# Patient Record
Sex: Male | Born: 1945 | Race: White | Hispanic: No | Smoking: Former smoker
Health system: Southern US, Community
[De-identification: ages and names within clinical notes are randomized; demographics above are authoritative.]

## PROBLEM LIST (undated history)

## (undated) DIAGNOSIS — R05 Cough: Secondary | ICD-10-CM

## (undated) DIAGNOSIS — K769 Liver disease, unspecified: Secondary | ICD-10-CM

## (undated) DIAGNOSIS — R499 Unspecified voice and resonance disorder: Secondary | ICD-10-CM

## (undated) DIAGNOSIS — N529 Male erectile dysfunction, unspecified: Secondary | ICD-10-CM

## (undated) DIAGNOSIS — J189 Pneumonia, unspecified organism: Secondary | ICD-10-CM

## (undated) DIAGNOSIS — R911 Solitary pulmonary nodule: Secondary | ICD-10-CM

## (undated) DIAGNOSIS — M1990 Unspecified osteoarthritis, unspecified site: Secondary | ICD-10-CM

## (undated) DIAGNOSIS — I10 Essential (primary) hypertension: Secondary | ICD-10-CM

## (undated) DIAGNOSIS — I480 Paroxysmal atrial fibrillation: Secondary | ICD-10-CM

## (undated) DIAGNOSIS — F329 Major depressive disorder, single episode, unspecified: Secondary | ICD-10-CM

## (undated) DIAGNOSIS — D369 Benign neoplasm, unspecified site: Secondary | ICD-10-CM

## (undated) DIAGNOSIS — R0981 Nasal congestion: Secondary | ICD-10-CM

## (undated) DIAGNOSIS — I2510 Atherosclerotic heart disease of native coronary artery without angina pectoris: Secondary | ICD-10-CM

## (undated) DIAGNOSIS — F32A Depression, unspecified: Secondary | ICD-10-CM

## (undated) DIAGNOSIS — Z9889 Other specified postprocedural states: Secondary | ICD-10-CM

## (undated) DIAGNOSIS — R059 Cough, unspecified: Secondary | ICD-10-CM

## (undated) DIAGNOSIS — E785 Hyperlipidemia, unspecified: Secondary | ICD-10-CM

## (undated) DIAGNOSIS — R112 Nausea with vomiting, unspecified: Secondary | ICD-10-CM

## (undated) DIAGNOSIS — E079 Disorder of thyroid, unspecified: Secondary | ICD-10-CM

## (undated) DIAGNOSIS — E039 Hypothyroidism, unspecified: Secondary | ICD-10-CM

## (undated) HISTORY — DX: Depression, unspecified: F32.A

## (undated) HISTORY — DX: Liver disease, unspecified: K76.9

## (undated) HISTORY — DX: Atherosclerotic heart disease of native coronary artery without angina pectoris: I25.10

## (undated) HISTORY — DX: Essential (primary) hypertension: I10

## (undated) HISTORY — DX: Major depressive disorder, single episode, unspecified: F32.9

## (undated) HISTORY — DX: Unspecified osteoarthritis, unspecified site: M19.90

## (undated) HISTORY — DX: Cough, unspecified: R05.9

## (undated) HISTORY — DX: Unspecified voice and resonance disorder: R49.9

## (undated) HISTORY — DX: Pneumonia, unspecified organism: J18.9

## (undated) HISTORY — DX: Hyperlipidemia, unspecified: E78.5

## (undated) HISTORY — DX: Male erectile dysfunction, unspecified: N52.9

## (undated) HISTORY — DX: Benign neoplasm, unspecified site: D36.9

## (undated) HISTORY — DX: Solitary pulmonary nodule: R91.1

## (undated) HISTORY — DX: Cough: R05

## (undated) HISTORY — DX: Nasal congestion: R09.81

## (undated) HISTORY — DX: Disorder of thyroid, unspecified: E07.9

---

## 1997-09-04 ENCOUNTER — Encounter: Admission: RE | Admit: 1997-09-04 | Discharge: 1997-09-04 | Payer: Self-pay | Admitting: *Deleted

## 1997-12-08 ENCOUNTER — Emergency Department (HOSPITAL_COMMUNITY): Admission: EM | Admit: 1997-12-08 | Discharge: 1997-12-09 | Payer: Self-pay | Admitting: Emergency Medicine

## 1997-12-08 ENCOUNTER — Encounter: Payer: Self-pay | Admitting: Emergency Medicine

## 1999-08-03 ENCOUNTER — Encounter: Payer: Self-pay | Admitting: Family Medicine

## 1999-08-03 ENCOUNTER — Encounter: Admission: RE | Admit: 1999-08-03 | Discharge: 1999-08-03 | Payer: Self-pay | Admitting: Family Medicine

## 2001-08-19 ENCOUNTER — Ambulatory Visit (HOSPITAL_COMMUNITY): Admission: RE | Admit: 2001-08-19 | Discharge: 2001-08-19 | Payer: Self-pay | Admitting: Family Medicine

## 2001-08-19 ENCOUNTER — Encounter: Payer: Self-pay | Admitting: Family Medicine

## 2002-11-03 ENCOUNTER — Emergency Department (HOSPITAL_COMMUNITY): Admission: EM | Admit: 2002-11-03 | Discharge: 2002-11-03 | Payer: Self-pay | Admitting: Emergency Medicine

## 2003-01-12 ENCOUNTER — Encounter: Admission: RE | Admit: 2003-01-12 | Discharge: 2003-01-12 | Payer: Self-pay | Admitting: Orthopaedic Surgery

## 2003-01-15 ENCOUNTER — Ambulatory Visit (HOSPITAL_BASED_OUTPATIENT_CLINIC_OR_DEPARTMENT_OTHER): Admission: RE | Admit: 2003-01-15 | Discharge: 2003-01-15 | Payer: Self-pay | Admitting: Orthopaedic Surgery

## 2003-01-15 ENCOUNTER — Ambulatory Visit (HOSPITAL_COMMUNITY): Admission: RE | Admit: 2003-01-15 | Discharge: 2003-01-15 | Payer: Self-pay | Admitting: Orthopaedic Surgery

## 2004-03-21 ENCOUNTER — Ambulatory Visit (HOSPITAL_COMMUNITY): Admission: RE | Admit: 2004-03-21 | Discharge: 2004-03-21 | Payer: Self-pay | Admitting: Otolaryngology

## 2004-03-21 ENCOUNTER — Encounter (INDEPENDENT_AMBULATORY_CARE_PROVIDER_SITE_OTHER): Payer: Self-pay | Admitting: *Deleted

## 2004-03-21 ENCOUNTER — Ambulatory Visit (HOSPITAL_BASED_OUTPATIENT_CLINIC_OR_DEPARTMENT_OTHER): Admission: RE | Admit: 2004-03-21 | Discharge: 2004-03-21 | Payer: Self-pay | Admitting: Otolaryngology

## 2005-07-14 ENCOUNTER — Encounter (INDEPENDENT_AMBULATORY_CARE_PROVIDER_SITE_OTHER): Payer: Self-pay | Admitting: *Deleted

## 2005-07-14 ENCOUNTER — Ambulatory Visit (HOSPITAL_COMMUNITY): Admission: RE | Admit: 2005-07-14 | Discharge: 2005-07-14 | Payer: Self-pay | Admitting: Gastroenterology

## 2008-07-16 ENCOUNTER — Emergency Department (HOSPITAL_COMMUNITY): Admission: EM | Admit: 2008-07-16 | Discharge: 2008-07-16 | Payer: Self-pay | Admitting: Emergency Medicine

## 2008-09-22 ENCOUNTER — Emergency Department (HOSPITAL_COMMUNITY): Admission: EM | Admit: 2008-09-22 | Discharge: 2008-09-22 | Payer: Self-pay | Admitting: Emergency Medicine

## 2008-10-16 ENCOUNTER — Emergency Department (HOSPITAL_COMMUNITY): Admission: EM | Admit: 2008-10-16 | Discharge: 2008-10-16 | Payer: Self-pay | Admitting: Emergency Medicine

## 2008-10-19 ENCOUNTER — Ambulatory Visit (HOSPITAL_COMMUNITY): Admission: RE | Admit: 2008-10-19 | Discharge: 2008-10-19 | Payer: Self-pay | Admitting: Family Medicine

## 2008-10-21 ENCOUNTER — Ambulatory Visit (HOSPITAL_COMMUNITY): Admission: RE | Admit: 2008-10-21 | Discharge: 2008-10-21 | Payer: Self-pay | Admitting: Family Medicine

## 2008-10-24 ENCOUNTER — Ambulatory Visit (HOSPITAL_COMMUNITY): Admission: RE | Admit: 2008-10-24 | Discharge: 2008-10-24 | Payer: Self-pay | Admitting: Emergency Medicine

## 2008-11-17 ENCOUNTER — Other Ambulatory Visit: Admission: RE | Admit: 2008-11-17 | Discharge: 2008-11-17 | Payer: Self-pay | Admitting: Interventional Radiology

## 2008-11-17 ENCOUNTER — Encounter: Admission: RE | Admit: 2008-11-17 | Discharge: 2008-11-17 | Payer: Self-pay | Admitting: Otolaryngology

## 2008-11-17 ENCOUNTER — Encounter (INDEPENDENT_AMBULATORY_CARE_PROVIDER_SITE_OTHER): Payer: Self-pay | Admitting: Interventional Radiology

## 2009-12-23 ENCOUNTER — Ambulatory Visit (HOSPITAL_COMMUNITY)
Admission: RE | Admit: 2009-12-23 | Discharge: 2009-12-23 | Payer: Self-pay | Source: Home / Self Care | Admitting: Family Medicine

## 2010-04-30 LAB — POCT I-STAT, CHEM 8
BUN: 10 mg/dL (ref 6–23)
Calcium, Ion: 1.03 mmol/L — ABNORMAL LOW (ref 1.12–1.32)
Chloride: 102 mEq/L (ref 96–112)
HCT: 45 % (ref 39.0–52.0)
Sodium: 138 mEq/L (ref 135–145)

## 2010-04-30 LAB — DIFFERENTIAL
Basophils Absolute: 0 10*3/uL (ref 0.0–0.1)
Basophils Relative: 0 % (ref 0–1)
Eosinophils Absolute: 0 10*3/uL (ref 0.0–0.7)
Eosinophils Relative: 0 % (ref 0–5)
Lymphs Abs: 0.8 10*3/uL (ref 0.7–4.0)
Monocytes Absolute: 0.6 10*3/uL (ref 0.1–1.0)
Monocytes Relative: 8 % (ref 3–12)

## 2010-04-30 LAB — CBC
MCHC: 34.2 g/dL (ref 30.0–36.0)
MCV: 91.9 fL (ref 78.0–100.0)
Platelets: 138 10*3/uL — ABNORMAL LOW (ref 150–400)
RBC: 4.78 MIL/uL (ref 4.22–5.81)
WBC: 7.3 10*3/uL (ref 4.0–10.5)

## 2010-04-30 LAB — RAPID STREP SCREEN (MED CTR MEBANE ONLY): Streptococcus, Group A Screen (Direct): NEGATIVE

## 2010-06-10 NOTE — Op Note (Signed)
NAMEVERLON, PISCHKE NO.:  [id number]   MEDICAL RECORD NO.:  [id number]          PATIENT TYPE:  AMB   LOCATION:  ENDO                         FACILITY:  [facility]   PHYSICIAN:  James L. Malon Kindle., M.D.DATE OF BIRTH:  1945/09/26   DATE OF PROCEDURE:  07/14/2005  DATE OF DISCHARGE:                                 OPERATIVE REPORT   PROCEDURE:  Colonoscopy and polypectomy.   MEDICATIONS:  Fentanyl 100 mcg, Versed 8 mg IV.   INDICATIONS FOR PROCEDURE:  Strong family history of colon cancer in her  brother who was diagnosed at 58.  This is done to evaluate for colonic  neoplasia.   DESCRIPTION OF PROCEDURE:  The procedure was explained to the patient and  consent obtained.  With the patient in the left lateral decubitus position,  the Olympus pediatric scope was inserted and advanced.  The prep was  adequate to advance.  The cecum, ileocecal valve, and appendiceal orifice  were seen.  The scope was withdrawn.  The cecum, ascending, and transverse  colon were seen well and were unremarkable.  In the mid-descending colon, a  2-mm sessile polyp was biopsied.  The sigmoid colon was free of polyps.  No  diverticulosis.  In the rectum, four polyps were seen.  All 3 to 5 mm.  All  sessile.  All removed with the snare, sucked through the scope, and placed  in a single jar.  No bleeding seen at the polypectomy site.  Scope  withdrawn.  The patient tolerated the procedure well.   ASSESSMENT:  1.  Multiple rectal polyps removed by snare polypectomy.  211.4.  2.  Strong family history of colon cancer.  V16.0.   PLAN:  Will recommend repeating colonoscopy in five years, routine post-  polypectomy instructions.           ______________________________  Llana Aliment Malon Kindle., M.D.     Waldron Session  D:  07/14/2005  T:  07/14/2005  Job:  [id number]   cc:   Oley Balm. Georgina Pillion, M.D.  Fax: [phone]

## 2010-06-10 NOTE — Op Note (Signed)
Christopher Patel, Christopher Patel                 ACCOUNT NO.:  [id number]   MEDICAL RECORD NO.:  [id number]          PATIENT TYPE:  AMB   LOCATION:  DSC                          FACILITY:  [facility]   PHYSICIAN:  Jefry H. Pollyann Kennedy, MD     DATE OF BIRTH:  12-15-1945   DATE OF PROCEDURE:  03/21/2004  DATE OF DISCHARGE:                                 OPERATIVE REPORT   PREOPERATIVE DIAGNOSIS:  Right neck and left chest wall cyst.   POSTOPERATIVE DIAGNOSIS:  Right neck and left chest wall cyst.   PROCEDURE:  Excision of right neck and left chest wall cysts.   SURGEON:  Jefry H. Pollyann Kennedy, M>D.   Local anesthesia was used with monitored anesthesia care/sedation.  No  complications. No blood loss.   FINDINGS:  Both cysts with pinpoint openings to the skin.  Apparent  epidermoid-type cysts.  Both removed in their entirety without rupture and  without any molestation of underlying structures.  Both specimens sent for  pathologic evaluation.   HISTORY:  This is a 65 year old with a history of enlarging the skin  growths, right neck and left upper chest wall.  Risks, benefits, alternatives, and complications of the procedure were  explained to the patient, who seemed to understand and agreed to surgery.   PROCEDURE:  The patient was taken to the operating room, placed on the  operating table in supine position.  Following induction of intravenous  sedation, the neck and chest were prepped and draped in standard fashion.  Xylocaine 1% with epinephrine was infiltrated into the base of both lesions.  A marking pen was used outline the proposed incisions prior to injecting.  A  15 scalpel was used to incise the ellipse of skin around both lesions.  The  neck lesion was more superficial within the dermis, and the entire skin was  resected.  The chest wall lesion, a smaller ellipse of skin was removed and  the remainder was preserved.  Careful sharp dissection was accomplished on  both lesions, and the entire lesion  was dissected from surrounding  subcutaneous tissue.  Lesions were both sent for pathologic evaluation.  The wounds were closed using 5-0chromic suture on the neck lesion, just  several subcuticular sutures, and on the chest lesion some deeper sutures to  reapproximate the tissues and close off the dead space and then subcuticular  sutures for closure of the skin.  Benzoin and Steri-Strips were applied  bilaterally.  The patient was then transferred to recovery in stable  condition.      JHR/MEDQ  D:  03/21/2004  T:  03/21/2004  Job:  [id number]

## 2010-06-10 NOTE — Op Note (Signed)
NAMEMOREY, ANDONIAN                           ACCOUNT NO.:  [id number]   MEDICAL RECORD NO.:  [id number]                   PATIENT TYPE:  AMB   LOCATION:  DSC                                  FACILITY:  [facility]   PHYSICIAN:  Claude Manges. Cleophas Dunker, M.D.            DATE OF BIRTH:  09-11-1945   DATE OF PROCEDURE:  01/15/2003  DATE OF DISCHARGE:  01/15/2003                                 OPERATIVE REPORT   PREOPERATIVE DIAGNOSES:  1. Tricompartmental degenerative joint disease, right knee.  2. Lateral compartment loose body.   POSTOPERATIVE DIAGNOSES:  1. Tricompartmental degenerative joint disease, right knee.  2. Lateral compartment loose body.  3. Tear of the lateral meniscoid meniscus.   PROCEDURES:  1. Partial lateral meniscectomy.  2. Removal of loose body.  3. Shaving of patella.   SURGEON:  Claude Manges. Cleophas Dunker, M.D.   ANESTHESIA:  General orotracheal.   COMPLICATIONS:  None.   HISTORY:  A 65 year old gentleman is complaining of right knee pain for  approximately two months.  He has felt a catching sensation in his knee and  developed some swelling that caused him to lose about a week's worth of  work.  He has been using a knee sleeve but feels like his knee keeps giving  away and something is moving as it catches.  He underwent a right knee  arthroscopy approximately 13 years ago and now has evidence of a loose body  in the lateral compartment in addition to the degenerative joint disease in  all three compartments.  Because he is experiencing mechanical symptoms, he  is to have an arthroscopy.   DESCRIPTION OF PROCEDURE:  With the patient comfortable on the operating  table and under general anesthetic, the right lower extremity was placed in  the thigh holder.  The leg was then prepped with Duraprep from the thigh  holder to the ankle.  Sterile draping was performed.   Diagnostic arthroscopy was performed using a medial and lateral parapatellar  tendon puncture site  after infiltration with 0.25% Marcaine with  epinephrine.  Diagnostic arthroscopy revealed very minimal clear yellow  effusion.  There was some minimal synovitis in the superior pouch.  There  was chondromalacia diffusely, probably grade 2 changes in the patella.  These were shaved.  There was a large loose body that measured probably 1 x  1 cm in the lateral gutter.  This was forced into the superior pouch with  the arthroscope in the lateral compartment, then removed with a grabber  retractor through the medial incision after enlarging the incision to allow  for the size of the loose body.   The medial compartment revealed little if any chondromalacia.  The meniscus  was intact.  There was some fraying of the anterior cruciate ligament.  It  was thinned, and there were some areas of synovitis.  He had negative  anterior drawer sign.   The  lateral compartment revealed more diffuse chondromalacia of the femoral  and tibial surfaces with at least grade 3 changes, and these were shaved.  There was a meniscoid-appearing lateral meniscus with a tear at the junction  of the middle and posterior thirds, and this was carefully debrided with a  basket forceps and then the intra-articular shaver such that the meniscus  was now nicely tapered anterior to posterior.  There was also a very small  loose body attached to the synovium along the posterior horn of the lateral  meniscus, which was also removed.   The joint was then copiously irrigated with saline solution.  I inspected it  for any evidence of loose material and there was none.   The medial incision was closed with an interrupted 4-0 Ethilon.  Marcaine  0.25% with epinephrine injected into both incisions.  A sterile bulky  dressing was applied, followed by an Ace bandage.   PLAN:  Percocet for pain.  Office, 10 days.                                               Claude Manges. Cleophas Dunker, M.D.    PWW/MEDQ  D:  01/15/2003  T:  01/17/2003   Job:  [id number]

## 2010-07-14 ENCOUNTER — Other Ambulatory Visit: Payer: Self-pay | Admitting: Surgery

## 2010-08-11 ENCOUNTER — Other Ambulatory Visit: Payer: Self-pay | Admitting: Gastroenterology

## 2010-08-26 ENCOUNTER — Inpatient Hospital Stay (INDEPENDENT_AMBULATORY_CARE_PROVIDER_SITE_OTHER)
Admission: RE | Admit: 2010-08-26 | Discharge: 2010-08-26 | Disposition: A | Discharge status: Home / Self Care | Payer: 59 | Source: Ambulatory Visit | Attending: Family Medicine | Admitting: Family Medicine

## 2010-08-26 DIAGNOSIS — M545 Low back pain: Secondary | ICD-10-CM

## 2010-11-24 HISTORY — PX: EYE SURGERY: SHX253

## 2010-12-24 HISTORY — PX: EYE SURGERY: SHX253

## 2011-02-07 ENCOUNTER — Other Ambulatory Visit (HOSPITAL_COMMUNITY): Payer: Self-pay | Admitting: Family Medicine

## 2011-02-07 DIAGNOSIS — R911 Solitary pulmonary nodule: Secondary | ICD-10-CM

## 2011-02-14 ENCOUNTER — Ambulatory Visit (HOSPITAL_COMMUNITY)
Admission: RE | Admit: 2011-02-14 | Discharge: 2011-02-14 | Disposition: A | Discharge status: Home / Self Care | Payer: 59 | Source: Ambulatory Visit | Attending: Family Medicine | Admitting: Family Medicine

## 2011-02-14 DIAGNOSIS — I2510 Atherosclerotic heart disease of native coronary artery without angina pectoris: Secondary | ICD-10-CM | POA: Insufficient documentation

## 2011-02-14 DIAGNOSIS — E0789 Other specified disorders of thyroid: Secondary | ICD-10-CM | POA: Insufficient documentation

## 2011-02-14 DIAGNOSIS — R911 Solitary pulmonary nodule: Secondary | ICD-10-CM

## 2011-02-14 DIAGNOSIS — K5710 Diverticulosis of small intestine without perforation or abscess without bleeding: Secondary | ICD-10-CM | POA: Insufficient documentation

## 2011-02-14 DIAGNOSIS — R059 Cough, unspecified: Secondary | ICD-10-CM | POA: Insufficient documentation

## 2011-02-14 DIAGNOSIS — R05 Cough: Secondary | ICD-10-CM | POA: Insufficient documentation

## 2011-02-20 ENCOUNTER — Other Ambulatory Visit (HOSPITAL_COMMUNITY): Payer: Self-pay | Admitting: Family Medicine

## 2011-02-20 DIAGNOSIS — E041 Nontoxic single thyroid nodule: Secondary | ICD-10-CM

## 2011-03-06 ENCOUNTER — Ambulatory Visit (HOSPITAL_COMMUNITY)
Admission: RE | Admit: 2011-03-06 | Discharge: 2011-03-06 | Disposition: A | Discharge status: Home / Self Care | Payer: 59 | Source: Ambulatory Visit | Attending: Family Medicine | Admitting: Family Medicine

## 2011-03-06 DIAGNOSIS — E041 Nontoxic single thyroid nodule: Secondary | ICD-10-CM

## 2011-03-06 DIAGNOSIS — E079 Disorder of thyroid, unspecified: Secondary | ICD-10-CM | POA: Insufficient documentation

## 2011-03-22 ENCOUNTER — Encounter: Payer: 59 | Attending: Family Medicine | Admitting: *Deleted

## 2011-03-23 ENCOUNTER — Encounter: Payer: Self-pay | Admitting: *Deleted

## 2011-03-23 NOTE — Progress Notes (Signed)
  Patient was seen on 03/22/11 for the first of a series of three diabetes self-management courses at the [facility]. The following learning objectives were met by the patient during this course:   Defines the role of glucose and insulin  Identifies type of diabetes and pathophysiology  Defines the diagnostic criteria for diabetes and prediabetes  States the risk factors for Type 2 Diabetes  States the symptoms of Type 2 Diabetes  Defines Type 2 Diabetes treatment goals  Defines Type 2 Diabetes treatment options  States the rationale for glucose monitoring  Identifies A1C, glucose targets, and testing times  Identifies proper sharps disposal  Defines the purpose of a diabetes food plan  Identifies carbohydrate food groups  Defines effects of carbohydrate foods on glucose levels  Identifies carbohydrate choices/grams/food labels  States benefits of physical activity and effect on glucose  Review of suggested activity guidelines  Last A1c: 9.8% (per [facility] 03/13/11)  Handouts given during class include:  Type 2 Diabetes: Basics Book  My Food Plan Book  Food and Activity Log  Patient has established the following initial goals:  Increase exercise.  Monitor blood glucose.  Follow a diabetes meal plan.  Lose weight.  Follow-Up Plan: Patient to attend Core Diabetes Courses II and III in April 2013.

## 2011-03-23 NOTE — Patient Instructions (Signed)
Patient will attend Core Diabetes Courses II and III as scheduled or follow up prn.  

## 2011-04-05 ENCOUNTER — Encounter (INDEPENDENT_AMBULATORY_CARE_PROVIDER_SITE_OTHER): Payer: Self-pay | Admitting: Surgery

## 2011-04-05 ENCOUNTER — Ambulatory Visit (INDEPENDENT_AMBULATORY_CARE_PROVIDER_SITE_OTHER): Payer: Commercial Managed Care - PPO | Admitting: Surgery

## 2011-04-05 VITALS — BP 146/82 | HR 68 | Temp 97.8°F | Resp 18 | Ht 69.0 in | Wt 188.4 lb

## 2011-04-05 DIAGNOSIS — D449 Neoplasm of uncertain behavior of unspecified endocrine gland: Secondary | ICD-10-CM

## 2011-04-05 DIAGNOSIS — E042 Nontoxic multinodular goiter: Secondary | ICD-10-CM | POA: Insufficient documentation

## 2011-04-05 NOTE — Patient Instructions (Signed)
Thyroid Diseases Your thyroid is a butterfly-shaped gland in your neck. It is located just above your collarbone. It is one of your endocrine glands, which make hormones. The thyroid helps set your metabolism. Metabolism is how your body gets energy from the foods you eat.  Millions of people have thyroid diseases. Women experience thyroid problems more often than men. In fact, overactive thyroid problems (hyperthyroidism) occur in 1% of all women. If you have a thyroid disease, your body may use energy more slowly or quickly than it should.  Thyroid problems also include an immune disease where your body reacts against your thyroid gland (called thyroiditis). A different problem involves lumps and bumps (called nodules) that develop in the gland. The nodules are usually, but not always, noncancerous. THE MOST COMMON THYROID PROBLEMS AND CAUSES ARE DISCUSSED BELOW There are many causes for thyroid problems. Treatment depends upon the exact diagnosis and includes trying to reset your body's metabolism to a normal rate. Hyperthyroidism Too much thyroid hormone from an overactive thyroid gland is called hyperthyroidism. In hyperthyroidism, the body's metabolism speeds up. One of the most frequent forms of hyperthyroidism is known as Graves' disease. Graves' disease tends to run in families. Although Graves' is thought to be caused by a problem with the immune system, the exact nature of the genetic problem is unknown. Hypothyroidism Too little thyroid hormone from an underactive thyroid gland is called hypothyroidism. In hypothyroidism, the body's metabolism is slowed. Several things can cause this condition. Most causes affect the thyroid gland directly and hurt its ability to make enough hormone.  Rarely, there may be a pituitary gland tumor (located near the base of the brain). The tumor can block the pituitary from producing thyroid-stimulating hormone (TSH). Your body makes TSH to stimulate the thyroid  to work properly. If the pituitary does not make enough TSH, the thyroid fails to make enough hormones needed for good health. Whether the problem is caused by thyroid conditions or by the pituitary gland, the result is that the thyroid is not making enough hormones. Hypothyroidism causes many physical and mental processes to become sluggish. The body consumes less oxygen and produces less body heat. Thyroid Nodules A thyroid nodule is a small swelling or lump in the thyroid gland. They are common. These nodules represent either a growth of thyroid tissue or a fluid-filled cyst. Both form a lump in the thyroid gland. Almost half of all people will have tiny thyroid nodules at some point in their lives. Typically, these are not noticeable until they become large and affect normal thyroid size. Larger nodules that are greater than a half inch across (about 1 centimeter) occur in about 5 percent of people. Although most nodules are not cancerous, people who have them should seek medical care to rule out cancer. Also, some thyroid nodules may produce too much thyroid hormone or become too large. Large nodules or a large gland can interfere with breathing or swallowing or may cause neck discomfort. Other problems Other thyroid problems include cancer and thyroiditis. Thyroiditis is a malfunction of the body's immune system. Normally, the immune system works to defend the body against infection and other problems. When the immune system is not working properly, it may mistakenly attack normal cells, tissues, and organs. Examples of autoimmune diseases are Hashimoto's thyroiditis (which causes low thyroid function) and Graves' disease (which causes excess thyroid function). SYMPTOMS  Symptoms vary greatly depending upon the exact type of problem with the thyroid. Hyperthyroidism-is when your thyroid is too   active and makes more thyroid hormone than your body needs. The most common cause is Graves' Disease. Too  much thyroid hormone can cause some or all of the following symptoms:  Anxiety.   Irritability.   Difficulty sleeping.   Fatigue.   A rapid or irregular heartbeat.   A fine tremor of your hands or fingers.   An increase in perspiration.   Sensitivity to heat.   Weight loss, despite normal food intake.   Brittle hair.   Enlargement of your thyroid gland (goiter).   Light menstrual periods.   Frequent bowel movements.  Graves' disease can specifically cause eye and skin problems. The skin problems involve reddening and swelling of the skin, often on your shins and on the top of your feet. Eye problems can include the following:  Excess tearing and sensation of grit or sand in either or both eyes.   Reddened or inflamed eyes.   Widening of the space between your eyelids.   Swelling of the lids and tissues around the eyes.   Light sensitivity.   Ulcers on the cornea.   Double vision.   Limited eye movements.   Blurred or reduced vision.  Hypothyroidism- is when your thyroid gland is not active enough. This is more common than hyperthyroidism. Symptoms can vary a lot depending of the severity of the hormone deficiency. Symptoms may develop over a long period of time and can include several of the following:  Fatigue.   Sluggishness.   Increased sensitivity to cold.   Constipation.   Pale, dry skin.   A puffy face.   Hoarse voice.   High blood cholesterol level.   Unexplained weight gain.   Muscle aches, tenderness and stiffness.   Pain, stiffness or swelling in your joints.   Muscle weakness.   Heavier than normal menstrual periods.   Brittle fingernails and hair.   Depression.  Thyroid Nodules - most do not cause signs or symptoms. Occasionally, some may become so large that you can feel or even see the swelling at the base of your neck. You may realize a lump or swelling is there when you are shaving or putting on makeup. Men might become  aware of a nodule when shirt collars suddenly feel too tight. Some nodules produce too much thyroid hormone. This can produce the same symptoms as hyperthyroidism (see above). Thyroid nodules are seldom cancerous. However, a nodule is more likely to be malignant (cancerous) if it:  Grows quickly or feels hard.   Causes you to become hoarse or to have trouble swallowing or breathing.   Causes enlarged lymph nodes under your jaw or in your neck.  DIAGNOSIS  Because there are so many possible thyroid conditions, your caregiver may ask for a number of tests. They will do this in order to narrow down the exact diagnosis. These tests can include:  Blood and antibody tests.   Special thyroid scans using small, safe amounts of radioactive iodine.   Ultrasound of the thyroid gland (particularly if there is a nodule or lump).   Biopsy. This is usually done with a special needle. A needle biopsy is a procedure to obtain a sample of cells from the thyroid. The tissue will be tested in a lab and examined under a microscope.  TREATMENT  Treatment depends on the exact diagnosis. Hyperthyroidism  Beta-blockers help relieve many of the symptoms.   Anti-thyroid medications prevent the thyroid from making excess hormones.   Radioactive iodine treatment can destroy overactive thyroid   cells. The iodine can permanently decrease the amount of hormone produced.   Surgery to remove the thyroid gland.   Treatments for eye problems that come from Graves' disease also include medications and special eye surgery, if felt to be appropriate.  Hypothyroidism Thyroid replacement with levothyroxine is the mainstay of treatment. Treatment with thyroid replacement is usually lifelong and will require monitoring and adjustment from time to time. Thyroid Nodules  Watchful waiting. If a small nodule causes no symptoms or signs of cancer on biopsy, then no treatment may be chosen at first. Re-exam and re-checking blood  tests would be the recommended follow-up.   Anti-thyroid medications or radioactive iodine treatment may be recommended if the nodules produce too much thyroid hormone (see Treatment for Hyperthyroidism above).   Alcohol ablation. Injections of small amounts of ethyl alcohol (ethanol) can cause a non-cancerous nodule to shrink in size.   Surgery (see Treatment for Hyperthyroidism above).  HOME CARE INSTRUCTIONS   Take medications as instructed.   Follow through on recommended testing.  SEEK MEDICAL CARE IF:   You feel that you are developing symptoms of Hyperthyroidism or Hypothyroidism as described above.   You develop a new lump/nodule in the neck/thyroid area that you had not noticed before.   You feel that you are having side effects from medicines prescribed.   You develop trouble breathing or swallowing.  SEEK IMMEDIATE MEDICAL CARE IF:   You develop a fever of 102 F (38.9 C) or higher.   You develop severe sweating.   You develop palpitations and/or rapid heart beat.   You develop shortness of breath.   You develop nausea and vomiting.   You develop extreme shakiness.   You develop agitation.   You develop lightheadedness or have a fainting episode.  Document Released: 11/06/2006 Document Revised: 12/29/2010 Document Reviewed: 11/06/2006 [employer] Patient Information 2012 [employer]. 

## 2011-04-05 NOTE — Progress Notes (Signed)
Chief Complaint  Patient presents with  . New Evaluation    Thyroid nodule - referral from Dr. Darrow Bussing, [facility] [place]    HISTORY: Patient is a 66 year old white male employed in the maintenance department at the hospital who presents on referral from his primary physician for evaluation of enlarging right thyroid nodule. Thyroid nodules had been diagnosed as an incidental finding approximately 2 years ago. Patient undergone a CT scan as followup of pulmonary nodule. He has a history of smoking. Thyroid nodule underwent fine needle aspiration biopsy in 2010. This showed a follicular lesion which appeared benign, consistent with a hyperplastic thyroid nodule. Recent CT scan in January 2013 showed enlargement of the dominant right thyroid nodule. Patient subsequently underwent repeat thyroid ultrasound and February 2013. The shows an enlarged right thyroid lobe measuring 7 cm in greatest dimension, a normal left thyroid lobe, a dominant right thyroid nodule measuring 5.1 cm which has enlarged since the prior study. There is also a 7 mm nodule in the inferior pole of the left thyroid lobe.  Patient does note some hoarseness. He notes feeling cold at times. He has had no prior head or neck surgery. There is a history of thyroid disease in the patient's mother and brother, but no history of thyroid cancer. There is no family history of other endocrinopathy.  Laboratory studies from January 2013 show a normal TSH level of 1.90  Past Medical History  Diagnosis Date  . Diabetes mellitus   . Hyperlipidemia   . Hypertension   . CAD (coronary artery disease)   . Depression   . ED (erectile dysfunction)   . Osteoarthritis   . Lung nodule   . Hepatic lesion     left lobe  . Tubular adenoma     polyps- Dr.Edwards  . Pneumonia   . Nasal congestion   . Change in voice   . Cough   . Thyroid disease     thyroid lesion     Current Outpatient Prescriptions  Medication Sig Dispense Refill    . amLODipine (NORVASC) 5 MG tablet Take 5 mg by mouth daily.      Marland Kitchen aspirin 81 MG tablet Take 81 mg by mouth daily.      Marland Kitchen ENSURE (ENSURE) Take 237 mLs by mouth.      . fexofenadine (ALLEGRA) 180 MG tablet Take 180 mg by mouth daily.      Marland Kitchen lisinopril-hydrochlorothiazide (PRINZIDE,ZESTORETIC) 20-25 MG per tablet Take 1 tablet by mouth daily.      . metFORMIN (GLUCOPHAGE) 500 MG tablet Take 1,000 mg by mouth 2 (two) times daily with a meal.      . metoprolol (LOPRESSOR) 100 MG tablet Take 100 mg by mouth daily.      . metoprolol succinate (TOPROL-XL) 100 MG 24 hr tablet Take 100 mg by mouth daily. Take with or immediately following a meal.      . rosuvastatin (CRESTOR) 20 MG tablet Take 20 mg by mouth daily.      . sildenafil (VIAGRA) 25 MG tablet Take 25 mg by mouth daily as needed.         Allergies  Allergen Reactions  . Codeine Itching  . Actos (Pioglitazone Hydrochloride) Nausea Only  . Glyburide     hypoglycemia  . Metformin And Related     Hypoglycemia, shakes     Family History  Problem Relation Age of Onset  . COPD Other   . Hypertension Other   . Hyperlipidemia Other   .  Heart attack Other   . Cancer Brother     colon     History   Social History  . Marital Status: Single    Spouse Name: N/A    Number of Children: N/A  . Years of Education: N/A   Social History Main Topics  . Smoking status: Never Smoker   . Smokeless tobacco: Never Used  . Alcohol Use: Yes     Beer on weekends, 6 or more  . Drug Use: No  . Sexually Active: None   Other Topics Concern  . None   Social History Narrative  . None     REVIEW OF SYSTEMS - PERTINENT POSITIVES ONLY: Moderate intermittent hoarseness, feeling cold frequently, no other compressive symptoms.  EXAM: Filed Vitals:   04/05/11 0942  BP: 146/82  Pulse: 68  Temp: 97.8 F (36.6 C)  Resp: 18    HEENT: normocephalic; pupils equal and reactive; sclerae clear; dentition good; mucous membranes  moist NECK:  Palpation shows a dominant right thyroid nodule measuring at least 5 cm in size and extending beneath the right clavicle. This is nontender. Isthmus and left lobe are without palpable abnormality; asymmetric on extension; no palpable anterior or posterior cervical lymphadenopathy; no supraclavicular masses; no tenderness CHEST: clear to auscultation bilaterally without rales, rhonchi, or wheezes CARDIAC: regular rate and rhythm without significant murmur; peripheral pulses are full ABDOMEN: soft without distension; bowel sounds present; no mass; no hepatosplenomegaly; no hernia EXT:  non-tender without edema; no deformity NEURO: no gross focal deficits; mild tremor   LABORATORY RESULTS: See [facility] (CHL-Epic) for most recent results   RADIOLOGY RESULTS: See [facility] ([facility]L-Epic) for most recent results   IMPRESSION: #1 dominant right thyroid nodule, 5.1 cm, enlarging, with follicular cytopathology #2 multinodular thyroid goiter, nontoxic  PLAN: The patient and I discussed all the above issues. We discussed the options for management. Over the past few years the patient has undergone close observation. Certainly he could continue to observe this nodule with followup laboratory studies, follow up ultrasound, and followup physical examination in 6 months. Alternatively, the patient could undergo total thyroidectomy.  We discussed the procedure of total thyroidectomy. We discussed the benefits and the risk. We discussed the need for lifelong thyroid hormone replacement. We discussed potential complications of surgery including recurrent nerve injury and injury to parathyroid glands. We discussed the hospital stay to be expected in the postoperative recovery and return to work. The patient understands and wishes to proceed.  The risks and benefits of the procedure have been discussed at length with the patient.  The patient understands the proposed procedure, potential  alternative treatments, and the course of recovery to be expected.  All of the patient's questions have been answered at this time.  The patient wishes to proceed with surgery and will schedule a date for the procedure through our office staff.  Velora Heckler, MD, FACS General & Endocrine Surgery [facility]   Visit Diagnoses: 1. 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

## 2011-04-12 ENCOUNTER — Telehealth (INDEPENDENT_AMBULATORY_CARE_PROVIDER_SITE_OTHER): Payer: Self-pay

## 2011-04-12 NOTE — Telephone Encounter (Signed)
C/o hoarse voice, with nasal drip. Patient advised to contact primary care provider.  He wanted to know Dr. Gerrit Friends can recommend anything for treatment of condition.  He will be having thyroid surgery 05/02/2011.

## 2011-04-15 ENCOUNTER — Telehealth (INDEPENDENT_AMBULATORY_CARE_PROVIDER_SITE_OTHER): Payer: Self-pay | Admitting: General Surgery

## 2011-04-15 NOTE — Telephone Encounter (Signed)
He called and stated that recently, he did not have a lot of energy and he wondered if it was related to his thyroid goiter.  He is due to have a total thyroidectomy by Dr. Gerrit Friends next month for this.  He stated that he was not taking thyroid medication.  I told him that I was not convinced that this was related to his thyroid goiter and that he should call his PCP.

## 2011-04-20 ENCOUNTER — Encounter (HOSPITAL_COMMUNITY): Payer: Self-pay | Admitting: Pharmacy Technician

## 2011-04-25 ENCOUNTER — Other Ambulatory Visit: Payer: Self-pay

## 2011-04-25 ENCOUNTER — Encounter (HOSPITAL_COMMUNITY)
Admission: RE | Admit: 2011-04-25 | Discharge: 2011-04-25 | Disposition: A | Discharge status: Home / Self Care | Payer: 59 | Source: Ambulatory Visit | Attending: Surgery | Admitting: Surgery

## 2011-04-25 ENCOUNTER — Encounter (HOSPITAL_COMMUNITY): Payer: Self-pay | Admitting: Pharmacy Technician

## 2011-04-25 ENCOUNTER — Encounter (HOSPITAL_COMMUNITY): Payer: Self-pay

## 2011-04-25 DIAGNOSIS — M1990 Unspecified osteoarthritis, unspecified site: Secondary | ICD-10-CM

## 2011-04-25 HISTORY — PX: CARDIAC CATHETERIZATION: SHX172

## 2011-04-25 HISTORY — DX: Unspecified osteoarthritis, unspecified site: M19.90

## 2011-04-25 HISTORY — PX: KNEE SURGERY: SHX244

## 2011-04-25 LAB — CBC
HCT: 44.9 % (ref 39.0–52.0)
Hemoglobin: 14.8 g/dL (ref 13.0–17.0)
MCHC: 33 g/dL (ref 30.0–36.0)
RDW: 12.8 % (ref 11.5–15.5)
WBC: 6.7 10*3/uL (ref 4.0–10.5)

## 2011-04-25 LAB — BASIC METABOLIC PANEL
Chloride: 100 mEq/L (ref 96–112)
GFR calc Af Amer: >90 mL/min (ref >90)
GFR calc non Af Amer: >90 mL/min (ref >90)
Potassium: 3.9 mEq/L (ref 3.5–5.1)

## 2011-04-25 LAB — SURGICAL PCR SCREEN
MRSA, PCR: NEGATIVE
Staphylococcus aureus: NEGATIVE

## 2011-04-25 NOTE — Pre-Procedure Instructions (Signed)
04-25-11 EKG done today. CT Chest (02-14-11) in Epic.W. Kennon Portela

## 2011-04-25 NOTE — Patient Instructions (Addendum)
20 Christopher Patel  04/25/2011   Your procedure is scheduled on:  4-9 -2013  Report to [facility] at   0730     AM.  Call this number if you have problems the morning of surgery: [phone]   Remember:   Do not eat food:After Midnight.  Take these medicines the morning of surgery with A SIP OF WATER: Amlodipine, Allegra, Crestor.   Do not wear jewelry, make-up or nail polish.  Do not wear lotions, powders, or perfumes. You may wear deodorant.  Do not shave 48 hours prior to surgery.(face and neck okay, no shaving of legs)  Do not bring valuables to the hospital.  Contacts, dentures or bridgework may not be worn into surgery.  Leave suitcase in the car. After surgery it may be brought to your room.  For patients admitted to the hospital, checkout time is 11:00 AM the day of discharge.   Patients discharged the day of surgery will not be allowed to drive home.  Name and phone number of your driver: family  Special Instructions: CHG Shower Use Special Wash: 1/2 bottle night before surgery and 1/2 bottle morning of surgery.(avoid face and genitals)   Please read over the following fact sheets that you were given: MRSA Information.

## 2011-04-27 NOTE — Progress Notes (Signed)
Quick Note:  These results are acceptable for scheduled surgery. TMG ______ 

## 2011-04-28 NOTE — Progress Notes (Signed)
Faxed to  

## 2011-05-02 ENCOUNTER — Encounter (HOSPITAL_COMMUNITY)
Admission: RE | Disposition: A | Discharge status: Home / Self Care | Payer: Self-pay | Source: Ambulatory Visit | Attending: Surgery

## 2011-05-02 ENCOUNTER — Ambulatory Visit (HOSPITAL_COMMUNITY): Payer: 59 | Admitting: Anesthesiology

## 2011-05-02 ENCOUNTER — Encounter (HOSPITAL_COMMUNITY): Payer: Self-pay | Admitting: Anesthesiology

## 2011-05-02 ENCOUNTER — Encounter (HOSPITAL_COMMUNITY): Payer: Self-pay | Admitting: *Deleted

## 2011-05-02 ENCOUNTER — Observation Stay (HOSPITAL_COMMUNITY)
Admission: RE | Admit: 2011-05-02 | Discharge: 2011-05-03 | Disposition: A | Discharge status: Home / Self Care | Payer: 59 | Source: Ambulatory Visit | Attending: Surgery | Admitting: Surgery

## 2011-05-02 DIAGNOSIS — I10 Essential (primary) hypertension: Secondary | ICD-10-CM | POA: Insufficient documentation

## 2011-05-02 DIAGNOSIS — E785 Hyperlipidemia, unspecified: Secondary | ICD-10-CM | POA: Insufficient documentation

## 2011-05-02 DIAGNOSIS — E119 Type 2 diabetes mellitus without complications: Secondary | ICD-10-CM | POA: Insufficient documentation

## 2011-05-02 DIAGNOSIS — Z01810 Encounter for preprocedural cardiovascular examination: Secondary | ICD-10-CM | POA: Insufficient documentation

## 2011-05-02 DIAGNOSIS — M1990 Unspecified osteoarthritis, unspecified site: Secondary | ICD-10-CM | POA: Insufficient documentation

## 2011-05-02 DIAGNOSIS — Z79899 Other long term (current) drug therapy: Secondary | ICD-10-CM | POA: Insufficient documentation

## 2011-05-02 DIAGNOSIS — E042 Nontoxic multinodular goiter: Principal | ICD-10-CM | POA: Insufficient documentation

## 2011-05-02 DIAGNOSIS — I2510 Atherosclerotic heart disease of native coronary artery without angina pectoris: Secondary | ICD-10-CM | POA: Insufficient documentation

## 2011-05-02 DIAGNOSIS — D34 Benign neoplasm of thyroid gland: Secondary | ICD-10-CM

## 2011-05-02 DIAGNOSIS — Z01812 Encounter for preprocedural laboratory examination: Secondary | ICD-10-CM | POA: Insufficient documentation

## 2011-05-02 HISTORY — PX: THYROIDECTOMY: SHX17

## 2011-05-02 LAB — GLUCOSE, CAPILLARY
Glucose-Capillary: 138 mg/dL — ABNORMAL HIGH (ref 70–99)
Glucose-Capillary: 149 mg/dL — ABNORMAL HIGH (ref 70–99)
Glucose-Capillary: 195 mg/dL — ABNORMAL HIGH (ref 70–99)
Glucose-Capillary: 234 mg/dL — ABNORMAL HIGH (ref 70–99)

## 2011-05-02 SURGERY — THYROIDECTOMY
Anesthesia: General | Site: Neck | Wound class: Clean

## 2011-05-02 MED ORDER — LACTATED RINGERS IV SOLN
INTRAVENOUS | Status: DC
  Administered 2011-05-02: 12:00:00 via INTRAVENOUS
  Administered 2011-05-02: 1000 mL via INTRAVENOUS

## 2011-05-02 MED ORDER — HYDROMORPHONE HCL PF 1 MG/ML IJ SOLN
INTRAMUSCULAR | Status: AC
  Administered 2011-05-02: 1 mg via INTRAVENOUS
  Filled 2011-05-02: qty 1

## 2011-05-02 MED ORDER — FLUTICASONE FUROATE 27.5 MCG/SPRAY NA SUSP: 2.0000 | Freq: Every day | NASAL | Status: DC

## 2011-05-02 MED ORDER — MIDAZOLAM HCL 5 MG/5ML IJ SOLN
INTRAMUSCULAR | Status: DC | PRN
  Administered 2011-05-02: 2 mg via INTRAVENOUS

## 2011-05-02 MED ORDER — PROMETHAZINE HCL 25 MG/ML IJ SOLN: 6.2500 mg | INTRAMUSCULAR | Status: DC | PRN

## 2011-05-02 MED ORDER — EPHEDRINE SULFATE 50 MG/ML IJ SOLN
INTRAMUSCULAR | Status: DC | PRN
  Administered 2011-05-02: 5 mg via INTRAVENOUS
  Administered 2011-05-02: 10 mg via INTRAVENOUS

## 2011-05-02 MED ORDER — FLUTICASONE PROPIONATE 50 MCG/ACT NA SUSP
2.0000 | Freq: Every day | NASAL | Status: DC
  Administered 2011-05-02 – 2011-05-03 (×2): 2 via NASAL
  Filled 2011-05-02: qty 16

## 2011-05-02 MED ORDER — NEOSTIGMINE METHYLSULFATE 1 MG/ML IJ SOLN
INTRAMUSCULAR | Status: DC | PRN
  Administered 2011-05-02: 5 mg via INTRAVENOUS

## 2011-05-02 MED ORDER — ACETAMINOPHEN 10 MG/ML IV SOLN
INTRAVENOUS | Status: DC | PRN
  Administered 2011-05-02: 1000 mg via INTRAVENOUS

## 2011-05-02 MED ORDER — ONDANSETRON HCL 4 MG PO TABS: 4.0000 mg | ORAL_TABLET | Freq: Four times a day (QID) | ORAL | Status: DC | PRN

## 2011-05-02 MED ORDER — CALCIUM CARBONATE 1250 (500 CA) MG PO TABS
1250.0000 mg | ORAL_TABLET | Freq: Three times a day (TID) | ORAL | Status: DC
  Administered 2011-05-02 – 2011-05-03 (×3): 1250 mg via ORAL
  Filled 2011-05-02 (×5): qty 1

## 2011-05-02 MED ORDER — HYDROCHLOROTHIAZIDE 25 MG PO TABS
25.0000 mg | ORAL_TABLET | Freq: Every day | ORAL | Status: DC
  Administered 2011-05-03: 25 mg via ORAL
  Filled 2011-05-02: qty 1

## 2011-05-02 MED ORDER — ONDANSETRON HCL 4 MG/2ML IJ SOLN
INTRAMUSCULAR | Status: AC
  Administered 2011-05-02: 4 mg via INTRAVENOUS
  Filled 2011-05-02: qty 2

## 2011-05-02 MED ORDER — PROPOFOL 10 MG/ML IV EMUL
INTRAVENOUS | Status: DC | PRN
  Administered 2011-05-02: 200 mg via INTRAVENOUS

## 2011-05-02 MED ORDER — ACETAMINOPHEN 10 MG/ML IV SOLN
INTRAVENOUS | Status: AC
  Filled 2011-05-02: qty 100

## 2011-05-02 MED ORDER — AMLODIPINE BESYLATE 5 MG PO TABS
5.0000 mg | ORAL_TABLET | Freq: Every day | ORAL | Status: DC
  Administered 2011-05-03: 5 mg via ORAL
  Filled 2011-05-02: qty 1

## 2011-05-02 MED ORDER — LISINOPRIL-HYDROCHLOROTHIAZIDE 20-25 MG PO TABS: 1.0000 | ORAL_TABLET | Freq: Every day | ORAL | Status: DC

## 2011-05-02 MED ORDER — 0.9 % SODIUM CHLORIDE (POUR BTL) OPTIME
TOPICAL | Status: DC | PRN
  Administered 2011-05-02: 1000 mL

## 2011-05-02 MED ORDER — GLYCOPYRROLATE 0.2 MG/ML IJ SOLN
INTRAMUSCULAR | Status: DC | PRN
  Administered 2011-05-02: .8 mg via INTRAVENOUS

## 2011-05-02 MED ORDER — METFORMIN HCL ER 500 MG PO TB24
1000.0000 mg | ORAL_TABLET | Freq: Two times a day (BID) | ORAL | Status: DC
  Administered 2011-05-02 – 2011-05-03 (×3): 1000 mg via ORAL
  Filled 2011-05-02 (×4): qty 2

## 2011-05-02 MED ORDER — DEXAMETHASONE SODIUM PHOSPHATE 10 MG/ML IJ SOLN
INTRAMUSCULAR | Status: DC | PRN
  Administered 2011-05-02: 10 mg via INTRAVENOUS

## 2011-05-02 MED ORDER — ACETAMINOPHEN 325 MG PO TABS: 650.0000 mg | ORAL_TABLET | ORAL | Status: DC | PRN

## 2011-05-02 MED ORDER — FENTANYL CITRATE 0.05 MG/ML IJ SOLN
INTRAMUSCULAR | Status: DC | PRN
  Administered 2011-05-02 (×3): 50 ug via INTRAVENOUS

## 2011-05-02 MED ORDER — ONDANSETRON HCL 4 MG/2ML IJ SOLN
4.0000 mg | Freq: Four times a day (QID) | INTRAMUSCULAR | Status: DC | PRN
  Administered 2011-05-02 (×2): 4 mg via INTRAVENOUS
  Filled 2011-05-02: qty 2

## 2011-05-02 MED ORDER — CEFAZOLIN SODIUM-DEXTROSE 2-3 GM-% IV SOLR
INTRAVENOUS | Status: AC
  Filled 2011-05-02: qty 50

## 2011-05-02 MED ORDER — HYDROMORPHONE HCL PF 1 MG/ML IJ SOLN
1.0000 mg | INTRAMUSCULAR | Status: DC | PRN
  Administered 2011-05-02: 1 mg via INTRAVENOUS

## 2011-05-02 MED ORDER — HYDROMORPHONE HCL PF 1 MG/ML IJ SOLN
0.2500 mg | INTRAMUSCULAR | Status: DC | PRN
  Administered 2011-05-02 (×3): 0.5 mg via INTRAVENOUS

## 2011-05-02 MED ORDER — HYDROMORPHONE HCL PF 1 MG/ML IJ SOLN
INTRAMUSCULAR | Status: AC
  Filled 2011-05-02: qty 2

## 2011-05-02 MED ORDER — ROCURONIUM BROMIDE 100 MG/10ML IV SOLN
INTRAVENOUS | Status: DC | PRN
  Administered 2011-05-02: 5 mg via INTRAVENOUS
  Administered 2011-05-02: 50 mg via INTRAVENOUS

## 2011-05-02 MED ORDER — KCL IN DEXTROSE-NACL 20-5-0.45 MEQ/L-%-% IV SOLN
INTRAVENOUS | Status: DC
  Administered 2011-05-02: 17:00:00 via INTRAVENOUS
  Filled 2011-05-02 (×4): qty 1000

## 2011-05-02 MED ORDER — LISINOPRIL 20 MG PO TABS
20.0000 mg | ORAL_TABLET | Freq: Every day | ORAL | Status: DC
  Administered 2011-05-03: 20 mg via ORAL
  Filled 2011-05-02: qty 1

## 2011-05-02 MED ORDER — CEFAZOLIN SODIUM-DEXTROSE 2-3 GM-% IV SOLR
2.0000 g | Freq: Once | INTRAVENOUS | Status: AC
  Administered 2011-05-02: 2 g via INTRAVENOUS

## 2011-05-02 MED ORDER — ONDANSETRON HCL 4 MG/2ML IJ SOLN
INTRAMUSCULAR | Status: DC | PRN
  Administered 2011-05-02: 4 mg via INTRAVENOUS

## 2011-05-02 MED ORDER — HYDROCODONE-ACETAMINOPHEN 5-325 MG PO TABS
1.0000 | ORAL_TABLET | ORAL | Status: DC | PRN
  Administered 2011-05-02 – 2011-05-03 (×3): 1 via ORAL
  Administered 2011-05-03 (×2): 2 via ORAL
  Filled 2011-05-02 (×2): qty 1
  Filled 2011-05-02: qty 2
  Filled 2011-05-02: qty 1
  Filled 2011-05-02: qty 2

## 2011-05-02 MED ORDER — LIDOCAINE HCL (CARDIAC) 20 MG/ML IV SOLN
INTRAVENOUS | Status: DC | PRN
  Administered 2011-05-02: 80 mg via INTRAVENOUS

## 2011-05-02 SURGICAL SUPPLY — 39 items
ATTRACTOMAT 16X20 MAGNETIC DRP (DRAPES) ×2 IMPLANT
BENZOIN TINCTURE PRP APPL 2/3 (GAUZE/BANDAGES/DRESSINGS) ×2 IMPLANT
BLADE HEX COATED 2.75 (ELECTRODE) ×2 IMPLANT
BLADE SURG 15 STRL LF DISP TIS (BLADE) ×1 IMPLANT
BLADE SURG 15 STRL SS (BLADE) ×1
CANISTER SUCTION 2500CC (MISCELLANEOUS) ×2 IMPLANT
CHLORAPREP W/TINT 10.5 ML (MISCELLANEOUS) ×2 IMPLANT
CLIP TI MEDIUM 6 (CLIP) ×6 IMPLANT
CLIP TI WIDE RED SMALL 6 (CLIP) ×4 IMPLANT
CLOTH BEACON ORANGE TIMEOUT ST (SAFETY) ×2 IMPLANT
CLSR STERI-STRIP ANTIMIC 1/2X4 (GAUZE/BANDAGES/DRESSINGS) ×2 IMPLANT
DISSECTOR ROUND CHERRY 3/8 STR (MISCELLANEOUS) IMPLANT
DRAPE PED LAPAROTOMY (DRAPES) ×2 IMPLANT
DRESSING SURGICEL FIBRLLR 1X2 (HEMOSTASIS) ×1 IMPLANT
DRSG SURGICEL FIBRILLAR 1X2 (HEMOSTASIS) ×2
ELECT REM PT RETURN 9FT ADLT (ELECTROSURGICAL) ×2
ELECTRODE REM PT RTRN 9FT ADLT (ELECTROSURGICAL) ×1 IMPLANT
GAUZE SPONGE 4X4 16PLY XRAY LF (GAUZE/BANDAGES/DRESSINGS) ×2 IMPLANT
GLOVE SURG ORTHO 8.0 STRL STRW (GLOVE) ×2 IMPLANT
GOWN STRL NON-REIN LRG LVL3 (GOWN DISPOSABLE) ×2 IMPLANT
GOWN STRL REIN XL XLG (GOWN DISPOSABLE) ×4 IMPLANT
KIT BASIN OR (CUSTOM PROCEDURE TRAY) ×2 IMPLANT
NS IRRIG 1000ML POUR BTL (IV SOLUTION) ×2 IMPLANT
PACK BASIC VI WITH GOWN DISP (CUSTOM PROCEDURE TRAY) ×2 IMPLANT
PENCIL BUTTON HOLSTER BLD 10FT (ELECTRODE) ×2 IMPLANT
SHEARS HARMONIC 9CM CVD (BLADE) ×2 IMPLANT
SPONGE GAUZE 4X4 12PLY (GAUZE/BANDAGES/DRESSINGS) ×2 IMPLANT
STAPLER VISISTAT 35W (STAPLE) ×2 IMPLANT
STRIP CLOSURE SKIN 1/2X4 (GAUZE/BANDAGES/DRESSINGS) ×2 IMPLANT
SUT MNCRL AB 4-0 PS2 18 (SUTURE) ×2 IMPLANT
SUT SILK 2 0 (SUTURE) ×1
SUT SILK 2-0 18XBRD TIE 12 (SUTURE) ×1 IMPLANT
SUT SILK 3 0 (SUTURE)
SUT SILK 3-0 18XBRD TIE 12 (SUTURE) IMPLANT
SUT VIC AB 3-0 SH 18 (SUTURE) ×4 IMPLANT
SYR BULB IRRIGATION 50ML (SYRINGE) ×2 IMPLANT
TAPE CLOTH SURG 4X10 WHT LF (GAUZE/BANDAGES/DRESSINGS) ×2 IMPLANT
TOWEL OR 17X26 10 PK STRL BLUE (TOWEL DISPOSABLE) ×2 IMPLANT
YANKAUER SUCT BULB TIP 10FT TU (MISCELLANEOUS) ×2 IMPLANT

## 2011-05-02 NOTE — H&P (View-Only) (Signed)
Chief Complaint  Patient presents with  . New Evaluation    Thyroid nodule - referral from Dr. 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 Thyroid nodules had been diagnosed as an incidental finding approximately 2 years ago. Patient undergone a CT scan as followup of pulmonary nodule. He has a history of smoking. Thyroid nodule underwent fine needle aspiration biopsy in 2010. This showed a follicular lesion which appeared benign, consistent with a hyperplastic thyroid nodule. Recent CT scan in January 2013 showed enlargement of the dominant right thyroid nodule. Patient subsequently underwent repeat thyroid ultrasound and February 2013. The shows an enlarged right thyroid lobe measuring 7 cm in greatest dimension, a normal left thyroid lobe, a dominant right thyroid nodule measuring 5.1 cm which has enlarged since the prior study. There is also a 7 mm nodule in the inferior pole of the left thyroid lobe.  Patient does note some hoarseness. He notes feeling cold at times. He has had no prior head or neck surgery. There is a history of thyroid disease in the patient's mother and brother, but no history of thyroid cancer. There is no family history of other endocrinopathy.  Laboratory studies from January 2013 show a normal TSH level of 1.90  Past Medical History  Diagnosis Date  . Diabetes mellitus   . Hyperlipidemia   . Hypertension   . CAD (coronary artery disease)   . Depression   . ED (erectile dysfunction)   . Osteoarthritis   . Lung nodule   . Hepatic lesion     left lobe  . Tubular adenoma     polyps- Dr.Edwards  . Pneumonia   . Nasal congestion   . Change in voice   . Cough   . Thyroid disease     thyroid lesion     Current Outpatient Prescriptions  Medication Sig Dispense Refill    . amLODipine (NORVASC) 5 MG tablet Take 5 mg by mouth daily.      Marland Kitchen aspirin 81 MG tablet Take 81 mg by mouth daily.      Marland Kitchen ENSURE (ENSURE) Take 237 mLs by mouth.      . fexofenadine (ALLEGRA) 180 MG tablet Take 180 mg by mouth daily.      Marland Kitchen lisinopril-hydrochlorothiazide (PRINZIDE,ZESTORETIC) 20-25 MG per tablet Take 1 tablet by mouth daily.      . metFORMIN (GLUCOPHAGE) 500 MG tablet Take 1,000 mg by mouth 2 (two) times daily with a meal.      . metoprolol (LOPRESSOR) 100 MG tablet Take 100 mg by mouth daily.      . metoprolol succinate (TOPROL-XL) 100 MG 24 hr tablet Take 100 mg by mouth daily. Take with or immediately following a meal.      . rosuvastatin (CRESTOR) 20 MG tablet Take 20 mg by mouth daily.      . sildenafil (VIAGRA) 25 MG tablet Take 25 mg by mouth daily as needed.         Allergies  Allergen Reactions  . Codeine Itching  . Actos (Pioglitazone Hydrochloride) Nausea Only  . Glyburide     hypoglycemia  . Metformin And Related     Hypoglycemia, shakes     Family History  Problem Relation Age of Onset  . COPD Other   . Hypertension Other   . Hyperlipidemia Other   .  Heart attack Other   . Cancer Brother     colon     History   Social History  . Marital Status: Single    Spouse Name: N/A    Number of Children: N/A  . Years of Education: N/A   Social History Main Topics  . Smoking status: Never Smoker   . Smokeless tobacco: Never Used  . Alcohol Use: Yes     Beer on weekends, 6 or more  . Drug Use: No  . Sexually Active: None   Other Topics Concern  . None   Social History Narrative  . None     REVIEW OF SYSTEMS - PERTINENT POSITIVES ONLY: Moderate intermittent hoarseness, feeling cold frequently, no other compressive symptoms.  EXAM: Filed Vitals:   04/05/11 0942  BP: 146/82  Pulse: 68  Temp: 97.8 F (36.6 C)  Resp: 18    HEENT: normocephalic; pupils equal and reactive; sclerae clear; dentition good; mucous membranes  moist NECK:  Palpation shows a dominant right thyroid nodule measuring at least 5 cm in size and extending beneath the right clavicle. This is nontender. Isthmus and left lobe are without palpable abnormality; asymmetric on extension; no palpable anterior or posterior cervical lymphadenopathy; no supraclavicular masses; no tenderness CHEST: clear to auscultation bilaterally without rales, rhonchi, or wheezes CARDIAC: regular rate and rhythm without significant murmur; peripheral pulses are full ABDOMEN: soft without distension; bowel sounds present; no mass; no hepatosplenomegaly; no hernia EXT:  non-tender without edema; no deformity NEURO: no gross focal deficits; mild tremor   LABORATORY RESULTS: See [facility] (CHL-Epic) for most recent results   RADIOLOGY RESULTS: See [facility] (CHL-Epic) for most recent results   IMPRESSION: #1 dominant right thyroid nodule, 5.1 cm, enlarging, with follicular cytopathology #2 multinodular thyroid goiter, nontoxic  PLAN: The patient and I discussed all the above issues. We discussed the options for management. Over the past few years the patient has undergone close observation. Certainly he could continue to observe this nodule with followup laboratory studies, follow up ultrasound, and followup physical examination in 6 months. Alternatively, the patient could undergo total thyroidectomy.  We discussed the procedure of total thyroidectomy. We discussed the benefits and the risk. We discussed the need for lifelong thyroid hormone replacement. We discussed potential complications of surgery including recurrent nerve injury and injury to parathyroid glands. We discussed the hospital stay to be expected in the postoperative recovery and return to work. The patient understands and wishes to proceed.  The risks and benefits of the procedure have been discussed at length with the patient.  The patient understands the proposed procedure, potential  alternative treatments, and the course of recovery to be expected.  All of the patient's questions have been answered at this time.  The patient wishes to proceed with surgery and will schedule a date for the procedure through our office staff.  Velora Heckler, MD, FACS General & Endocrine Surgery [facility]   Visit Diagnoses: 1. 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

## 2011-05-02 NOTE — Brief Op Note (Signed)
05/02/2011  12:04 PM  PATIENT:  Christopher Patel  66 y.o. male  PRE-OPERATIVE DIAGNOSIS:  THYROID NODULES  POST-OPERATIVE DIAGNOSIS:  THYROID NODULES  PROCEDURE:  Procedure(s) (LRB): THYROIDECTOMY (N/A)  SURGEON:  Surgeon(s) and Role:    * Velora Heckler, MD - Primary  ANESTHESIA:   general  EBL:  Total I/O In: 1000 [I.V.:1000] Out: 10 [Blood:10]  BLOOD ADMINISTERED:none  DRAINS: none   LOCAL MEDICATIONS USED:  NONE  SPECIMEN:  Excision  DISPOSITION OF SPECIMEN:  PATHOLOGY  COUNTS:  YES  TOURNIQUET:  * No tourniquets in log *  DICTATION: .Other Dictation: Dictation Number [id number]  PLAN OF CARE: Admit for overnight observation  PATIENT DISPOSITION:  PACU - hemodynamically stable.   Delay start of Pharmacological VTE agent (>24hrs) due to surgical blood loss or risk of bleeding: yes  Velora Heckler, MD, [facility] Office: [phone]

## 2011-05-02 NOTE — Anesthesia Postprocedure Evaluation (Signed)
  Anesthesia Post-op Note  Patient: Christopher Patel  Procedure(s) Performed: Procedure(s) (LRB): THYROIDECTOMY (N/A)  Patient Location: PACU  Anesthesia Type: General  Level of Consciousness: awake and alert   Airway and Oxygen Therapy: Patient Spontanous Breathing  Post-op Pain: mild  Post-op Assessment: Post-op Vital signs reviewed, Patient's Cardiovascular Status Stable, Respiratory Function Stable, Patent Airway and No signs of Nausea or vomiting  Post-op Vital Signs: stable  Complications: No apparent anesthesia complications

## 2011-05-02 NOTE — Transfer of Care (Signed)
Immediate Anesthesia Transfer of Care Note  Patient: Christopher Patel  Procedure(s) Performed: Procedure(s) (LRB): THYROIDECTOMY (N/A)  Patient Location: PACU  Anesthesia Type: General  Level of Consciousness: awake, alert , oriented and patient cooperative  Airway & Oxygen Therapy: Patient Spontanous Breathing and Patient connected to face mask oxygen  Post-op Assessment: Report given to PACU RN, Post -op Vital signs reviewed and stable and Patient moving all extremities X 4  Post vital signs: Reviewed and stable  Complications: No apparent anesthesia complications

## 2011-05-02 NOTE — Interval H&P Note (Signed)
History and Physical Interval Note:  05/02/2011 10:02 AM  Christopher Patel  has presented today for surgery, with the diagnosis of THYROID NODULES.  The various methods of treatment have been discussed with the patient and family. After consideration of risks, benefits and other options for treatment, the patient has consented to    Procedure(s) (LRB): THYROIDECTOMY (N/A) as a surgical intervention .    The patients' history has been reviewed, patient examined, no change in status, stable for surgery.  I have reviewed the patients' chart and labs.  Questions were answered to the patient's satisfaction.    Velora Heckler, MD, FACS General & Endocrine Surgery [facility] Office: [phone]   [name]

## 2011-05-02 NOTE — Anesthesia Preprocedure Evaluation (Addendum)
Anesthesia Evaluation  Patient identified by MRN, date of birth, ID band Patient awake    Reviewed: Allergy & Precautions, H&P , NPO status , Patient's Chart, lab work & pertinent test results  Airway Mallampati: II TM Distance: >3 FB Neck ROM: Full    Dental No notable dental hx.    Pulmonary pneumonia , former smoker Quit smoking a year ago. breath sounds clear to auscultation  Pulmonary exam normal       Cardiovascular Exercise Tolerance: Good hypertension, Pt. on home beta blockers and Pt. on medications + CAD Rhythm:Regular Rate:Normal  Denies CAD. Had catheterization many years ago which was OK. Strong family history. Very active.   Neuro/Psych PSYCHIATRIC DISORDERS Depression negative neurological ROS     GI/Hepatic negative GI ROS, Neg liver ROS,   Endo/Other  Diabetes mellitus-, Type 2, Oral Hypoglycemic Agents  Renal/GU negative Renal ROS  negative genitourinary   Musculoskeletal negative musculoskeletal ROS (+)   Abdominal   Peds negative pediatric ROS (+)  Hematology negative hematology ROS (+)   Anesthesia Other Findings   Reproductive/Obstetrics negative OB ROS                          Anesthesia Physical Anesthesia Plan  ASA: III  Anesthesia Plan: General   Post-op Pain Management:    Induction: Intravenous  Airway Management Planned: Oral ETT  Additional Equipment:   Intra-op Plan:   Post-operative Plan: Extubation in OR  Informed Consent: I have reviewed the patients History and Physical, chart, labs and discussed the procedure including the risks, benefits and alternatives for the proposed anesthesia with the patient or authorized representative who has indicated his/her understanding and acceptance.   Dental advisory given  Plan Discussed with: CRNA  Anesthesia Plan Comments:         Anesthesia Quick Evaluation

## 2011-05-02 NOTE — Preoperative (Signed)
Beta Blockers   Reason not to administer Beta Blockers:Not Applicable 

## 2011-05-03 LAB — BASIC METABOLIC PANEL
CO2: 29 mEq/L (ref 19–32)
Calcium: 9 mg/dL (ref 8.4–10.5)
Chloride: 100 mEq/L (ref 96–112)
GFR calc Af Amer: >90 mL/min (ref >90)
Sodium: 137 mEq/L (ref 135–145)

## 2011-05-03 LAB — GLUCOSE, CAPILLARY
Glucose-Capillary: 210 mg/dL — ABNORMAL HIGH (ref 70–99)
Glucose-Capillary: 159 mg/dL — ABNORMAL HIGH (ref 70–99)

## 2011-05-03 MED ORDER — CALCIUM CARBONATE 1250 (500 CA) MG PO TABS: 2.0000 | ORAL_TABLET | Freq: Two times a day (BID) | ORAL | Status: DC

## 2011-05-03 MED ORDER — SYNTHROID 125 MCG PO TABS: 125.0000 ug | ORAL_TABLET | Freq: Every day | ORAL | Status: DC

## 2011-05-03 MED ORDER — HYDROCODONE-ACETAMINOPHEN 5-325 MG PO TABS: 1.0000 | ORAL_TABLET | ORAL | Status: AC | PRN

## 2011-05-03 NOTE — Discharge Summary (Signed)
Physician Discharge Summary [facility]  Patient ID: Christopher Patel MRN: [id number] DOB/AGE: 1945/10/16 66 y.o.  Admit date: 05/02/2011 Discharge date: 05/03/2011  Admission Diagnoses:  Multinodular thyroid, follicular lesion   Discharge Diagnoses:  Active Problems:  * No active hospital problems. *    Discharged Condition: good  Hospital Course: patient admitted after surgery for observation.  Good pain control.  Calcium level 9.0 in AM following surgery.  Prepared for discharge home POD#1.  Consults: None  Significant Diagnostic Studies: labs: calcium  Treatments: surgery: total thyroidectomy  Discharge Exam: Blood pressure 155/87, pulse 84, temperature 97.8 F (36.6 C), temperature source Oral, resp. rate 18, height 5\' 9"  (1.753 m), weight 189 lb 9.5 oz (86 kg), SpO2 95.00%. HEENT - clear Neck - wound clear and dry; voice normal Chest - clear Cor - RRR  Disposition: Home with family  Discharge Orders    Future Appointments: Provider: Department: Dept Phone: Center:   05/04/2011 9:00 AM [facility] Core Class 2 [facility] [id number] NDM   05/11/2011 9:00 AM [facility] Core Class 3 [facility] [phone] NDM   05/17/2011 11:15 AM Velora Heckler, MD [facility] [phone] None     Future Orders Please Complete By Expires   Diet - low sodium heart healthy      Increase activity slowly      Discharge instructions      Comments:   [facility], [place] [phone]  THYROID & PARATHYROID SURGERY -- POST OP INSTRUCTIONS  Always review your discharge instruction sheet from the facility where your surgery was performed.  A prescription for pain medication may be given to you upon discharge.  Take your pain medication as prescribed, if needed.  If narcotic pain medicine is not needed, then you may take acetaminophen (Tylenol) or ibuprofen (Advil) as needed. Take your usually prescribed medications unless otherwise  directed. If you need a refill on your pain medication, please contact your pharmacy. They will contact our office to request authorization.  Prescriptions will not be processed after 5 pm or on weekends. Start with a light diet upon arrival home, such as soup and crackers, etc.  Be sure to drink pleny of fluids daily.  Resume your normal diet the day after surgery. Most patients will experience some swelling and bruising on the chest and neck area.  Ice packs will help.  Swelling and bruising can take several days to resolve.  It is common to experience some constipation if taking pain medication after surgery.  Increasing fluid intake and taking a stool softener will usually help or prevent this problem.  A mild laxative (Milk of Magnesia or Miralax) should be taken according to package directions if there are no bowel movements after 48 hours. You may remove your bandages 24-48 hours after surgery, and you may shower at that time.  You have steri-strips (small skin tapes) in place directly over the incision.  These strips should be left on the skin for 7-10 days and then removed. You may resume regular (light) daily activities beginning the next day--such as daily self-care, walking, climbing stairs--gradually increasing activities as tolerated.  You may have sexual intercourse when it is comfortable.  Refrain from any heavy lifting or straining until approved by your doctor.  You may drive when you no longer are taking prescription pain medication, you can comfortably wear a seatbelt, and you can safely maneuver your car and apply brakes. You should see your doctor  in the office for a follow-up appointment approximately two weeks after your surgery.  Make sure that you call for this appointment within a day or two after you arrive home to insure a convenient appointment time.  WHEN TO CALL YOUR DOCTOR: Fever over 101.5 Inability to urinate Nausea and/or vomiting - persistent Extreme swelling or  bruising Continued bleeding from incision Increased pain, redness, or drainage from the incision Difficulty swallowing or breathing Muscle cramping or spasms Numbness or tingling in hands or feet or around lips  The clinic staff is available to answer your questions during regular business hours.  Please don't hesitate to call and ask to speak to one of the nurses if you have concerns.  [email or web address]    Remove dressing in 24 hours      Comments:   May shower in 24 hours. tmg   Apply dressing        Medication List  As of 05/03/2011  8:45 AM   TAKE these medications         amLODipine 5 MG tablet   Commonly known as: NORVASC   Take 5 mg by mouth daily with breakfast.      aspirin 81 MG tablet   Take 81 mg by mouth daily with breakfast.      calcium carbonate 1250 MG tablet   Commonly known as: OS-CAL - dosed in mg of elemental calcium   Take 2 tablets (1,000 mg of elemental calcium total) by mouth 2 (two) times daily.      fexofenadine 180 MG tablet   Commonly known as: ALLEGRA   Take 180 mg by mouth daily as needed. Allergies        fluticasone 27.5 MCG/SPRAY nasal spray   Commonly known as: VERAMYST   Place 2 sprays into the nose daily.      HYDROcodone-acetaminophen 5-325 MG per tablet   Commonly known as: NORCO   Take 1-2 tablets by mouth every 4 (four) hours as needed.      lisinopril-hydrochlorothiazide 20-25 MG per tablet   Commonly known as: PRINZIDE,ZESTORETIC   Take 1 tablet by mouth daily with breakfast.      metFORMIN 500 MG 24 hr tablet   Commonly known as: GLUCOPHAGE-XR   Take 1,000 mg by mouth 2 (two) times daily.      mulitivitamin with minerals Tabs   Take 1 tablet by mouth daily.      rosuvastatin 20 MG tablet   Commonly known as: CRESTOR   Take 20 mg by mouth daily with breakfast.      sildenafil 25 MG tablet   Commonly known as: VIAGRA   Take 25 mg by mouth daily as needed.      SYNTHROID 125 MCG tablet   Generic  drug: levothyroxine   Take 1 tablet (125 mcg total) by mouth daily.           Velora Heckler, MD, [facility] Office: [phone]    Signed: Velora Heckler 05/03/2011, 8:45 AM

## 2011-05-03 NOTE — Op Note (Signed)
[name], Patel NO.:  [id number]  MEDICAL RECORD NO.:  [id number]  LOCATION:  1504                         FACILITY:  [facility]  PHYSICIAN:  Velora Heckler, MD      DATE OF BIRTH:  [birth year]  DATE OF PROCEDURE: 02 May 2011                               OPERATIVE REPORT   POSTOPERATIVE DIAGNOSIS:  Follicular thyroid nodule.  POSTOPERATIVE DIAGNOSIS:  Follicular thyroid nodule.  PROCEDURE:  Total thyroidectomy.  SURGEON:  Velora Heckler, MD, FACS  ANESTHESIA:  General.  ANESTHESIOLOGIST:  Quentin Cornwall. Council Mechanic, M.D.  ESTIMATED BLOOD LOSS:  Minimal.  PREPARATION:  ChloraPrep.  COMPLICATIONS:  None.  INDICATIONS:  The patient is a 66 year old white male, employed in the maintenance department at the hospital, who presents on referral from his primary care physician, with an enlarging right thyroid nodule. This had been noted on CT scan as an incidental finding 2 years ago. Fine-needle aspiration biopsy was performed.  This showed a follicular lesion.  Followup CT scan and ultrasound showed progressive enlargement of the nodule.  Also was interval development of additional nodules in the left thyroid lobe.  The patient now comes to surgery for thyroidectomy for definitive diagnosis.  BODY OF REPORT:  Procedures done in OR #11 at the [facility].  The patient was brought to the operating room, placed in supine position on the operating room table.  Following administration of general anesthesia, the patient was positioned and then prepped and draped in the usual strict aseptic fashion.  After ascertaining that an adequate level of anesthesia has been achieved, a Kocher incision was made with a #15 blade.  Dissection was carried through subcutaneous tissues and platysma and hemostasis obtained with the electrocautery. Skin flaps were elevated cephalad and caudad from the thyroid notch to the sternal notch.  The [place] self-retaining  retractor was placed for exposure.  Strap muscles were incised in the midline.  Dissection was begun on the left side.  Strap muscles were reflected to the left exposing a relatively normal size left thyroid lobe.  Palpation shows small nodules, likely less than 1 cm, present within the left lobe.  Left lobe was gently mobilized with blunt dissection.  Superior pole vessels were dissected out and divided individually between small and medium Ligaclips with the Harmonic Scalpel.  Middle thyroid vein was divided between Ligaclips with the Harmonic Scalpel.  Inferior venous tributaries were divided between Ligaclips with the Harmonic Scalpel.  The gland was mobilized and rotated anteriorly onto the trachea.  Parathyroid tissue was identified and preserved.  Recurrent laryngeal nerve was identified and preserved. Branches of the inferior thyroid artery were divided between small Ligaclips with the Harmonic Scalpel.  Ligament of Allyson Sabal was released with the electrocautery in the left lobe was mobilized up and onto the anterior trachea.  Isthmus was mobilized across the midline.  There was no significant pyramidal lobe present.  Dry pack was placed in the left neck.  Next, we turned our attention to the right thyroid lobe.  Right lobe was markedly enlarged.  It was occupied by what appears to be a single large thyroid nodule.  Strap muscles  were reflected laterally.  Middle thyroid vein was divided between Ligaclips with the Harmonic Scalpel.  Superior pole vessels were dissected out and divided individually between medium Ligaclips with the Harmonic Scalpel.  Superior parathyroid gland was identified and preserved.  Inferior venous tributaries were divided between Ligaclips with the Harmonic Scalpel.  Inferior parathyroid tissue was also preserved.  Branches of the inferior thyroid artery were divided between small and medium Ligaclips with the Harmonic Scalpel.  A prominent recurrent  laryngeal nerve was identified and preserved. Ligament of Allyson Sabal was released with the electrocautery.  Gland was mobilized onto the anterior trachea from which it was completely excised with the electrocautery.  Specimen was marked with a suture at the right superior pole.  The entire thyroid gland was submitted to pathology for review.  Neck was irrigated with warm saline.  Good hemostasis was noted throughout.  Surgicel was placed throughout the operative field.  Strap muscles were reapproximated in the midline with interrupted 3-0 Vicryl sutures.  Platysma was closed with interrupted 3-0 Vicryl sutures.  Skin was closed with a running 4-0 Monocryl subcuticular suture.  Wound was washed and dried and benzoin and Steri-Strips were applied.  Sterile dressings were applied.  The patient was awakened from anesthesia and brought to the recovery room.  The patient tolerated the procedure well.   Velora Heckler, MD, FACS   TMG/MEDQ  D:  05/02/2011  T:  05/03/2011  Job:  [id number]  cc:   Darrow Bussing, MD Fax: [phone]

## 2011-05-04 ENCOUNTER — Other Ambulatory Visit (INDEPENDENT_AMBULATORY_CARE_PROVIDER_SITE_OTHER): Payer: Self-pay

## 2011-05-04 ENCOUNTER — Other Ambulatory Visit (INDEPENDENT_AMBULATORY_CARE_PROVIDER_SITE_OTHER): Payer: Self-pay | Admitting: Surgery

## 2011-05-04 ENCOUNTER — Ambulatory Visit: Payer: 59

## 2011-05-04 DIAGNOSIS — Z9889 Other specified postprocedural states: Secondary | ICD-10-CM

## 2011-05-04 NOTE — Progress Notes (Signed)
Patient aware of results.

## 2011-05-04 NOTE — Progress Notes (Signed)
Quick Note:  Please contact patient with benign path results. TMG ______ 

## 2011-05-11 ENCOUNTER — Ambulatory Visit: Payer: 59

## 2011-05-16 ENCOUNTER — Encounter (HOSPITAL_COMMUNITY): Payer: Self-pay | Admitting: Surgery

## 2011-05-17 ENCOUNTER — Encounter (INDEPENDENT_AMBULATORY_CARE_PROVIDER_SITE_OTHER): Payer: Self-pay

## 2011-05-17 ENCOUNTER — Encounter (INDEPENDENT_AMBULATORY_CARE_PROVIDER_SITE_OTHER): Payer: Self-pay | Admitting: Surgery

## 2011-05-17 ENCOUNTER — Ambulatory Visit (INDEPENDENT_AMBULATORY_CARE_PROVIDER_SITE_OTHER): Payer: Commercial Managed Care - PPO | Admitting: Surgery

## 2011-05-17 VITALS — BP 144/72 | HR 75 | Temp 97.0°F | Resp 18 | Ht 69.0 in | Wt 190.0 lb

## 2011-05-17 DIAGNOSIS — E890 Postprocedural hypothyroidism: Secondary | ICD-10-CM

## 2011-05-17 DIAGNOSIS — E042 Nontoxic multinodular goiter: Secondary | ICD-10-CM

## 2011-05-17 DIAGNOSIS — D449 Neoplasm of uncertain behavior of unspecified endocrine gland: Secondary | ICD-10-CM

## 2011-05-17 NOTE — Patient Instructions (Signed)
  COCOA BUTTER & VITAMIN E CREAM  ([employer] or other brand)  Apply cocoa butter/vitamin E cream to your incision 2 - 3 times daily.  Massage cream into incision for one minute with each application.  Use sunscreen (50 SPF or higher) for first 6 months after surgery if area is exposed to sun.  You may substitute Mederma or other scar reducing creams as desired.   

## 2011-05-17 NOTE — Progress Notes (Signed)
Visit Diagnoses: 1. Multinodular goiter (nontoxic)   2. 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 Hypothyroidism, postsurgical     HISTORY: Patient returns having undergone total thyroidectomy on May 02, 2011. Final pathology showed hyperplastic thyroid nodules with cystic degeneration. There was no atypia. There was no evidence of malignancy. Postoperative serum calcium level is normal at 8.5. Patient is taking Synthroid 125 mcg daily.  EXAM: Surgical wound is healing nicely. Mild to moderate soft tissue swelling. No seroma. No infection. Voice quality is normal.  IMPRESSION: Status post total thyroidectomy, benign final pathology  PLAN: Patient will begin applying topical creams his incision. He will discontinue calcium supplementation. We will check his TSH level and serum calcium level again in 4 weeks. He will return to see me for a wound check in 6 weeks.  Velora Heckler, MD, FACS General & Endocrine Surgery [facility]

## 2011-06-01 ENCOUNTER — Encounter: Payer: 59 | Attending: Family Medicine

## 2011-06-01 DIAGNOSIS — E119 Type 2 diabetes mellitus without complications: Secondary | ICD-10-CM | POA: Insufficient documentation

## 2011-06-01 DIAGNOSIS — Z713 Dietary counseling and surveillance: Secondary | ICD-10-CM | POA: Insufficient documentation

## 2011-06-13 NOTE — Progress Notes (Signed)
  Patient was seen on 06/01/2011 for the second of a series of three diabetes self-management courses at the [facility]. The following learning objectives were met by the patient during this course:   Explain basic nutrition maintenance and quality assurance  Describe causes, symptoms and treatment of hypoglycemia and hyperglycemia  Explain how to manage diabetes during illness  Describe the importance of good nutrition for health and healthy eating strategies  List strategies to follow meal plan when dining out  Describe the effects of alcohol on glucose and how to use it safely  Describe problem solving skills for day-to-day glucose challenges  Describe strategies to use when treatment plan needs to change  Identify important factors involved in successful weight loss  Describe ways to remain physically active  Describe the impact of regular activity on insulin resistance  Handouts given in class:  Refrigerator magnet for Sick Day Guidelines  [facility] Oral medication/insulin handout  Follow-Up Plan: Patient will attend the final class of the ADA Diabetes Self-Care Education.   

## 2011-06-15 ENCOUNTER — Encounter: Payer: 59 | Admitting: Dietician

## 2011-06-19 NOTE — Progress Notes (Signed)
  Patient was seen on 06/15/2011 for the third of a series of three diabetes self-management courses at the [facility]. The following learning objectives were met by the patient during this course:    Describe how diabetes changes over time   Identify diabetes complications and ways to prevent them   Describe strategies that can promote heart health including lowering blood pressure and cholesterol   Describe strategies to lower dietary fat and sodium in the diet   Identify physical activities that benefit cardiovascular health   Evaluate success in meeting personal goal   Describe the belief that they can live successfully with diabetes day to day   Establish 2-3 goals that they will plan to diligently work on until they return for the free 61-month follow-up visit  The following handouts were given in class:  3 Month Follow Up Visit handout  Goal setting handout  Class evaluation form  Your patient has established the following 3 month goals for diabetes self-care:  Count carbohydrates at most of my meals and snacks.  Increase my activity (for example, take the stairs) at least 3 days a week.   Be active 30 minutes or more four times a week.  Take my diabetes medicines as scheduled.  Quit smoking by 12/10/09.  Look for patterns in my record book at least 20 days a month.   To manage my stress, I will walk at least 5 times a week.  Test my glucose at least two times a day, 5 days a week.  Follow-Up Plan: Patient will attend a 3 month follow-up visit for diabetes self-management education.

## 2011-06-28 ENCOUNTER — Ambulatory Visit (INDEPENDENT_AMBULATORY_CARE_PROVIDER_SITE_OTHER): Payer: Commercial Managed Care - PPO | Admitting: Surgery

## 2011-06-28 ENCOUNTER — Other Ambulatory Visit (INDEPENDENT_AMBULATORY_CARE_PROVIDER_SITE_OTHER): Payer: Self-pay | Admitting: Surgery

## 2011-06-28 ENCOUNTER — Encounter (INDEPENDENT_AMBULATORY_CARE_PROVIDER_SITE_OTHER): Payer: Self-pay | Admitting: Surgery

## 2011-06-28 VITALS — BP 128/74 | HR 63 | Temp 97.2°F | Resp 14 | Ht 70.0 in | Wt 189.4 lb

## 2011-06-28 DIAGNOSIS — E890 Postprocedural hypothyroidism: Secondary | ICD-10-CM

## 2011-06-28 NOTE — Patient Instructions (Signed)
  COCOA BUTTER & VITAMIN E CREAM  ([employer] or other brand)  Apply cocoa butter/vitamin E cream to your incision 2 - 3 times daily.  Massage cream into incision for one minute with each application.  Use sunscreen (50 SPF or higher) for first 6 months after surgery if area is exposed to sun.  You may substitute Mederma or other scar reducing creams as desired.   

## 2011-06-28 NOTE — Progress Notes (Signed)
Visit Diagnoses: 1. Hypothyroidism, postsurgical     HISTORY: Patient returns for postoperative visit having undergone total thyroidectomy in April with benign final pathology. Patient is taking levothyroxine 125 mcg daily. No further laboratory studies have been obtained.  EXAM: Surgical wound is well-healed. Good cosmetic result. No palpable abnormalities. Voice quality is normal.  IMPRESSION: Status post total thyroidectomy for a benign multinodular gland. No evidence of malignancy.  PLAN: Patient will go to the laboratory today for a TSH level. I will review this and communicate the results to his primary physician. Patient will continue to apply topical creams to his incision. He will return to see me as needed.  Velora Heckler, MD, FACS General & Endocrine Surgery [facility]

## 2011-07-12 ENCOUNTER — Telehealth (INDEPENDENT_AMBULATORY_CARE_PROVIDER_SITE_OTHER): Payer: Self-pay

## 2011-07-12 NOTE — Telephone Encounter (Signed)
Copy of labs faxed to Dr Docia Chuck [phone] per Dr Ardine Eng request.

## 2011-07-12 NOTE — Telephone Encounter (Signed)
Labs to Dr Gerrit Friends to review and advise any follow up.

## 2011-07-24 ENCOUNTER — Telehealth (INDEPENDENT_AMBULATORY_CARE_PROVIDER_SITE_OTHER): Payer: Self-pay

## 2011-07-24 NOTE — Telephone Encounter (Signed)
Notified labs normal and copy faxed to PCP per Dr Sid Falcon request.

## 2011-08-07 ENCOUNTER — Encounter (INDEPENDENT_AMBULATORY_CARE_PROVIDER_SITE_OTHER): Payer: Self-pay | Admitting: General Surgery

## 2011-08-07 NOTE — Progress Notes (Unsigned)
Signed prescription for Synthroid 125 mcg tablet, quantity of 30 with 3 refills  (signed by Dr. Gerrit Friends) faxed to [facility]. Original sent to medical records to be scanned.

## 2011-08-11 ENCOUNTER — Encounter (INDEPENDENT_AMBULATORY_CARE_PROVIDER_SITE_OTHER): Payer: Self-pay

## 2011-09-21 ENCOUNTER — Encounter: Payer: 59 | Attending: Family Medicine | Admitting: Dietician

## 2011-09-21 ENCOUNTER — Encounter: Payer: Self-pay | Admitting: Dietician

## 2011-09-21 VITALS — Ht 69.0 in | Wt 189.3 lb

## 2011-09-21 DIAGNOSIS — Z713 Dietary counseling and surveillance: Secondary | ICD-10-CM | POA: Insufficient documentation

## 2011-09-21 DIAGNOSIS — E119 Type 2 diabetes mellitus without complications: Secondary | ICD-10-CM | POA: Insufficient documentation

## 2011-09-21 NOTE — Progress Notes (Signed)
  Patient was seen on 09/21/2011 for their 3 month follow-up as a part of the diabetes self-management courses at the [facility]. The following learning objectives were met by your patient during this course:  Patient self reports the following:  Diabetes control has improved since diabetes self-management training: Notes that it has improved. Number of days blood glucose is >200: None Last MD appointment for diabetes: June Changes in treatment plan: D/C the lisinopril HCTZ and "added another medication, but I'm unsure of the name. Confidence with ability to manage diabetes: Feels confident.  Has been know to counsel co-worker regarding the amount of regular soda he was drinking in one day and the total sugar content at the end of the day.  Areas for improvement with diabetes self-care: See dentist for the loose tooth and get it taken care of.  Keep DM stable with an A1C of less than 6.5%  Or less. Willingness to participate in diabetes support group: Working the evening shift.  Not currently able to attend.  Please see Diabetes Flow sheet for findings related to patient's self-care.  Follow-Up Plan: Patient is eligible for a "free" 30 minute diabetes self-care appointment in the next year. Patient to call and schedule as needed.

## 2011-10-30 ENCOUNTER — Emergency Department (HOSPITAL_COMMUNITY): Payer: 59

## 2011-10-30 ENCOUNTER — Emergency Department (HOSPITAL_COMMUNITY)
Admission: EM | Admit: 2011-10-30 | Discharge: 2011-10-31 | Disposition: A | Discharge status: Home / Self Care | Payer: 59 | Attending: Emergency Medicine | Admitting: Emergency Medicine

## 2011-10-30 DIAGNOSIS — E119 Type 2 diabetes mellitus without complications: Secondary | ICD-10-CM | POA: Insufficient documentation

## 2011-10-30 DIAGNOSIS — Z79899 Other long term (current) drug therapy: Secondary | ICD-10-CM | POA: Insufficient documentation

## 2011-10-30 DIAGNOSIS — I2510 Atherosclerotic heart disease of native coronary artery without angina pectoris: Secondary | ICD-10-CM | POA: Insufficient documentation

## 2011-10-30 DIAGNOSIS — R079 Chest pain, unspecified: Secondary | ICD-10-CM

## 2011-10-30 DIAGNOSIS — K219 Gastro-esophageal reflux disease without esophagitis: Secondary | ICD-10-CM | POA: Insufficient documentation

## 2011-10-30 DIAGNOSIS — I10 Essential (primary) hypertension: Secondary | ICD-10-CM | POA: Insufficient documentation

## 2011-10-30 LAB — BASIC METABOLIC PANEL
BUN: 13 mg/dL (ref 6–23)
Creatinine, Ser: 0.81 mg/dL (ref 0.50–1.35)
GFR calc Af Amer: >90 mL/min (ref >90)
GFR calc non Af Amer: >90 mL/min (ref >90)
Glucose, Bld: 120 mg/dL — ABNORMAL HIGH (ref 70–99)

## 2011-10-30 LAB — CBC
HCT: 42.7 % (ref 39.0–52.0)
MCHC: 34.7 g/dL (ref 30.0–36.0)
MCV: 88 fL (ref 78.0–100.0)
RDW: 13.1 % (ref 11.5–15.5)

## 2011-10-30 MED ORDER — SUCRALFATE 1 G PO TABS: 1.0000 g | ORAL_TABLET | Freq: Four times a day (QID) | ORAL | Status: DC

## 2011-10-30 MED ORDER — ASPIRIN 325 MG PO TABS: 325.0000 mg | ORAL_TABLET | ORAL | Status: DC

## 2011-10-30 MED ORDER — PANTOPRAZOLE SODIUM 20 MG PO TBEC: 20.0000 mg | DELAYED_RELEASE_TABLET | Freq: Every day | ORAL | Status: DC

## 2011-10-30 NOTE — ED Provider Notes (Signed)
History     CSN: [id number]  Arrival date & time 10/30/11  2148   First MD Initiated Contact with Patient 10/30/11 2320      Chief Complaint  Patient presents with  . Chest Pain    (Consider location/radiation/quality/duration/timing/severity/associated sxs/prior treatment) Patient is a 66 y.o. male presenting with chest pain. The history is provided by the patient.  Chest Pain    patient here with epigastric pain times several months which is been intermittent and lasting for minutes. No associated diaphoresis or exertional component. Pain has been nonradiating. Worse with eating and better with antacids. Denies any fever or cough. Some dyspnea. Was at work today and blood pressure was noted to be elevated and he was sent her for evaluation. No prior evaluation for same  Past Medical History  Diagnosis Date  . Hyperlipidemia   . Hypertension   . CAD (coronary artery disease)   . Depression   . ED (erectile dysfunction)   . Lung nodule   . Hepatic lesion     left lobe  . Tubular adenoma     polyps- Dr.Edwards  . Pneumonia   . Nasal congestion   . Change in voice   . Cough   . Thyroid disease     thyroid lesion  . Diabetes mellitus 04-25-11    oral meds  . Osteoarthritis 04-25-11    spine area-some neck issues    Past Surgical History  Procedure Date  . Knee surgery 04-25-11    right/ left knee scope  . Eye surgery 11/2010    cataract x2  . Eye surgery 12/2010    len implant x2   . Cardiac catheterization 04-25-11    many yrs ago-very slight blockage 1 vessel  . Thyroidectomy 05/02/2011    Procedure: THYROIDECTOMY;  Surgeon: Velora Heckler, MD;  Location: [facility];  Service: General;  Laterality: N/A;  Total Thyroidectomy    Family History  Problem Relation Age of Onset  . COPD Other   . Hypertension Other   . Hyperlipidemia Other   . Heart attack Other   . Cancer Brother     colon    History  Substance Use Topics  . Smoking status: Former Smoker -- 50 years      Quit date: 11/24/2009  . Smokeless tobacco: Never Used  . Alcohol Use: Yes     Beer on weekends, 6 or more      Review of Systems  Cardiovascular: Positive for chest pain.  All other systems reviewed and are negative.    Allergies  Codeine; Actos; Glyburide; and Metformin and related  Home Medications   Current Outpatient Rx  Name Route Sig Dispense Refill  . AMLODIPINE BESYLATE 5 MG PO TABS Oral Take 5 mg by mouth daily with breakfast.     . ASPIRIN 81 MG PO TABS Oral Take 81 mg by mouth daily with breakfast.     . FEXOFENADINE HCL 180 MG PO TABS Oral Take 180 mg by mouth daily as needed. Allergies     . FLUTICASONE FUROATE 27.5 MCG/SPRAY NA SUSP Nasal Place 2 sprays into the nose daily.    Marland Kitchen METFORMIN HCL ER 500 MG PO TB24 Oral Take 1,000 mg by mouth 2 (two) times daily.    . ADULT MULTIVITAMIN W/MINERALS CH Oral Take 1 tablet by mouth daily.    Marland Kitchen PRESCRIPTION MEDICATION Oral Take 1 tablet by mouth daily. Blood pressure.    Marland Kitchen PRESCRIPTION MEDICATION Oral Take 1 tablet by  mouth daily.     Marland Kitchen ROSUVASTATIN CALCIUM 20 MG PO TABS Oral Take 20 mg by mouth daily with breakfast.     . SYNTHROID 125 MCG PO TABS Oral Take 1 tablet (125 mcg total) by mouth daily. 30 tablet 3    Dispense as written.  Marland Kitchen SILDENAFIL CITRATE 25 MG PO TABS Oral Take 25 mg by mouth daily as needed.      BP 170/84  Temp 97.9 F (36.6 C) (Oral)  Resp 18  SpO2 96%  Physical Exam  Nursing note and vitals reviewed. Constitutional: He is oriented to person, place, and time. He appears well-developed and well-nourished.  Non-toxic appearance. No distress.  HENT:  Head: Normocephalic and atraumatic.  Eyes: Conjunctivae normal, EOM and lids are normal. Pupils are equal, round, and reactive to light.  Neck: Normal range of motion. Neck supple. No tracheal deviation present. No mass present.  Cardiovascular: Normal rate, regular rhythm and normal heart sounds.  Exam reveals no gallop.   No murmur  heard. Pulmonary/Chest: Effort normal and breath sounds normal. No stridor. No respiratory distress. He has no decreased breath sounds. He has no wheezes. He has no rhonchi. He has no rales.  Abdominal: Soft. Normal appearance and bowel sounds are normal. He exhibits no distension. There is no tenderness. There is no rebound and no CVA tenderness.  Musculoskeletal: Normal range of motion. He exhibits no edema and no tenderness.  Neurological: He is alert and oriented to person, place, and time. He has normal strength. No cranial nerve deficit or sensory deficit. GCS eye subscore is 4. GCS verbal subscore is 5. GCS motor subscore is 6.  Skin: Skin is warm and dry. No abrasion and no rash noted.  Psychiatric: He has a normal mood and affect. His speech is normal and behavior is normal.    ED Course  Procedures (including critical care time)  Labs Reviewed  BASIC METABOLIC PANEL - Abnormal; Notable for the following:    Potassium 3.3 (*)     Glucose, Bld 120 (*)     All other components within normal limits  CBC  TROPONIN I   Dg Chest Port 1 View  10/30/2011  *RADIOLOGY REPORT*  Clinical Data: Chest tightness, ex-smoker  PORTABLE CHEST - 1 VIEW  Comparison: 09/22/2008; chest CT - 02/14/2011  Findings: Grossly unchanged cardiac silhouette and mediastinal contours.  No focal parenchymal opacities.  No pleural effusion or pneumothorax.  Surgical clips overlying the lower neck.  Grossly unchanged bones.  IMPRESSION: No acute cardiopulmonary disease.   Original Report Authenticated By: Waynard Reeds, M.D.      No diagnosis found.    MDM   Date: 10/30/2011  Rate: 70  Rhythm: normal sinus rhythm  QRS Axis: normal  Intervals: normal  ST/T Wave abnormalities: normal  Conduction Disutrbances:none  Narrative Interpretation:   Old EKG Reviewed: unchanged  Patient with symptoms likely secondary to GERD. Does note increased belching. Denies any anginal type pain. No concern for ACS. EKG  without ischemic changes. Troponin negative. Chest x-ray within normal limits. Pain is epigastric. No syncope noted. Plans for patient be placed on a PPI of problems PCP        Toy Baker, MD 10/30/11 2332

## 2011-10-30 NOTE — ED Notes (Signed)
Pt c/o chest pain,sob and dizziness.pt was feeling worst over last week.pwd

## 2012-03-14 ENCOUNTER — Other Ambulatory Visit: Payer: Self-pay | Admitting: Family Medicine

## 2012-03-20 ENCOUNTER — Ambulatory Visit
Admission: RE | Admit: 2012-03-20 | Discharge: 2012-03-20 | Disposition: A | Discharge status: Home / Self Care | Payer: 59 | Source: Ambulatory Visit | Attending: Family Medicine | Admitting: Family Medicine

## 2012-03-20 DIAGNOSIS — N390 Urinary tract infection, site not specified: Secondary | ICD-10-CM

## 2012-04-01 ENCOUNTER — Other Ambulatory Visit (HOSPITAL_COMMUNITY): Payer: Self-pay | Admitting: Cardiology

## 2012-04-04 ENCOUNTER — Ambulatory Visit (HOSPITAL_COMMUNITY)
Admission: RE | Admit: 2012-04-04 | Discharge: 2012-04-04 | Disposition: A | Discharge status: Home / Self Care | Payer: 59 | Source: Ambulatory Visit | Attending: Cardiology | Admitting: Cardiology

## 2012-04-04 DIAGNOSIS — J4489 Other specified chronic obstructive pulmonary disease: Secondary | ICD-10-CM | POA: Insufficient documentation

## 2013-02-19 ENCOUNTER — Encounter (HOSPITAL_COMMUNITY): Payer: Self-pay | Admitting: Pharmacy Technician

## 2013-02-21 ENCOUNTER — Other Ambulatory Visit: Payer: Self-pay | Admitting: Orthopedic Surgery

## 2013-02-22 NOTE — Pre-Procedure Instructions (Signed)
Christopher Patel  02/22/2013   Your procedure is scheduled on:  February 9  Report to [facility] Entr[facility] [place] at [facility] AM.  Call this number if you have problems the morning of surgery: [phone]   Remember:   Do not eat food or drink liquids after midnight.   Take these medicines the morning of surgery with A SIP OF WATER: Amlodipine, Allegra (if needed), Nasal spray, Levothyroxine, Protonix   Stop Aspirin, Multiple Vitamins today   STOP/ Do not take Aspirin, Aleve, Naproxen, Advil, Ibuprofen, Vitamin, Herbs, or Supplements starting today   Do not wear jewelry, make-up or nail polish.  Do not wear lotions, powders, or perfumes. You may wear deodorant.  Do not shave 48 hours prior to surgery. Men may shave face and neck.  Do not bring valuables to the hospital.  [facility] is not responsible                  for any belongings or valuables.               Contacts, dentures or bridgework may not be worn into surgery.  Leave suitcase in the car. After surgery it may be brought to your room.  For patients admitted to the hospital, discharge time is determined by your                treatment team.    Please read over the following fact sheets that you were given: Pain Booklet, Coughing and Deep Breathing, Blood Transfusion Information, Total Joint Packet and Surgical Site Infection Prevention   [place] - Preparing for Surgery  Before surgery, you can play an important role.  Because skin is not sterile, your skin needs to be as free of germs as possible.  You can reduce the number of germs on you skin by washing with CHG (chlorahexidine gluconate) soap before surgery.  CHG is an antiseptic cleaner which kills germs and bonds with the skin to continue killing germs even after washing.  Please DO NOT use if you have an allergy to CHG or antibacterial soaps.  If your skin becomes reddened/irritated stop using the CHG and inform your nurse  when you arrive at Short Stay.  Do not shave (including legs and underarms) for at least 48 hours prior to the first CHG shower.  You may shave your face.  Please follow these instructions carefully:   1.  Shower with CHG Soap the night before surgery and the                                morning of Surgery.  2.  If you choose to wash your hair, wash your hair first as usual with your       normal shampoo.  3.  After you shampoo, rinse your hair and body thoroughly to remove the                      Shampoo.  4.  Use CHG as you would any other liquid soap.  You can apply chg directly       to the skin and wash gently with scrungie or a clean washcloth.  5.  Apply the CHG Soap to your body ONLY FROM THE NECK DOWN.        Do not use on open wounds or open sores.  Avoid  contact with your eyes,       ears, mouth and genitals (private parts).  Wash genitals (private parts)       with your normal soap.  6.  Wash thoroughly, paying special attention to the area where your surgery        will be performed.  7.  Thoroughly rinse your body with warm water from the neck down.  8.  DO NOT shower/wash with your normal soap after using and rinsing off       the CHG Soap.  9.  Pat yourself dry with a clean towel.            10.  Wear clean pajamas.            11.  Place clean sheets on your bed the night of your first shower and do not        sleep with pets.  Day of Surgery   Please wear clean clothes to the hospital/surgery center.

## 2013-02-24 ENCOUNTER — Ambulatory Visit (HOSPITAL_COMMUNITY)
Admission: RE | Admit: 2013-02-24 | Discharge: 2013-02-24 | Disposition: A | Discharge status: Home / Self Care | Payer: Medicare HMO | Source: Ambulatory Visit | Attending: Orthopedic Surgery | Admitting: Orthopedic Surgery

## 2013-02-24 ENCOUNTER — Encounter (HOSPITAL_COMMUNITY)
Admission: RE | Admit: 2013-02-24 | Discharge: 2013-02-24 | Disposition: A | Discharge status: Home / Self Care | Payer: Medicare HMO | Source: Ambulatory Visit | Attending: Orthopedic Surgery | Admitting: Orthopedic Surgery

## 2013-02-24 ENCOUNTER — Encounter (HOSPITAL_COMMUNITY): Payer: Self-pay

## 2013-02-24 DIAGNOSIS — Z01810 Encounter for preprocedural cardiovascular examination: Secondary | ICD-10-CM | POA: Insufficient documentation

## 2013-02-24 DIAGNOSIS — Z01818 Encounter for other preprocedural examination: Secondary | ICD-10-CM | POA: Insufficient documentation

## 2013-02-24 DIAGNOSIS — Z01812 Encounter for preprocedural laboratory examination: Secondary | ICD-10-CM | POA: Insufficient documentation

## 2013-02-24 HISTORY — DX: Other specified postprocedural states: R11.2

## 2013-02-24 HISTORY — DX: Hypothyroidism, unspecified: E03.9

## 2013-02-24 HISTORY — DX: Other specified postprocedural states: Z98.890

## 2013-02-24 LAB — TYPE AND SCREEN
ABO/RH(D): A NEG
ANTIBODY SCREEN: NEGATIVE

## 2013-02-24 LAB — URINE MICROSCOPIC-ADD ON

## 2013-02-24 LAB — URINALYSIS, ROUTINE W REFLEX MICROSCOPIC
Bilirubin Urine: NEGATIVE
Glucose, UA: >1000 mg/dL — AB
Ketones, ur: 40 mg/dL — AB
Leukocytes, UA: NEGATIVE
Nitrite: NEGATIVE
PH: 5 (ref 5.0–8.0)
Protein, ur: NEGATIVE mg/dL
SPECIFIC GRAVITY, URINE: 1.035 — AB (ref 1.005–1.030)
Urobilinogen, UA: 0.2 mg/dL (ref 0.0–1.0)

## 2013-02-24 LAB — SURGICAL PCR SCREEN
MRSA, PCR: NEGATIVE
STAPHYLOCOCCUS AUREUS: NEGATIVE

## 2013-02-24 LAB — BASIC METABOLIC PANEL
BUN: 11 mg/dL (ref 6–23)
CHLORIDE: 92 meq/L — AB (ref 96–112)
CO2: 25 mEq/L (ref 19–32)
Calcium: 9.2 mg/dL (ref 8.4–10.5)
Creatinine, Ser: 0.8 mg/dL (ref 0.50–1.35)
GFR calc Af Amer: >90 mL/min (ref >90)
GFR calc non Af Amer: >90 mL/min (ref >90)
Glucose, Bld: 231 mg/dL — ABNORMAL HIGH (ref 70–99)
Potassium: 4.2 mEq/L (ref 3.7–5.3)
Sodium: 136 mEq/L — ABNORMAL LOW (ref 137–147)

## 2013-02-24 LAB — PROTIME-INR
INR: 0.94 (ref 0.00–1.49)
Prothrombin Time: 12.4 seconds (ref 11.6–15.2)

## 2013-02-24 LAB — CBC WITH DIFFERENTIAL/PLATELET
BASOS ABS: 0 10*3/uL (ref 0.0–0.1)
Basophils Relative: 0 % (ref 0–1)
EOS ABS: 0 10*3/uL (ref 0.0–0.7)
EOS PCT: 0 % (ref 0–5)
HEMATOCRIT: 41.8 % (ref 39.0–52.0)
Hemoglobin: 15 g/dL (ref 13.0–17.0)
Lymphocytes Relative: 13 % (ref 12–46)
Lymphs Abs: 1.5 10*3/uL (ref 0.7–4.0)
MCH: 31.2 pg (ref 26.0–34.0)
MCHC: 35.9 g/dL (ref 30.0–36.0)
MCV: 86.9 fL (ref 78.0–100.0)
MONO ABS: 0.8 10*3/uL (ref 0.1–1.0)
Monocytes Relative: 7 % (ref 3–12)
Neutro Abs: 9.2 10*3/uL — ABNORMAL HIGH (ref 1.7–7.7)
Neutrophils Relative %: 79 % — ABNORMAL HIGH (ref 43–77)
Platelets: 251 10*3/uL (ref 150–400)
RBC: 4.81 MIL/uL (ref 4.22–5.81)
RDW: 12.7 % (ref 11.5–15.5)
WBC: 11.6 10*3/uL — ABNORMAL HIGH (ref 4.0–10.5)

## 2013-02-24 LAB — ABO/RH: ABO/RH(D): A NEG

## 2013-02-24 LAB — APTT: aPTT: 30 seconds (ref 24–37)

## 2013-02-24 MED ORDER — CHLORHEXIDINE GLUCONATE 4 % EX LIQD: 1.0000 "application " | Freq: Once | CUTANEOUS | Status: DC

## 2013-02-24 NOTE — Progress Notes (Signed)
Dr Dorthy Cooler called for stress test

## 2013-02-25 ENCOUNTER — Encounter (HOSPITAL_COMMUNITY): Payer: Self-pay | Admitting: Vascular Surgery

## 2013-02-25 ENCOUNTER — Encounter (HOSPITAL_COMMUNITY): Payer: Self-pay

## 2013-02-25 NOTE — Progress Notes (Signed)
Anesthesia Chart Review:  Patient is a 68 year old male scheduled for right TKA on 03/03/13 by Dr. Mayer Camel. History includes former smoker, HLD, CAD, ED, DM2, HTN, thyroidectomy for benign nodule with secondary hypothyroidism, left lobe hepatic lesion (c/w hemangioma by 10/24/08 MRI), lung nodule (stable since 2010 by 02/14/11 and therefore felt benign), tubular adenoma (Dr. Oletta Lamas).  PCP is Dr. Dorthy Cooler.  PCP notes indicate that prior cath by Dr. Egbert Garibaldi (currently located in [place]) showed non-critical CAD with 60% RCA stenosis.  It appears he was referred to cardiologist Dr. Radford Pax in 02/2012.  Her office note is pending, but he had an unremarkable stress and echo at that time.  EKG on 02/24/13 showed NSR.  Nuclear stress test on 03/01/12 [facility]) showed: normal myocardial perfusion, no regional wall motion abnormalities, post-stress EF 75%.  Echo on 03/01/12 [facility]) showed mild asymmetric septal hypertrophy, LVEF 72.9%, borderline LAE, Doppler findings suggestive of grade 1 diastolic dysfunction without elevated LA pressure.   CXR on 02/24/13 showed no active cardiopulmonary disease.  Preoperative labs noted.  He will get a fasting CBG on arrival.  He had functional testing within the past year and current EKG shows no ST/T wave abnormalities.  If no acute changes and glucose is reasonable then I would anticipate that he could proceed as planned.  George Hugh [facility] Short Stay Center/Anesthesiology Phone [phone] 02/25/2013 12:44 PM

## 2013-03-03 ENCOUNTER — Encounter (HOSPITAL_COMMUNITY): Admission: RE | Payer: Self-pay | Source: Ambulatory Visit

## 2013-03-03 ENCOUNTER — Inpatient Hospital Stay (HOSPITAL_COMMUNITY): Admission: RE | Admit: 2013-03-03 | Payer: Medicare HMO | Source: Ambulatory Visit | Admitting: Orthopedic Surgery

## 2013-03-03 SURGERY — ARTHROPLASTY, KNEE, TOTAL
Anesthesia: Choice | Laterality: Right

## 2013-03-31 ENCOUNTER — Other Ambulatory Visit: Payer: Self-pay | Admitting: Orthopedic Surgery

## 2013-04-01 ENCOUNTER — Other Ambulatory Visit: Payer: Self-pay | Admitting: Orthopedic Surgery

## 2013-04-11 ENCOUNTER — Encounter (HOSPITAL_COMMUNITY): Payer: Self-pay

## 2013-04-15 ENCOUNTER — Encounter (HOSPITAL_COMMUNITY)
Admission: RE | Admit: 2013-04-15 | Discharge: 2013-04-15 | Disposition: A | Discharge status: Home / Self Care | Payer: Medicare HMO | Source: Ambulatory Visit | Attending: Orthopedic Surgery | Admitting: Orthopedic Surgery

## 2013-04-15 ENCOUNTER — Encounter (HOSPITAL_COMMUNITY): Payer: Self-pay

## 2013-04-15 DIAGNOSIS — Z01812 Encounter for preprocedural laboratory examination: Secondary | ICD-10-CM | POA: Insufficient documentation

## 2013-04-15 LAB — BASIC METABOLIC PANEL
BUN: 15 mg/dL (ref 6–23)
CO2: 29 mEq/L (ref 19–32)
Calcium: 9.7 mg/dL (ref 8.4–10.5)
Chloride: 97 mEq/L (ref 96–112)
Creatinine, Ser: 0.82 mg/dL (ref 0.50–1.35)
GFR calc non Af Amer: >90 mL/min (ref >90)
GLUCOSE: 222 mg/dL — AB (ref 70–99)
POTASSIUM: 4.1 meq/L (ref 3.7–5.3)
Sodium: 138 mEq/L (ref 137–147)

## 2013-04-15 LAB — URINALYSIS, ROUTINE W REFLEX MICROSCOPIC
Bilirubin Urine: NEGATIVE
Glucose, UA: >1000 mg/dL — AB
Hgb urine dipstick: NEGATIVE
KETONES UR: NEGATIVE mg/dL
Leukocytes, UA: NEGATIVE
NITRITE: NEGATIVE
PROTEIN: NEGATIVE mg/dL
Specific Gravity, Urine: 1.024 (ref 1.005–1.030)
UROBILINOGEN UA: 0.2 mg/dL (ref 0.0–1.0)
pH: 5.5 (ref 5.0–8.0)

## 2013-04-15 LAB — CBC WITH DIFFERENTIAL/PLATELET
Basophils Absolute: 0 10*3/uL (ref 0.0–0.1)
Basophils Relative: 0 % (ref 0–1)
Eosinophils Absolute: 0.1 10*3/uL (ref 0.0–0.7)
Eosinophils Relative: 1 % (ref 0–5)
HCT: 44.6 % (ref 39.0–52.0)
HEMOGLOBIN: 15.5 g/dL (ref 13.0–17.0)
Lymphocytes Relative: 14 % (ref 12–46)
Lymphs Abs: 1.5 10*3/uL (ref 0.7–4.0)
MCH: 31.4 pg (ref 26.0–34.0)
MCHC: 34.8 g/dL (ref 30.0–36.0)
MCV: 90.5 fL (ref 78.0–100.0)
MONOS PCT: 8 % (ref 3–12)
Monocytes Absolute: 0.8 10*3/uL (ref 0.1–1.0)
NEUTROS ABS: 8 10*3/uL — AB (ref 1.7–7.7)
NEUTROS PCT: 77 % (ref 43–77)
Platelets: 242 10*3/uL (ref 150–400)
RBC: 4.93 MIL/uL (ref 4.22–5.81)
RDW: 13 % (ref 11.5–15.5)
WBC: 10.3 10*3/uL (ref 4.0–10.5)

## 2013-04-15 LAB — SURGICAL PCR SCREEN
MRSA, PCR: NEGATIVE
Staphylococcus aureus: NEGATIVE

## 2013-04-15 LAB — TYPE AND SCREEN
ABO/RH(D): A NEG
Antibody Screen: NEGATIVE

## 2013-04-15 LAB — APTT: aPTT: 30 seconds (ref 24–37)

## 2013-04-15 LAB — PROTIME-INR
INR: 0.95 (ref 0.00–1.49)
Prothrombin Time: 12.5 seconds (ref 11.6–15.2)

## 2013-04-15 LAB — URINE MICROSCOPIC-ADD ON

## 2013-04-15 NOTE — Pre-Procedure Instructions (Signed)
DAIMION ADAMCIK  04/15/2013   Your procedure is scheduled on:  04/25/13  Report to   2 * 3 at 845 AM.  Call this number if you have problems the morning of surgery: [phone]   Remember:   Do not eat food or drink liquids after midnight.   Take these medicines the morning of surgery with A SIP OF WATER: amlodipine,carvedilol,synthroid,protonix   Do not wear jewelry, make-up or nail polish.  Do not wear lotions, powders, or perfumes. You may wear deodorant.  Do not shave 48 hours prior to surgery. Men may shave face and neck.  Do not bring valuables to the hospital.  [facility] is not responsible                  for any belongings or valuables.               Contacts, dentures or bridgework may not be worn into surgery.  Leave suitcase in the car. After surgery it may be brought to your room.  For patients admitted to the hospital, discharge time is determined by your                treatment team.               Patients discharged the day of surgery will not be allowed to drive  home.  Name and phone number of your driver: family  Special Instructions: Shower using CHG 2 nights before surgery and the night before surgery.  If you shower the day of surgery use CHG.  Use special wash - you have one bottle of CHG for all showers.  You should use approximately 1/3 of the bottle for each shower.   Please read over the following fact sheets that you were given: Pain Booklet, Coughing and Deep Breathing, Blood Transfusion Information, MRSA Information and Surgical Site Infection Prevention

## 2013-04-24 MED ORDER — CHLORHEXIDINE GLUCONATE 4 % EX LIQD
60.0000 mL | Freq: Once | CUTANEOUS | Status: DC
  Filled 2013-04-24: qty 60

## 2013-04-24 MED ORDER — CEFAZOLIN SODIUM-DEXTROSE 2-3 GM-% IV SOLR
2.0000 g | INTRAVENOUS | Status: AC
  Administered 2013-04-25: 2 g via INTRAVENOUS
  Filled 2013-04-24: qty 50

## 2013-04-24 MED ORDER — DEXTROSE-NACL 5-0.45 % IV SOLN: INTRAVENOUS | Status: DC

## 2013-04-25 ENCOUNTER — Encounter (HOSPITAL_COMMUNITY): Payer: Medicare HMO | Admitting: Anesthesiology

## 2013-04-25 ENCOUNTER — Inpatient Hospital Stay (HOSPITAL_COMMUNITY)
Admission: RE | Admit: 2013-04-25 | Discharge: 2013-04-28 | DRG: 470 | Disposition: A | Discharge status: Skilled Nursing Facility | Payer: Medicare HMO | Source: Ambulatory Visit | Attending: Orthopedic Surgery | Admitting: Orthopedic Surgery

## 2013-04-25 ENCOUNTER — Inpatient Hospital Stay (HOSPITAL_COMMUNITY): Payer: Medicare HMO | Admitting: Anesthesiology

## 2013-04-25 ENCOUNTER — Encounter (HOSPITAL_COMMUNITY): Payer: Self-pay | Admitting: Anesthesiology

## 2013-04-25 ENCOUNTER — Encounter (HOSPITAL_COMMUNITY)
Admission: RE | Disposition: A | Discharge status: Skilled Nursing Facility | Payer: Self-pay | Source: Ambulatory Visit | Attending: Orthopedic Surgery

## 2013-04-25 DIAGNOSIS — R109 Unspecified abdominal pain: Secondary | ICD-10-CM | POA: Diagnosis present

## 2013-04-25 DIAGNOSIS — E042 Nontoxic multinodular goiter: Secondary | ICD-10-CM | POA: Diagnosis present

## 2013-04-25 DIAGNOSIS — F329 Major depressive disorder, single episode, unspecified: Secondary | ICD-10-CM | POA: Diagnosis present

## 2013-04-25 DIAGNOSIS — E890 Postprocedural hypothyroidism: Secondary | ICD-10-CM | POA: Diagnosis present

## 2013-04-25 DIAGNOSIS — E119 Type 2 diabetes mellitus without complications: Secondary | ICD-10-CM

## 2013-04-25 DIAGNOSIS — H269 Unspecified cataract: Secondary | ICD-10-CM | POA: Diagnosis present

## 2013-04-25 DIAGNOSIS — I2510 Atherosclerotic heart disease of native coronary artery without angina pectoris: Secondary | ICD-10-CM | POA: Diagnosis present

## 2013-04-25 DIAGNOSIS — Z87891 Personal history of nicotine dependence: Secondary | ICD-10-CM

## 2013-04-25 DIAGNOSIS — M1710 Unilateral primary osteoarthritis, unspecified knee: Principal | ICD-10-CM | POA: Diagnosis present

## 2013-04-25 DIAGNOSIS — I10 Essential (primary) hypertension: Secondary | ICD-10-CM | POA: Diagnosis present

## 2013-04-25 DIAGNOSIS — Z888 Allergy status to other drugs, medicaments and biological substances status: Secondary | ICD-10-CM

## 2013-04-25 DIAGNOSIS — F3289 Other specified depressive episodes: Secondary | ICD-10-CM | POA: Diagnosis present

## 2013-04-25 DIAGNOSIS — Z885 Allergy status to narcotic agent status: Secondary | ICD-10-CM

## 2013-04-25 DIAGNOSIS — E785 Hyperlipidemia, unspecified: Secondary | ICD-10-CM | POA: Diagnosis present

## 2013-04-25 DIAGNOSIS — M1711 Unilateral primary osteoarthritis, right knee: Secondary | ICD-10-CM | POA: Diagnosis present

## 2013-04-25 DIAGNOSIS — M479 Spondylosis, unspecified: Secondary | ICD-10-CM | POA: Diagnosis present

## 2013-04-25 HISTORY — PX: TOTAL KNEE ARTHROPLASTY: SHX125

## 2013-04-25 LAB — HEMOGLOBIN A1C
Hgb A1c MFr Bld: 7.2 % — ABNORMAL HIGH (ref <5.7)
MEAN PLASMA GLUCOSE: 160 mg/dL — AB (ref <117)

## 2013-04-25 LAB — GLUCOSE, CAPILLARY
GLUCOSE-CAPILLARY: 143 mg/dL — AB (ref 70–99)
GLUCOSE-CAPILLARY: 193 mg/dL — AB (ref 70–99)
Glucose-Capillary: 156 mg/dL — ABNORMAL HIGH (ref 70–99)
Glucose-Capillary: 188 mg/dL — ABNORMAL HIGH (ref 70–99)

## 2013-04-25 SURGERY — ARTHROPLASTY, KNEE, TOTAL
Anesthesia: General | Site: Knee | Laterality: Right

## 2013-04-25 MED ORDER — SCOPOLAMINE 1 MG/3DAYS TD PT72
MEDICATED_PATCH | TRANSDERMAL | Status: AC
  Administered 2013-04-25: 1.5 mg
  Filled 2013-04-25: qty 1

## 2013-04-25 MED ORDER — FENTANYL CITRATE 0.05 MG/ML IJ SOLN
INTRAMUSCULAR | Status: AC
  Administered 2013-04-25: 100 ug via INTRAVENOUS
  Filled 2013-04-25: qty 2

## 2013-04-25 MED ORDER — KCL IN DEXTROSE-NACL 20-5-0.45 MEQ/L-%-% IV SOLN
INTRAVENOUS | Status: DC
  Administered 2013-04-25 – 2013-04-26 (×2): via INTRAVENOUS
  Filled 2013-04-25 (×14): qty 1000

## 2013-04-25 MED ORDER — CEFUROXIME SODIUM 1.5 G IJ SOLR
INTRAMUSCULAR | Status: AC
  Filled 2013-04-25: qty 1.5

## 2013-04-25 MED ORDER — HYDROMORPHONE HCL PF 1 MG/ML IJ SOLN
INTRAMUSCULAR | Status: AC
  Administered 2013-04-25: 0.5 mg via INTRAVENOUS
  Filled 2013-04-25: qty 2

## 2013-04-25 MED ORDER — METOCLOPRAMIDE HCL 10 MG PO TABS: 5.0000 mg | ORAL_TABLET | Freq: Three times a day (TID) | ORAL | Status: DC | PRN

## 2013-04-25 MED ORDER — BUPIVACAINE-EPINEPHRINE (PF) 0.25% -1:200000 IJ SOLN
INTRAMUSCULAR | Status: AC
  Filled 2013-04-25: qty 30

## 2013-04-25 MED ORDER — DEXTROSE 5 % IV SOLN
INTRAVENOUS | Status: DC | PRN
  Administered 2013-04-25: 11:00:00 via INTRAVENOUS

## 2013-04-25 MED ORDER — PROPOFOL 10 MG/ML IV BOLUS
INTRAVENOUS | Status: DC | PRN
  Administered 2013-04-25: 200 mg via INTRAVENOUS

## 2013-04-25 MED ORDER — BUPIVACAINE LIPOSOME 1.3 % IJ SUSP
20.0000 mL | INTRAMUSCULAR | Status: AC
  Administered 2013-04-25: 20 mL
  Filled 2013-04-25: qty 20

## 2013-04-25 MED ORDER — OXYCODONE HCL 5 MG/5ML PO SOLN: 5.0000 mg | Freq: Once | ORAL | Status: DC | PRN

## 2013-04-25 MED ORDER — FLEET ENEMA 7-19 GM/118ML RE ENEM: 1.0000 | ENEMA | Freq: Once | RECTAL | Status: AC | PRN

## 2013-04-25 MED ORDER — ACETAMINOPHEN 325 MG PO TABS: 650.0000 mg | ORAL_TABLET | Freq: Four times a day (QID) | ORAL | Status: DC | PRN

## 2013-04-25 MED ORDER — METFORMIN HCL ER 500 MG PO TB24: 1000.0000 mg | ORAL_TABLET | Freq: Two times a day (BID) | ORAL | Status: DC

## 2013-04-25 MED ORDER — BISACODYL 5 MG PO TBEC: 5.0000 mg | DELAYED_RELEASE_TABLET | Freq: Every day | ORAL | Status: DC | PRN

## 2013-04-25 MED ORDER — 0.9 % SODIUM CHLORIDE (POUR BTL) OPTIME
TOPICAL | Status: DC | PRN
  Administered 2013-04-25: 1000 mL

## 2013-04-25 MED ORDER — METFORMIN HCL ER 500 MG PO TB24
1000.0000 mg | ORAL_TABLET | Freq: Two times a day (BID) | ORAL | Status: DC
  Administered 2013-04-25 – 2013-04-26 (×3): 1000 mg via ORAL
  Filled 2013-04-25 (×6): qty 2

## 2013-04-25 MED ORDER — LIDOCAINE HCL (CARDIAC) 20 MG/ML IV SOLN
INTRAVENOUS | Status: AC
  Filled 2013-04-25: qty 5

## 2013-04-25 MED ORDER — MENTHOL 3 MG MT LOZG: 1.0000 | LOZENGE | OROMUCOSAL | Status: DC | PRN

## 2013-04-25 MED ORDER — SENNOSIDES-DOCUSATE SODIUM 8.6-50 MG PO TABS: 1.0000 | ORAL_TABLET | Freq: Every evening | ORAL | Status: DC | PRN

## 2013-04-25 MED ORDER — MIDAZOLAM HCL 2 MG/2ML IJ SOLN
INTRAMUSCULAR | Status: AC
  Administered 2013-04-25: 2 mg via INTRAVENOUS
  Filled 2013-04-25: qty 2

## 2013-04-25 MED ORDER — SCOPOLAMINE 1 MG/3DAYS TD PT72
MEDICATED_PATCH | TRANSDERMAL | Status: DC | PRN
  Administered 2013-04-25: 1 via TRANSDERMAL

## 2013-04-25 MED ORDER — SCOPOLAMINE 1 MG/3DAYS TD PT72
MEDICATED_PATCH | TRANSDERMAL | Status: AC
  Filled 2013-04-25: qty 1

## 2013-04-25 MED ORDER — LACTATED RINGERS IV SOLN
INTRAVENOUS | Status: DC | PRN
  Administered 2013-04-25 (×2): via INTRAVENOUS

## 2013-04-25 MED ORDER — SODIUM CHLORIDE 0.9 % IJ SOLN
INTRAMUSCULAR | Status: AC
  Filled 2013-04-25: qty 30

## 2013-04-25 MED ORDER — OXYCODONE HCL 5 MG PO TABS
5.0000 mg | ORAL_TABLET | ORAL | Status: DC | PRN
  Administered 2013-04-25 (×4): 10 mg via ORAL
  Administered 2013-04-26: 5 mg via ORAL
  Administered 2013-04-26 – 2013-04-28 (×8): 10 mg via ORAL
  Filled 2013-04-25 (×11): qty 2

## 2013-04-25 MED ORDER — ONDANSETRON HCL 4 MG PO TABS: 4.0000 mg | ORAL_TABLET | Freq: Four times a day (QID) | ORAL | Status: DC | PRN

## 2013-04-25 MED ORDER — DOCUSATE SODIUM 100 MG PO CAPS
100.0000 mg | ORAL_CAPSULE | Freq: Two times a day (BID) | ORAL | Status: DC
  Administered 2013-04-25 – 2013-04-28 (×7): 100 mg via ORAL
  Filled 2013-04-25 (×9): qty 1

## 2013-04-25 MED ORDER — METOCLOPRAMIDE HCL 5 MG/ML IJ SOLN: 5.0000 mg | Freq: Three times a day (TID) | INTRAMUSCULAR | Status: DC | PRN

## 2013-04-25 MED ORDER — HYDROMORPHONE HCL PF 1 MG/ML IJ SOLN
1.0000 mg | INTRAMUSCULAR | Status: DC | PRN
  Administered 2013-04-25 – 2013-04-26 (×3): 1 mg via INTRAVENOUS
  Filled 2013-04-25 (×3): qty 1

## 2013-04-25 MED ORDER — SODIUM CHLORIDE 0.9 % IJ SOLN
INTRAMUSCULAR | Status: DC | PRN
  Administered 2013-04-25: 30 mL

## 2013-04-25 MED ORDER — DIPHENHYDRAMINE HCL 12.5 MG/5ML PO ELIX
12.5000 mg | ORAL_SOLUTION | ORAL | Status: DC | PRN
  Administered 2013-04-26 – 2013-04-28 (×3): 25 mg via ORAL
  Filled 2013-04-25 (×3): qty 10

## 2013-04-25 MED ORDER — METHOCARBAMOL 100 MG/ML IJ SOLN
500.0000 mg | Freq: Four times a day (QID) | INTRAVENOUS | Status: DC | PRN
  Filled 2013-04-25: qty 5

## 2013-04-25 MED ORDER — LEVOTHYROXINE SODIUM 150 MCG PO TABS
150.0000 ug | ORAL_TABLET | Freq: Every day | ORAL | Status: DC
  Administered 2013-04-26 – 2013-04-28 (×3): 150 ug via ORAL
  Filled 2013-04-25 (×4): qty 1

## 2013-04-25 MED ORDER — LIDOCAINE HCL (CARDIAC) 20 MG/ML IV SOLN
INTRAVENOUS | Status: DC | PRN
Start: 2013-04-25 — End: 2013-04-25
  Administered 2013-04-25: 80 mg via INTRAVENOUS

## 2013-04-25 MED ORDER — AMLODIPINE BESYLATE 10 MG PO TABS
10.0000 mg | ORAL_TABLET | Freq: Every day | ORAL | Status: DC
  Administered 2013-04-26 – 2013-04-28 (×3): 10 mg via ORAL
  Filled 2013-04-25 (×4): qty 1

## 2013-04-25 MED ORDER — ACETAMINOPHEN 650 MG RE SUPP: 650.0000 mg | Freq: Four times a day (QID) | RECTAL | Status: DC | PRN

## 2013-04-25 MED ORDER — INSULIN ASPART 100 UNIT/ML ~~LOC~~ SOLN
0.0000 [IU] | Freq: Three times a day (TID) | SUBCUTANEOUS | Status: DC
  Administered 2013-04-25 – 2013-04-26 (×4): 3 [IU] via SUBCUTANEOUS
  Administered 2013-04-27: 1 [IU] via SUBCUTANEOUS
  Administered 2013-04-27 (×2): 2 [IU] via SUBCUTANEOUS
  Administered 2013-04-28: 3 [IU] via SUBCUTANEOUS

## 2013-04-25 MED ORDER — SODIUM CHLORIDE 0.9 % IR SOLN
Status: DC | PRN
  Administered 2013-04-25: 1000 mL

## 2013-04-25 MED ORDER — PROPOFOL 10 MG/ML IV BOLUS
INTRAVENOUS | Status: AC
  Filled 2013-04-25: qty 20

## 2013-04-25 MED ORDER — FENTANYL CITRATE 0.05 MG/ML IJ SOLN
INTRAMUSCULAR | Status: AC
  Filled 2013-04-25: qty 5

## 2013-04-25 MED ORDER — FENTANYL CITRATE 0.05 MG/ML IJ SOLN
INTRAMUSCULAR | Status: DC | PRN
  Administered 2013-04-25: 50 ug via INTRAVENOUS
  Administered 2013-04-25: 25 ug via INTRAVENOUS
  Administered 2013-04-25: 50 ug via INTRAVENOUS
  Administered 2013-04-25: 25 ug via INTRAVENOUS

## 2013-04-25 MED ORDER — ASPIRIN EC 325 MG PO TBEC
325.0000 mg | DELAYED_RELEASE_TABLET | Freq: Every day | ORAL | Status: DC
  Administered 2013-04-26 – 2013-04-28 (×3): 325 mg via ORAL
  Filled 2013-04-25 (×4): qty 1

## 2013-04-25 MED ORDER — CEFUROXIME SODIUM 1.5 G IJ SOLR
INTRAMUSCULAR | Status: DC | PRN
Start: 2013-04-25 — End: 2013-04-25
  Administered 2013-04-25: 1.5 g

## 2013-04-25 MED ORDER — HYDROMORPHONE HCL PF 1 MG/ML IJ SOLN
0.2500 mg | INTRAMUSCULAR | Status: DC | PRN
  Administered 2013-04-25 (×3): 0.5 mg via INTRAVENOUS

## 2013-04-25 MED ORDER — SCOPOLAMINE 1 MG/3DAYS TD PT72: 1.0000 | MEDICATED_PATCH | TRANSDERMAL | Status: DC

## 2013-04-25 MED ORDER — ARTIFICIAL TEARS OP OINT
TOPICAL_OINTMENT | OPHTHALMIC | Status: AC
  Filled 2013-04-25: qty 3.5

## 2013-04-25 MED ORDER — ONDANSETRON HCL 4 MG/2ML IJ SOLN: 4.0000 mg | Freq: Once | INTRAMUSCULAR | Status: DC | PRN

## 2013-04-25 MED ORDER — OXYCODONE HCL 5 MG PO TABS
ORAL_TABLET | ORAL | Status: AC
  Administered 2013-04-25: 10 mg via ORAL
  Filled 2013-04-25: qty 2

## 2013-04-25 MED ORDER — MIDAZOLAM HCL 2 MG/2ML IJ SOLN
INTRAMUSCULAR | Status: AC
  Filled 2013-04-25: qty 2

## 2013-04-25 MED ORDER — PHENOL 1.4 % MT LIQD: 1.0000 | OROMUCOSAL | Status: DC | PRN

## 2013-04-25 MED ORDER — METHOCARBAMOL 500 MG PO TABS
ORAL_TABLET | ORAL | Status: AC
  Administered 2013-04-25: 500 mg via ORAL
  Filled 2013-04-25: qty 1

## 2013-04-25 MED ORDER — SCOPOLAMINE 1 MG/3DAYS TD PT72
1.0000 | MEDICATED_PATCH | Freq: Once | TRANSDERMAL | Status: DC
  Filled 2013-04-25: qty 1

## 2013-04-25 MED ORDER — ONDANSETRON HCL 4 MG/2ML IJ SOLN
INTRAMUSCULAR | Status: AC
  Filled 2013-04-25: qty 2

## 2013-04-25 MED ORDER — ALUM & MAG HYDROXIDE-SIMETH 200-200-20 MG/5ML PO SUSP: 30.0000 mL | ORAL | Status: DC | PRN

## 2013-04-25 MED ORDER — ONDANSETRON HCL 4 MG/2ML IJ SOLN
INTRAMUSCULAR | Status: DC | PRN
  Administered 2013-04-25: 4 mg via INTRAVENOUS

## 2013-04-25 MED ORDER — LOSARTAN POTASSIUM 50 MG PO TABS
100.0000 mg | ORAL_TABLET | Freq: Every day | ORAL | Status: DC
  Administered 2013-04-25 – 2013-04-28 (×4): 100 mg via ORAL
  Filled 2013-04-25 (×4): qty 2

## 2013-04-25 MED ORDER — ONDANSETRON HCL 4 MG/2ML IJ SOLN
4.0000 mg | Freq: Four times a day (QID) | INTRAMUSCULAR | Status: DC | PRN
  Administered 2013-04-25: 4 mg via INTRAVENOUS
  Filled 2013-04-25: qty 2

## 2013-04-25 MED ORDER — METHOCARBAMOL 500 MG PO TABS
500.0000 mg | ORAL_TABLET | Freq: Four times a day (QID) | ORAL | Status: DC | PRN
  Administered 2013-04-25 – 2013-04-28 (×6): 500 mg via ORAL
  Filled 2013-04-25 (×5): qty 1

## 2013-04-25 MED ORDER — BUPIVACAINE-EPINEPHRINE PF 0.5-1:200000 % IJ SOLN
INTRAMUSCULAR | Status: DC | PRN
  Administered 2013-04-25: 20 mL via PERINEURAL

## 2013-04-25 MED ORDER — FLUTICASONE FUROATE 27.5 MCG/SPRAY NA SUSP: 2.0000 | Freq: Every day | NASAL | Status: DC | PRN

## 2013-04-25 MED ORDER — OXYCODONE HCL 5 MG PO TABS: 5.0000 mg | ORAL_TABLET | Freq: Once | ORAL | Status: DC | PRN

## 2013-04-25 MED ORDER — CARVEDILOL 3.125 MG PO TABS
3.1250 mg | ORAL_TABLET | Freq: Two times a day (BID) | ORAL | Status: DC
  Administered 2013-04-25 – 2013-04-28 (×6): 3.125 mg via ORAL
  Filled 2013-04-25 (×8): qty 1

## 2013-04-25 MED ORDER — PANTOPRAZOLE SODIUM 40 MG PO TBEC
40.0000 mg | DELAYED_RELEASE_TABLET | Freq: Every day | ORAL | Status: DC
  Administered 2013-04-25 – 2013-04-28 (×4): 40 mg via ORAL
  Filled 2013-04-25 (×3): qty 1

## 2013-04-25 MED ORDER — METHOCARBAMOL 500 MG PO TABS: 500.0000 mg | ORAL_TABLET | Freq: Two times a day (BID) | ORAL | Status: DC

## 2013-04-25 MED ORDER — GLIMEPIRIDE 4 MG PO TABS
8.0000 mg | ORAL_TABLET | Freq: Every day | ORAL | Status: DC
  Administered 2013-04-26 – 2013-04-28 (×3): 8 mg via ORAL
  Filled 2013-04-25 (×4): qty 2

## 2013-04-25 MED ORDER — ATORVASTATIN CALCIUM 40 MG PO TABS
40.0000 mg | ORAL_TABLET | Freq: Every day | ORAL | Status: DC
  Administered 2013-04-25 – 2013-04-27 (×3): 40 mg via ORAL
  Filled 2013-04-25 (×6): qty 1

## 2013-04-25 MED ORDER — LORATADINE 10 MG PO TABS
10.0000 mg | ORAL_TABLET | Freq: Every day | ORAL | Status: DC
  Administered 2013-04-25 – 2013-04-28 (×4): 10 mg via ORAL
  Filled 2013-04-25 (×4): qty 1

## 2013-04-25 MED ORDER — LACTATED RINGERS IV SOLN
INTRAVENOUS | Status: DC
  Administered 2013-04-25: 10:00:00 via INTRAVENOUS

## 2013-04-25 MED ORDER — OXYCODONE-ACETAMINOPHEN 5-325 MG PO TABS: 1.0000 | ORAL_TABLET | ORAL | Status: DC | PRN

## 2013-04-25 MED ORDER — MIDAZOLAM HCL 5 MG/5ML IJ SOLN
INTRAMUSCULAR | Status: DC | PRN
Start: 2013-04-25 — End: 2013-04-25
  Administered 2013-04-25 (×2): 1 mg via INTRAVENOUS

## 2013-04-25 MED ORDER — ASPIRIN EC 325 MG PO TBEC: 325.0000 mg | DELAYED_RELEASE_TABLET | Freq: Two times a day (BID) | ORAL | Status: DC

## 2013-04-25 MED ORDER — FLUTICASONE PROPIONATE 50 MCG/ACT NA SUSP
2.0000 | Freq: Every day | NASAL | Status: DC | PRN
  Filled 2013-04-25: qty 16

## 2013-04-25 MED ORDER — HYDROCHLOROTHIAZIDE 25 MG PO TABS
25.0000 mg | ORAL_TABLET | Freq: Every day | ORAL | Status: DC
  Administered 2013-04-25 – 2013-04-28 (×4): 25 mg via ORAL
  Filled 2013-04-25 (×4): qty 1

## 2013-04-25 SURGICAL SUPPLY — 63 items
BANDAGE ESMARK 6X9 LF (GAUZE/BANDAGES/DRESSINGS) ×1 IMPLANT
BLADE SAG 18X100X1.27 (BLADE) ×3 IMPLANT
BLADE SAW SGTL 13X75X1.27 (BLADE) ×3 IMPLANT
BLADE SURG ROTATE 9660 (MISCELLANEOUS) IMPLANT
BNDG ELASTIC 6X10 VLCR STRL LF (GAUZE/BANDAGES/DRESSINGS) ×3 IMPLANT
BNDG ESMARK 6X9 LF (GAUZE/BANDAGES/DRESSINGS) ×3
BOWL SMART MIX CTS (DISPOSABLE) ×3 IMPLANT
CAPT RP KNEE ×3 IMPLANT
CEMENT HV SMART SET (Cement) ×6 IMPLANT
COVER SURGICAL LIGHT HANDLE (MISCELLANEOUS) ×3 IMPLANT
CUFF TOURNIQUET SINGLE 34IN LL (TOURNIQUET CUFF) IMPLANT
CUFF TOURNIQUET SINGLE 44IN (TOURNIQUET CUFF) IMPLANT
DRAPE EXTREMITY T 121X128X90 (DRAPE) ×3 IMPLANT
DRAPE U-SHAPE 47X51 STRL (DRAPES) ×3 IMPLANT
DURAPREP 26ML APPLICATOR (WOUND CARE) ×6 IMPLANT
ELECT REM PT RETURN 9FT ADLT (ELECTROSURGICAL) ×3
ELECTRODE REM PT RTRN 9FT ADLT (ELECTROSURGICAL) ×1 IMPLANT
EVACUATOR 1/8 PVC DRAIN (DRAIN) ×3 IMPLANT
GAUZE XEROFORM 1X8 LF (GAUZE/BANDAGES/DRESSINGS) ×3 IMPLANT
GLOVE BIO SURGEON STRL SZ7 (GLOVE) ×3 IMPLANT
GLOVE BIO SURGEON STRL SZ7.5 (GLOVE) ×6 IMPLANT
GLOVE BIO SURGEON STRL SZ8 (GLOVE) ×3 IMPLANT
GLOVE BIO SURGEON STRL SZ8.5 (GLOVE) ×3 IMPLANT
GLOVE BIOGEL PI IND STRL 6.5 (GLOVE) ×1 IMPLANT
GLOVE BIOGEL PI IND STRL 7.0 (GLOVE) ×1 IMPLANT
GLOVE BIOGEL PI IND STRL 8 (GLOVE) ×1 IMPLANT
GLOVE BIOGEL PI IND STRL 9 (GLOVE) ×1 IMPLANT
GLOVE BIOGEL PI INDICATOR 6.5 (GLOVE) ×2
GLOVE BIOGEL PI INDICATOR 7.0 (GLOVE) ×2
GLOVE BIOGEL PI INDICATOR 8 (GLOVE) ×2
GLOVE BIOGEL PI INDICATOR 9 (GLOVE) ×2
GOWN STRL REUS W/ TWL LRG LVL3 (GOWN DISPOSABLE) ×1 IMPLANT
GOWN STRL REUS W/ TWL XL LVL3 (GOWN DISPOSABLE) ×2 IMPLANT
GOWN STRL REUS W/TWL LRG LVL3 (GOWN DISPOSABLE) ×2
GOWN STRL REUS W/TWL XL LVL3 (GOWN DISPOSABLE) ×4
HANDPIECE INTERPULSE COAX TIP (DISPOSABLE) ×2
HOOD PEEL AWAY FACE SHEILD DIS (HOOD) ×6 IMPLANT
KIT BASIN OR (CUSTOM PROCEDURE TRAY) ×3 IMPLANT
KIT ROOM TURNOVER OR (KITS) ×3 IMPLANT
MANIFOLD NEPTUNE II (INSTRUMENTS) ×3 IMPLANT
NDL SAFETY ECLIPSE 18X1.5 (NEEDLE) IMPLANT
NEEDLE 22X1 1/2 (OR ONLY) (NEEDLE) ×3 IMPLANT
NEEDLE HYPO 18GX1.5 SHARP (NEEDLE)
NEEDLE SPNL 18GX3.5 QUINCKE PK (NEEDLE) IMPLANT
NS IRRIG 1000ML POUR BTL (IV SOLUTION) ×3 IMPLANT
PACK TOTAL JOINT (CUSTOM PROCEDURE TRAY) ×3 IMPLANT
PAD ARMBOARD 7.5X6 YLW CONV (MISCELLANEOUS) ×6 IMPLANT
PADDING CAST COTTON 6X4 STRL (CAST SUPPLIES) ×3 IMPLANT
SET HNDPC FAN SPRY TIP SCT (DISPOSABLE) ×1 IMPLANT
SPONGE GAUZE 4X4 12PLY (GAUZE/BANDAGES/DRESSINGS) ×3 IMPLANT
STAPLER VISISTAT 35W (STAPLE) ×3 IMPLANT
SUCTION FRAZIER TIP 10 FR DISP (SUCTIONS) ×3 IMPLANT
SUT VIC AB 0 CTX 36 (SUTURE) ×2
SUT VIC AB 0 CTX36XBRD ANTBCTR (SUTURE) ×1 IMPLANT
SUT VIC AB 1 CTX 36 (SUTURE) ×2
SUT VIC AB 1 CTX36XBRD ANBCTR (SUTURE) ×1 IMPLANT
SUT VIC AB 2-0 CT1 27 (SUTURE) ×2
SUT VIC AB 2-0 CT1 TAPERPNT 27 (SUTURE) ×1 IMPLANT
SYR 30ML LL (SYRINGE) ×3 IMPLANT
SYR 50ML LL SCALE MARK (SYRINGE) ×3 IMPLANT
TOWEL OR 17X24 6PK STRL BLUE (TOWEL DISPOSABLE) ×3 IMPLANT
TOWEL OR 17X26 10 PK STRL BLUE (TOWEL DISPOSABLE) ×3 IMPLANT
WATER STERILE IRR 1000ML POUR (IV SOLUTION) ×6 IMPLANT

## 2013-04-25 NOTE — Plan of Care (Signed)
Problem: Consults Goal: Diagnosis- Total Joint Replacement Primary Total Knee Right     

## 2013-04-25 NOTE — Transfer of Care (Signed)
Immediate Anesthesia Transfer of Care Note  Patient: Christopher Patel  Procedure(s) Performed: Procedure(s): TOTAL KNEE ARTHROPLASTY (Right)  Patient Location: PACU  Anesthesia Type:General  Level of Consciousness: awake and sedated  Airway & Oxygen Therapy: Patient Spontanous Breathing and Patient connected to nasal cannula oxygen  Post-op Assessment: Report given to PACU RN, Post -op Vital signs reviewed and stable and Patient moving all extremities  Post vital signs: Reviewed and stable  Complications: No apparent anesthesia complications

## 2013-04-25 NOTE — Progress Notes (Signed)
Utilization Review Completed.Andranik Jeune T4/03/2013  

## 2013-04-25 NOTE — Op Note (Signed)
PATIENT ID:      Christopher Patel  MRN:     [id number] DOB/AGE:    66-Oct-1947 / 66 y.o.       OPERATIVE REPORT    DATE OF PROCEDURE:  04/25/2013       PREOPERATIVE DIAGNOSIS:   RIGHT KNEE OSTEOARTHRITIS       Estimated body mass index is 30.3 kg/(m^2) as calculated from the following:   Height as of 04/15/13: 5\' 9"  (1.753 m).   Weight as of this encounter: 93.101 kg (205 lb 4 oz).                                                        POSTOPERATIVE DIAGNOSIS:   RIGHT KNEE OSTEOARTHRITIS                                                                       PROCEDURE:  Procedure(s): TOTAL KNEE ARTHROPLASTY Using Depuy Sigma RP implants #3R Femur, #4Tibia, 64mm sigma RP bearing, 35 Patella     SURGEON: Kacy Hegna J    ASSISTANT:   [employer] K[employer]   (Present and scrubbed throughout the case, critical for assistance with exposure, retraction, instrumentation, and closure.)         ANESTHESIA: GET Exparel  DRAINS: foley, 2 medium hemovac in knee   TOURNIQUET TIME: [id number]   COMPLICATIONS:  None     SPECIMENS: None   INDICATIONS FOR PROCEDURE: The patient has  RIGHT KNEE OSTEOARTHRITIS , varus deformities, XR shows bone on bone arthritis. Patient has failed all conservative measures including anti-inflammatory medicines, narcotics, attempts at  exercise and weight loss, cortisone injections and viscosupplementation.  Risks and benefits of surgery have been discussed, questions answered.   DESCRIPTION OF PROCEDURE: The patient identified by armband, received  IV antibiotics, in the holding area at [facility]. Patient taken to the operating room, appropriate anesthetic  monitors were attached, and general endotracheal anesthesia induced with  the patient in supine position, Foley catheter was inserted. Tourniquet  applied high to the operative thigh. Lateral post and foot positioner  applied to the table, the lower extremity was then prepped and draped  in usual sterile fashion from  the ankle to the tourniquet. Time-out procedure was performed. The limb was wrapped with an Esmarch bandage and the tourniquet inflated to 350 mmHg. We began the operation by making the anterior midline incision starting at handbreadth above the patella going over the patella 1 cm medial to and  4 cm distal to the tibial tubercle. Small bleeders in the skin and the  subcutaneous tissue identified and cauterized. Transverse retinaculum was incised and reflected medially and a medial parapatellar arthrotomy was accomplished. the patella was everted and theprepatellar fat pad resected. The superficial medial collateral  ligament was then elevated from anterior to posterior along the proximal  flare of the tibia and anterior half of the menisci resected. The knee was hyperflexed exposing bone on bone arthritis. Peripheral and notch osteophytes as well as the cruciate ligaments were then resected. We continued to  work  our way around posteriorly along the proximal tibia, and externally  rotated the tibia subluxing it out from underneath the femur. A McHale  retractor was placed through the notch and a lateral Hohmann retractor  placed, and we then drilled through the proximal tibia in line with the  axis of the tibia followed by an intramedullary guide rod and 2-degree  posterior slope cutting guide. The tibial cutting guide was pinned into place  allowing resection of 7 mm of bone medially and about 3 mm of bone  laterally because of her varus deformity. Satisfied with the tibial resection, we then  entered the distal femur 2 mm anterior to the PCL origin with the  intramedullary guide rod and applied the distal femoral cutting guide  set at 10mm, with 5 degrees of valgus. This was pinned along the  epicondylar axis. At this point, the distal femoral cut was accomplished without difficulty. We then sized for a #3R femoral component and pinned the guide in 0 degrees of external rotation.The chamfer  cutting guide was pinned into place. The anterior, posterior, and chamfer cuts were accomplished without difficulty followed by  the Sigma RP box cutting guide and the box cut. We also removed posterior osteophytes from the posterior femoral condyles. At this  time, the knee was brought into full extension. We checked our  extension and flexion gaps and found them symmetric at 37mm.  The patella thickness measured at 24 mm. We set the cutting guide at 15 and removed the posterior 9 mm  of the patella, sized for a 35 button and drilled the lollipop. The knee  was then once again hyperflexed exposing the proximal tibia. We sized for a #4 tibial base plate, applied the smokestack and the conical reamer followed by the the Delta fin keel punch. We then hammered into place the Sigma RP trial femoral component, inserted a 10-mm trial bearing, trial patellar button, and took the knee through range of motion from 0-130 degrees. No thumb pressure was required for patellar  tracking. At this point, all trial components were removed, a double batch of DePuy HV cement with 1500 mg of Zinacef was mixed and applied to all bony metallic mating surfaces except for the posterior condyles of the femur itself. In order, we  hammered into place the tibial tray and removed excess cement, the femoral component and removed excess cement, a 10-mm Sigma RP bearing  was inserted, and the knee brought to full extension with compression.  The patellar button was clamped into place, and excess cement  removed. While the cement cured the wound was irrigated out with normal saline solution pulse lavage, and medium Hemovac drains were placed from an anterolateral  approach. Ligament stability and patellar tracking were checked and found to be excellent. The parapatellar arthrotomy was closed with  running #1 Vicryl suture. The subcutaneous tissue with 0 and 2-0 undyed  Vicryl suture, and the skin with skin staples. A dressing of  Xeroform,  4 x 4, dressing sponges, Webril, and Ace wrap applied. The patient  awakened, extubated, and taken to recovery room without difficulty.   Kasper Mudrick J 04/25/2013, 11:41 AM

## 2013-04-25 NOTE — H&P (Signed)
TOTAL KNEE ADMISSION H&P  Patient is being admitted for right total knee arthroplasty.  Subjective:  Chief Complaint:right knee pain.  HPI: Christopher Patel, 68 y.o. male, has a history of pain and functional disability in the right knee due to arthritis and has failed non-surgical conservative treatments for greater than 12 weeks to includeNSAID's and/or analgesics, corticosteriod injections, flexibility and strengthening excercises, use of assistive devices and activity modification.  Onset of symptoms was gradual, starting 8 years ago with gradually worsening course since that time. The patient noted prior procedures on the knee to include  arthroscopy on the right knee(s).  Patient currently rates pain in the right knee(s) at 8 out of 10 with activity. Patient has night pain, worsening of pain with activity and weight bearing, pain that interferes with activities of daily living and crepitus.  Patient has evidence of subchondral cysts, periarticular osteophytes, joint subluxation and joint space narrowing by imaging studies.. There is no active infection.  Patient Active Problem List   Diagnosis Date Noted  . Hypothyroidism, postsurgical 05/17/2011  . 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 Hyperlipidemia   . CAD (coronary artery disease)   . Depression   . ED (erectile dysfunction)   . Lung nodule   . Hepatic lesion     left lobe  . Tubular adenoma     polyps- Dr.Edwards  . Pneumonia   . Nasal congestion   . Change in voice   . Cough   . Thyroid disease     thyroid lesion  . Diabetes mellitus 04-25-11    oral meds  . Osteoarthritis 04-25-11    spine area-some neck issues  . Hypothyroidism   . PONV (postoperative nausea and vomiting)   . Hypertension     dr Marilynn Rail    Past Surgical History  Procedure Laterality Date  . Knee surgery  04-25-11    right/ left knee scope  . Eye surgery  11/2010    cataract x2  . Eye surgery   12/2010    len implant x2   . Thyroidectomy  05/02/2011    Procedure: THYROIDECTOMY;  Surgeon: Earnstine Regal, MD;  Location: [facility];  Service: General;  Laterality: N/A;  Total Thyroidectomy  . Cardiac catheterization  04-25-11    many yrs ago-very slight blockage 1 vessel (60% RCA by Dr. Dorthy Cooler notes)    No prescriptions prior to admission   Allergies  Allergen Reactions  . Codeine Itching  . Actos [Pioglitazone Hydrochloride] Nausea Only  . Glyburide     hypoglycemia  . Metformin And Related     Hypoglycemia, shakes    History  Substance Use Topics  . Smoking status: Former Smoker -- 60 years    Quit date: 11/24/2009  . Smokeless tobacco: Never Used  . Alcohol Use: Yes     Comment: Beer on weekends, 6 or more    Family History  Problem Relation Age of Onset  . COPD Other   . Hypertension Other   . Hyperlipidemia Other   . Heart attack Other   . Cancer Brother     colon     ROS : 14 point review of systems form filled out by the patient was reviewed and was negative as it relates to the history of present illness except for:  The patient is a diabetic on oral medications and diet control history of cataracts, high blood pressure, headaches, sleep disorder, and easy  bleeding  Objective:  Physical Exam Well-nourished well-developed patient seated on the exam table in no apparent distress, no shortness of breath.  The right knee has a 5-10 valgus configuration the left knee a 5 varus configuration the right knee is tender along the lateral joint line the left along the medial joint line range of motion on the right is 0-120 on the left, 5/120 , no palpable effusion in either knee.  He does walk with a moderately antalgic gait.  Normal pulses to the foot, normal sensation of the foot.  His skin is intact, good power to testing of the quadriceps and hamstrings.  Radiographs:  X-rays were ordered, performed, and interpreted by me today included; bilateral AP, Rosenberg, lateral  and sunrise x-rays revealed bone-on-bone valgus deformity on the right varus deformity on the left with early lateral subluxation of the tibia beneath the femur on the right and left.  There are significant peripheral osteophytes.   Estimated body mass index is 30.36 kg/(m^2) as calculated from the following:   Height as of 02/24/13: 5\' 9"  (1.753 m).   Weight as of 02/24/13: 93.305 kg (205 lb 11.2 oz).   Assessment/Plan:  End stage arthritis, right knee, Valgus  The patient history, physical examination, clinical judgment of the provider and imaging studies are consistent with end stage degenerative joint disease of the right knee(s) and total knee arthroplasty is deemed medically necessary. The treatment options including medical management, injection therapy arthroscopy and arthroplasty were discussed at length. The risks and benefits of total knee arthroplasty were presented and reviewed. The risks due to aseptic loosening, infection, stiffness, patella tracking problems, thromboembolic complications and other imponderables were discussed. The patient acknowledged the explanation, agreed to proceed with the plan and consent was signed. Patient is being admitted for inpatient treatment for surgery, pain control, PT, OT, prophylactic antibiotics, VTE prophylaxis, progressive ambulation and ADL's and discharge planning. The patient is planning to be discharged home with home health services

## 2013-04-25 NOTE — Anesthesia Postprocedure Evaluation (Signed)
  Anesthesia Post-op Note  Patient: Christopher Patel  Procedure(s) Performed: Procedure(s): TOTAL KNEE ARTHROPLASTY (Right)  Patient Location: PACU  Anesthesia Type:GA combined with regional for post-op pain  Level of Consciousness: awake, alert  and oriented  Airway and Oxygen Therapy: Patient Spontanous Breathing and Patient connected to nasal cannula oxygen  Post-op Pain: moderate  Post-op Assessment: Post-op Vital signs reviewed  Post-op Vital Signs: Reviewed  Complications: No apparent anesthesia complications

## 2013-04-25 NOTE — Evaluation (Signed)
Physical Therapy Evaluation Patient Details Name: Christopher Patel MRN: [id number] DOB: [birth year] Today's Date: 04/25/2013   History of Present Illness  Patient is a 67 yo male s/p Rt TKA.  Clinical Impression  Patient presents with problems listed below.  Will benefit from acute PT to maximize independence prior to discharge.  Patient lives alone and has upstairs bedroom.  Recommend ST-SNF for continued therapy at discharge.    Follow Up Recommendations SNF    Equipment Recommendations  Rolling walker with 5" wheels;3in1 (PT)    Recommendations for Other Services       Precautions / Restrictions Precautions Precautions: Knee Precaution Booklet Issued: Yes (comment) Precaution Comments: Reviewed precautions with patient and son. Restrictions Weight Bearing Restrictions: Yes RLE Weight Bearing: Weight bearing as tolerated      Mobility  Bed Mobility Overal bed mobility: Needs Assistance Bed Mobility: Supine to Sit     Supine to sit: Min assist     General bed mobility comments: Verbal cues for technique.  Assist to move RLE off of bed.  Good sitting balance once upright.  Transfers Overall transfer level: Needs assistance Equipment used: Rolling walker (2 wheeled) Transfers: Sit to/from [facility] Sit to Stand: Mod assist Stand pivot transfers: Mod assist       General transfer comment: Verbal cues for safe use of RW and technique.  Assist to rise to standing.  Patient able to take 2 steps with RW.  Pivoted to chair with assist and verbal cues.  Ambulation/Gait                Stairs            Wheelchair Mobility    Modified Rankin (Stroke Patients Only)       Balance                                             Pertinent Vitals/Pain Pain 8/10 with mobility.    Home Living Family/patient expects to be discharged to:: Skilled nursing facility ([facility]) Living Arrangements: Alone              Home Equipment: None      Prior Function Level of Independence: Independent               Hand Dominance        Extremity/Trunk Assessment   Upper Extremity Assessment: Overall WFL for tasks assessed           Lower Extremity Assessment: RLE deficits/detail RLE Deficits / Details: Decreased strength and ROM of knee due to pain/surgery    Cervical / Trunk Assessment: Normal  Communication   Communication: No difficulties  Cognition Arousal/Alertness: Lethargic;Suspect due to medications Behavior During Therapy: [facility] for tasks assessed/performed Overall Cognitive Status: Within Functional Limits for tasks assessed                      General Comments      Exercises Total Joint Exercises Ankle Circles/Pumps: AROM;Both;10 reps;Seated      Assessment/Plan    PT Assessment Patient needs continued PT services  PT Diagnosis Difficulty walking;Acute pain   PT Problem List Decreased strength;Decreased range of motion;Decreased activity tolerance;Decreased balance;Decreased mobility;Decreased knowledge of use of DME;Decreased knowledge of precautions;Pain  PT Treatment Interventions DME instruction;Gait training;Functional mobility training;Therapeutic exercise;Patient/family education   PT Goals (Current goals can be  found in the Care Plan section) Acute Rehab PT Goals Patient Stated Goal: To walk PT Goal Formulation: With patient/family Time For Goal Achievement: 05/02/13 Potential to Achieve Goals: Good    Frequency 7X/week   Barriers to discharge Decreased caregiver support;Inaccessible home environment Patient lives alone.  Bedroom is upstairs.    Co-evaluation               End of Session Equipment Utilized During Treatment: Gait belt;Oxygen Activity Tolerance: Patient limited by pain;Patient limited by fatigue Patient left: in chair;with call bell/phone within reach;with family/visitor present Nurse Communication: Mobility status  (Lethargic; Requesting food)         Time: 7342-8768 PT Time Calculation (min): 22 min   Charges:   PT Evaluation $Initial PT Evaluation Tier I: 1 Procedure PT Treatments $Therapeutic Activity: 8-22 mins   PT G Codes:          Despina Pole 04/25/2013, 4:26 PM Carita Pian. Sanjuana Kava, [place] Pager [phone]

## 2013-04-25 NOTE — Anesthesia Preprocedure Evaluation (Addendum)
Anesthesia Evaluation  Patient identified by MRN, date of birth, ID band Patient awake    Reviewed: Allergy & Precautions, H&P , NPO status , Patient's Chart, lab work & pertinent test results  History of Anesthesia Complications (+) PONV  Airway Mallampati: I TM Distance: >3 FB Neck ROM: Full    Dental  (+) Teeth Intact, Dental Advisory Given   Pulmonary former smoker,  breath sounds clear to auscultation        Cardiovascular hypertension, Pt. on medications Rhythm:Regular Rate:Normal     Neuro/Psych    GI/Hepatic   Endo/Other  diabetes, Well Controlled, Type 2, Oral Hypoglycemic Agents  Renal/GU      Musculoskeletal   Abdominal   Peds  Hematology   Anesthesia Other Findings   Reproductive/Obstetrics                          Anesthesia Physical Anesthesia Plan  ASA: III  Anesthesia Plan: General   Post-op Pain Management: MAC Combined w/ Regional for Post-op pain   Induction: Intravenous  Airway Management Planned: LMA  Additional Equipment:   Intra-op Plan:   Post-operative Plan: Extubation in OR  Informed Consent: I have reviewed the patients History and Physical, chart, labs and discussed the procedure including the risks, benefits and alternatives for the proposed anesthesia with the patient or authorized representative who has indicated his/her understanding and acceptance.   Dental advisory given  Plan Discussed with: CRNA, Anesthesiologist and Surgeon  Anesthesia Plan Comments:         Anesthesia Quick Evaluation

## 2013-04-25 NOTE — Interval H&P Note (Signed)
History and Physical Interval Note:  04/25/2013 8:58 AM  Christopher Patel  has presented today for surgery, with the diagnosis of RIGHT KNEE OSTEOARTHRITIS   The various methods of treatment have been discussed with the patient and family. After consideration of risks, benefits and other options for treatment, the patient has consented to  Procedure(s): TOTAL KNEE ARTHROPLASTY (Right) as a surgical intervention .  The patient's history has been reviewed, patient examined, no change in status, stable for surgery.  I have reviewed the patient's chart and labs.  Questions were answered to the patient's satisfaction.     Kerin Salen

## 2013-04-25 NOTE — Anesthesia Procedure Notes (Addendum)
Anesthesia Regional Block:  Adductor canal block  Pre-Anesthetic Checklist: ,, timeout performed, Correct Patient, Correct Site, Correct Laterality, Correct Procedure, Correct Position, site marked, Risks and benefits discussed,  Surgical consent,  Pre-op evaluation,  At surgeon's request and post-op pain management  Laterality: Right and Lower  Prep: chloraprep       Needles:  Injection technique: Single-shot  Needle Type: Echogenic Needle     Needle Length: 9cm 9 cm Needle Gauge: 21 and 21 G    Additional Needles:  Procedures: ultrasound guided (picture in chart) Adductor canal block Narrative:  Start time: 04/25/2013 9:32 AM End time: 04/25/2013 9:38 AM Injection made incrementally with aspirations every 5 mL.  Performed by: Personally  Anesthesiologist: Lorrene Reid, MD   Procedure Name: LMA Insertion Date/Time: 04/25/2013 10:30 AM Performed by: Terrill Mohr Pre-anesthesia Checklist: Patient identified, Emergency Drugs available, Suction available and Patient being monitored Patient Re-evaluated:Patient Re-evaluated prior to inductionOxygen Delivery Method: Circle system utilized Preoxygenation: Pre-oxygenation with 100% oxygen Intubation Type: IV induction Ventilation: Mask ventilation without difficulty LMA: LMA inserted LMA Size: 5.0 Number of attempts: 1 Placement Confirmation: breath sounds checked- equal and bilateral and positive ETCO2 ETT to lip (cm): taped across cheeks; gauze roll b/t teeth. Tube secured with: Tape Dental Injury: Teeth and Oropharynx as per pre-operative assessment

## 2013-04-26 DIAGNOSIS — M1710 Unilateral primary osteoarthritis, unspecified knee: Secondary | ICD-10-CM

## 2013-04-26 DIAGNOSIS — IMO0002 Reserved for concepts with insufficient information to code with codable children: Secondary | ICD-10-CM

## 2013-04-26 LAB — COMPREHENSIVE METABOLIC PANEL
ALK PHOS: 35 U/L — AB (ref 39–117)
ALT: 21 U/L (ref 0–53)
AST: 21 U/L (ref 0–37)
Albumin: 3.3 g/dL — ABNORMAL LOW (ref 3.5–5.2)
BILIRUBIN TOTAL: 0.8 mg/dL (ref 0.3–1.2)
BUN: 13 mg/dL (ref 6–23)
CHLORIDE: 93 meq/L — AB (ref 96–112)
CO2: 26 mEq/L (ref 19–32)
Calcium: 8.1 mg/dL — ABNORMAL LOW (ref 8.4–10.5)
Creatinine, Ser: 0.95 mg/dL (ref 0.50–1.35)
GFR calc Af Amer: >90 mL/min (ref >90)
GFR calc non Af Amer: 85 mL/min — ABNORMAL LOW (ref >90)
Glucose, Bld: 139 mg/dL — ABNORMAL HIGH (ref 70–99)
POTASSIUM: 4 meq/L (ref 3.7–5.3)
SODIUM: 134 meq/L — AB (ref 137–147)
TOTAL PROTEIN: 6.5 g/dL (ref 6.0–8.3)

## 2013-04-26 LAB — GLUCOSE, CAPILLARY
GLUCOSE-CAPILLARY: 161 mg/dL — AB (ref 70–99)
GLUCOSE-CAPILLARY: 156 mg/dL — AB (ref 70–99)
Glucose-Capillary: 169 mg/dL — ABNORMAL HIGH (ref 70–99)
Glucose-Capillary: 149 mg/dL — ABNORMAL HIGH (ref 70–99)

## 2013-04-26 LAB — CBC
HCT: 33.9 % — ABNORMAL LOW (ref 39.0–52.0)
HEMOGLOBIN: 11.3 g/dL — AB (ref 13.0–17.0)
MCH: 30.6 pg (ref 26.0–34.0)
MCHC: 33.3 g/dL (ref 30.0–36.0)
MCV: 91.9 fL (ref 78.0–100.0)
Platelets: 198 10*3/uL (ref 150–400)
RBC: 3.69 MIL/uL — AB (ref 4.22–5.81)
RDW: 13.6 % (ref 11.5–15.5)
WBC: 13.1 10*3/uL — ABNORMAL HIGH (ref 4.0–10.5)

## 2013-04-26 LAB — URINALYSIS, ROUTINE W REFLEX MICROSCOPIC
Bilirubin Urine: NEGATIVE
GLUCOSE, UA: NEGATIVE mg/dL
Hgb urine dipstick: NEGATIVE
Ketones, ur: NEGATIVE mg/dL
LEUKOCYTES UA: NEGATIVE
NITRITE: NEGATIVE
PH: 5 (ref 5.0–8.0)
Protein, ur: NEGATIVE mg/dL
SPECIFIC GRAVITY, URINE: 1.014 (ref 1.005–1.030)
Urobilinogen, UA: 0.2 mg/dL (ref 0.0–1.0)

## 2013-04-26 LAB — PROTIME-INR
INR: 1.12 (ref 0.00–1.49)
Prothrombin Time: 14.2 seconds (ref 11.6–15.2)

## 2013-04-26 LAB — LIPASE, BLOOD: Lipase: 20 U/L (ref 11–59)

## 2013-04-26 LAB — AMYLASE: AMYLASE: 30 U/L (ref 0–105)

## 2013-04-26 NOTE — Progress Notes (Signed)
Physical Therapy Treatment Patient Details Name: Christopher Patel MRN: [id number] DOB: 1945-12-24 Today's Date: 04/26/2013    History of Present Illness Patient is a 68 yo male s/p Rt TKA.    PT Comments    Patient able to amb very little this session limitied by pain and lethery. Patient unable to participate in there ex due to patient falling asleep in chair. Continue working with patient to increase safety with mobility. Continue to recommend SNF.   Follow Up Recommendations  SNF     Equipment Recommendations  Rolling walker with 5" wheels;3in1 (PT)    Recommendations for Other Services       Precautions / Restrictions Precautions Precautions: Knee Precaution Booklet Issued:  (in room) Precaution Comments: Reviewed precautions with pt Restrictions Weight Bearing Restrictions: Yes RLE Weight Bearing: Weight bearing as tolerated    Mobility  Bed Mobility Overal bed mobility: Needs Assistance Bed Mobility: Supine to Sit     Supine to sit: Min assist (for RLE off bed)     General bed mobility comments: Patient in chair upon arrival   Transfers Overall transfer level: Needs assistance Equipment used: Rolling walker (2 wheeled) Transfers: Sit to/from Stand Sit to Stand: Min assist Stand pivot transfers: Min assist       General transfer comment: VC's for hand placement and safe use of RW. Pt required assist to power-up to stand. Cues to comtrol descent into recliner. A with LE with decent into recliner.  Ambulation/Gait Ambulation/Gait assistance: Min assist Ambulation Distance (Feet): 6 Feet Assistive device: Rolling walker (2 wheeled) Gait Pattern/deviations: Step-to pattern;Trunk flexed Gait velocity: decreased   General Gait Details: Patient educated on proper gait sequence and technique and use of RW. Pt. with increased weight in UE and flexed posture.   Stairs            Wheelchair Mobility    Modified Rankin (Stroke Patients Only)        Balance                                    Cognition Arousal/Alertness: Lethargic Behavior During Therapy: WFL for tasks assessed/performed Overall Cognitive Status: Within Functional Limits for tasks assessed                      Exercises Total Joint Exercises Ankle Circles/Pumps: AROM;Both;10 reps;Seated Quad Sets: AAROM;Seated;Right;5 reps    General Comments        Pertinent Vitals/Pain Pt. C/o pain, unrated. Repositioned for comfort.     Home Living Family/patient expects to be discharged to:: Skilled nursing facility ([place]) Living Arrangements: Alone           Home Equipment: None      Prior Function Level of Independence: Independent          PT Goals (current goals can now be found in the care plan section) Acute Rehab PT Goals Patient Stated Goal: To walk Progress towards PT goals: Progressing toward goals    Frequency  7X/week    PT Plan Current plan remains appropriate    Co-evaluation             End of Session Equipment Utilized During Treatment: Gait belt;Oxygen Activity Tolerance: Patient limited by lethargy Patient left: in chair;with call bell/phone within reach;with nursing/sitter in room     Time: 1045-1105 PT Time Calculation (min): 20 min  Charges:  G Codes:      Christopher Patel, SPTA 04/26/2013, 11:14 AM

## 2013-04-26 NOTE — Care Management Note (Signed)
CARE MANAGEMENT NOTE 04/26/2013  Patient:  Christopher Patel, Christopher Patel   Account Number:  [id number]  Date Initiated:  04/26/2013  Documentation initiated by:  Ricki Miller  Subjective/Objective Assessment:   68 yr old male admitted with right knee arthritis. S/p right total knee arthroplasty.     Action/Plan:   Patient is for shortterm rehab at [facility] to go to [facility]. Social Worker notified.   Anticipated DC Date:  04/29/2013   Anticipated DC Plan:  SKILLED NURSING FACILITY  In-house referral  Clinical Social Worker      DC Planning Services  CM consult      Choice offered to / List presented to:             Status of service:  Completed, signed off Medicare Important Message given?   (If response is "NO", the following Medicare IM given date fields will be blank) Date Medicare IM given:   Date Additional Medicare IM given:    Discharge Disposition:  [place]

## 2013-04-26 NOTE — Progress Notes (Signed)
PA phoned for pt concerns. Pending hospitalist consult.

## 2013-04-26 NOTE — Progress Notes (Signed)
Clinical Social Work Department CLINICAL SOCIAL WORK PLACEMENT NOTE 04/26/2013  Patient:  Christopher Patel, Christopher Patel  Account Number:  [id number] Admit date:  04/25/2013  Clinical Social Worker:  Blima Rich, Latanya Presser  Date/time:  04/26/2013 07:12 PM  Clinical Social Work is seeking post-discharge placement for this patient at the following level of care:   [place]   (*CSW will update this form in Epic as items are completed)   04/26/2013  Patient/family provided with [place] Department of Clinical Social Work's list of facilities offering this level of care within the geographic area requested by the patient (or if unable, by the patient's family).  04/26/2013  Patient/family informed of their freedom to choose among providers that offer the needed level of care, that participate in Medicare, Medicaid or managed care program needed by the patient, have an available bed and are willing to accept the patient.  04/26/2013  Patient/family informed of [facility]' ownership interest in [facility], as well as of the fact that they are under no obligation to receive care at this facility.  PASARR submitted to [employer] on 04/26/2013 PASARR number received from [employer] on 04/26/2013  FL2 transmitted to all facilities in geographic area requested by pt/family on  04/26/2013 FL2 transmitted to all facilities within larger geographic area on   Patient informed that his/her managed care company has contracts with or will negotiate with  certain facilities, including the following:     Patient/family informed of bed offers received:   Patient chooses bed at  Physician recommends and patient chooses bed at    Patient to be transferred to  on   Patient to be transferred to facility by   The following physician request were entered in Epic:   Additional Comments:

## 2013-04-26 NOTE — Progress Notes (Signed)
Patient c/o luq pain, states this has been recurring for a few days. The pain is described as stabbing and intermittent.

## 2013-04-26 NOTE — Consult Note (Addendum)
PCP:   Lujean Amel, MD   Consult requested by: Joanell Rising  Chief Complaint:  Abdominal pain  HPI: This is a 68 y/o male here for TKR right sided post op day #1 who c/o right middle quadrant abdominal pain since yesterday. Patient states the pain doesn't radiate. He describes the pain as sharp and intermittent. Ranks pain at 7-8/10. He has had it before approximately 2-3 weeks ago, then resolved, it resurged again yesterday. He denies any any nausea, vomiting, diarrhea, burning on urination, fevers or chills. He denies any h/o gallbladder illness or chronic abdominal pain.  Review of Systems:  The patient denies anorexia, fever, weight loss,, vision loss, decreased hearing, hoarseness, chest pain, syncope, dyspnea on exertion, peripheral edema, balance deficits, hemoptysis, melena, hematochezia, severe indigestion/heartburn, hematuria, incontinence, genital sores, muscle weakness, suspicious skin lesions, transient blindness, difficulty walking, depression, unusual weight change, abnormal bleeding, enlarged lymph nodes, angioedema, and breast masses.  Past Medical History: Past Medical History  Diagnosis Date  . Hyperlipidemia   . CAD (coronary artery disease)   . Depression   . ED (erectile dysfunction)   . Lung nodule   . Hepatic lesion     left lobe  . Tubular adenoma     polyps- Dr.Edwards  . Pneumonia   . Nasal congestion   . Change in voice   . Cough   . Thyroid disease     thyroid lesion  . Diabetes mellitus 04-25-11    oral meds  . Osteoarthritis 04-25-11    spine area-some neck issues  . Hypothyroidism   . PONV (postoperative nausea and vomiting)   . Hypertension     dr Marilynn Rail   Past Surgical History  Procedure Laterality Date  . Knee surgery  04-25-11    right/ left knee scope  . Eye surgery  11/2010    cataract x2  . Eye surgery  12/2010    len implant x2   . Thyroidectomy  05/02/2011    Procedure: THYROIDECTOMY;  Surgeon: Earnstine Regal, MD;   Location: [facility];  Service: General;  Laterality: N/A;  Total Thyroidectomy  . Cardiac catheterization  04-25-11    many yrs ago-very slight blockage 1 vessel (60% RCA by Dr. Dorthy Cooler notes)    Medications: Prior to Admission medications   Medication Sig Start Date End Date Taking? Authorizing Provider  amLODipine (NORVASC) 10 MG tablet Take 10 mg by mouth daily.   Yes Historical Provider, MD  aspirin 81 MG tablet Take 81 mg by mouth daily with breakfast.    Yes Historical Provider, MD  atorvastatin (LIPITOR) 80 MG tablet Take 40 mg by mouth at bedtime.   Yes Historical Provider, MD  carvedilol (COREG) 3.125 MG tablet Take 3.125 mg by mouth 2 (two) times daily with a meal.   Yes Historical Provider, MD  fexofenadine (ALLEGRA) 180 MG tablet Take 180 mg by mouth daily as needed. Allergies    Yes Historical Provider, MD  fluticasone (VERAMYST) 27.5 MCG/SPRAY nasal spray Place 2 sprays into the nose daily as needed for allergies.    Yes Historical Provider, MD  glimepiride (AMARYL) 4 MG tablet Take 8 mg by mouth daily with breakfast.   Yes Historical Provider, MD  hydrochlorothiazide (HYDRODIURIL) 25 MG tablet Take 25 mg by mouth daily.   Yes Historical Provider, MD  levothyroxine (SYNTHROID, LEVOTHROID) 150 MCG tablet Take 150 mcg by mouth daily before breakfast.   Yes Historical Provider, MD  losartan (COZAAR) 100 MG tablet Take 100  mg by mouth daily.    Yes Historical Provider, MD  metFORMIN (GLUCOPHAGE-XR) 500 MG 24 hr tablet Take 1,000 mg by mouth 2 (two) times daily.   Yes Historical Provider, MD  pantoprazole (PROTONIX) 40 MG tablet Take 40 mg by mouth daily.   Yes Historical Provider, MD  aspirin EC 325 MG tablet Take 1 tablet (325 mg total) by mouth 2 (two) times daily. 04/25/13   Leighton Parody, PA-C  methocarbamol (ROBAXIN) 500 MG tablet Take 1 tablet (500 mg total) by mouth 2 (two) times daily with a meal. 04/25/13   Leighton Parody, PA-C  oxyCODONE-acetaminophen (ROXICET) 5-325 MG per  tablet Take 1 tablet by mouth every 4 (four) hours as needed. 04/25/13   Leighton Parody, PA-C    Allergies:   Allergies  Allergen Reactions  . Codeine Itching  . Actos [Pioglitazone Hydrochloride] Nausea Only  . Glyburide     hypoglycemia  . Metformin And Related     Hypoglycemia, shakes    Social History:  reports that he quit smoking about 3 years ago. He has never used smokeless tobacco. He reports that he drinks alcohol. He reports that he does not use illicit drugs.  Family History: Family History  Problem Relation Age of Onset  . COPD Other   . Hypertension Other   . Hyperlipidemia Other   . Heart attack Other   . Cancer Brother     colon    Physical Exam: Filed Vitals:   04/26/13 0617 04/26/13 0800 04/26/13 1200 04/26/13 1324  BP: 117/64   131/71  Pulse: 82   86  Temp: 98.2 F (36.8 C)   98.3 F (36.8 C)  TempSrc: Oral   Oral  Resp: 16 16 18 18   Weight:      SpO2: 94% 94% 92% 97%    General:  Alert and oriented times three, well developed and nourished, no acute distress Eyes: PERRLA, pink conjunctiva, no scleral icterus ENT: Moist oral mucosa, neck supple, no thyromegaly Lungs: clear to ascultation, no wheeze, no crackles, no use of accessory muscles Cardiovascular: regular rate and rhythm, no regurgitation, no gallops, no murmurs. No carotid bruits, no JVD Abdomen: soft, positive BS, no significant abdominal tenderness elicited, non-distended, no organomegaly, not an acute abdomen GU: not examined Neuro: CN II - XII grossly intact, sensation intact Musculoskeletal: strength 5/5 all extremities, no clubbing, cyanosis or edema Skin: no rash, no subcutaneous crepitation, no decubitus Psych: appropriate patient   Labs on Admission:  No results found for this basename: NA, K, CL, CO2, GLUCOSE, BUN, CREATININE, CALCIUM, MG, PHOS,  in the last 72 hours No results found for this basename: AST, ALT, ALKPHOS, BILITOT, PROT, ALBUMIN,  in the last 72 hours No  results found for this basename: LIPASE, AMYLASE,  in the last 72 hours  Recent Labs  04/26/13 0505  WBC 13.1*  HGB 11.3*  HCT 33.9*  MCV 91.9  PLT 198   No results found for this basename: CKTOTAL, CKMB, CKMBINDEX, TROPONINI,  in the last 72 hours No components found with this basename: POCBNP,  No results found for this basename: DDIMER,  in the last 72 hours  Recent Labs  04/25/13 1825  HGBA1C 7.2*   No results found for this basename: CHOL, HDL, LDLCALC, TRIG, CHOLHDL, LDLDIRECT,  in the last 72 hours No results found for this basename: TSH, T4TOTAL, FREET3, T3FREE, THYROIDAB,  in the last 72 hours No results found for this basename: VITAMINB12, FOLATE, FERRITIN, TIBC,  IRON, RETICCTPCT,  in the last 72 hours  Micro Results: No results found for this or any previous visit (from the past 240 hour(s)).   Radiological Exams on Admission: No results found.  Assessment/Plan Present on Admission:  . Abdominal pain -no clear etiology, abdominal examination fairly benign -labs ordered CMP, amylase, lipase, UA, INR -CT abdomen and pelvis with contrast -PRN pain meds Diabetes HTN CAD Hypothyroidism -continue current meds -ADA diet and SSI S/P TKR right   Kenyon Eichelberger 04/26/2013, 9:03 PM

## 2013-04-26 NOTE — Progress Notes (Signed)
Patient removes iv tubing from iv extension in order to go to restroom after visitors leave. He states he waited too long and have to go immediately. Rh 22g  In. Pt states I must contact MD regarding left upper quadrant pain, states he believes it to be his gallbladder.

## 2013-04-26 NOTE — Progress Notes (Signed)
Occupational Therapy Evaluation and Discharge Patient Details Name: Christopher Patel MRN: [id number] DOB: [birth year] Today's Date: 04/26/2013    History of Present Illness Patient is a 68 yo male s/p Rt TKA.   Clinical Impression   PTA pt lived at home alone and was independent with ADLs and mobility. Pt with hx of B knee problems. Education and training provided for compensatory techniques for LB ADLs. Pt transferred to recliner and performed grooming tasks. Pt with decreased caregiver support and feel he would benefit from skilled OT at SNF for safety before returning home. All further OT needs can be met at next venue of care (SNF). Acute OT to sign off.     Follow Up Recommendations  SNF;Supervision/Assistance - 24 hour ([place]- per pt)    Equipment Recommendations  Other (comment) (TBD by SNF ([facility]))       Precautions / Restrictions Precautions Precautions: Knee Precaution Booklet Issued:  (in room) Precaution Comments: Reviewed precautions with pt Restrictions Weight Bearing Restrictions: Yes RLE Weight Bearing: Weight bearing as tolerated      Mobility Bed Mobility Overal bed mobility: Needs Assistance Bed Mobility: Supine to Sit     Supine to sit: Min assist (for RLE off bed)     General bed mobility comments: VC's for technique; assist for RLE off bed. Good sitting balance EOB  Transfers Overall transfer level: Needs assistance Equipment used: Rolling walker (2 wheeled) Transfers: Sit to/from [facility] Sit to Stand: Min assist Stand pivot transfers: Min assist       General transfer comment: VC's for hand placement and safe use of RW. Pt required assist to power-up to stand. Pt able to take steps and pivot to recliner         ADL Overall ADL's : Needs assistance/impaired Eating/Feeding: Independent;Sitting   Grooming: Set up;Sitting   Upper Body Bathing: Set up;Sitting   Lower Body Bathing: Minimal assistance;Sit to/from  stand   Upper Body Dressing : Set up;Sitting   Lower Body Dressing: Moderate assistance;Sit to/from stand   Toilet Transfer: Minimal assistance;Stand-pivot;Ambulation;RW (Sit<>Stand from bed to recliner)   Toileting- Clothing Manipulation and Hygiene: Minimal assistance;Sit to/from stand       Functional mobility during ADLs: Min guard;Rolling walker                 Pertinent Vitals/Pain Pt reports 7-8/10 pain in R knee. RN notified; repositioned in chair and applied ice. Pt O2 sats dropped following transfer, however with rest and use of spirometer O2 sats reached a steady 98%.      Hand Dominance Right   Extremity/Trunk Assessment Upper Extremity Assessment Upper Extremity Assessment: Overall WFL for tasks assessed   Lower Extremity Assessment Lower Extremity Assessment: Defer to PT evaluation   Cervical / Trunk Assessment Cervical / Trunk Assessment: Normal   Communication Communication Communication: No difficulties   Cognition Arousal/Alertness: Awake/alert Behavior During Therapy: WFL for tasks assessed/performed Overall Cognitive Status: Within Functional Limits for tasks assessed                                Home Living Family/patient expects to be discharged to:: Skilled nursing facility ([facility]) Living Arrangements: Alone                           Home Equipment: None          Prior Functioning/Environment Level of  Independence: Independent                                       End of Session Equipment Utilized During Treatment: Gait belt;Rolling walker CPM Right Knee CPM Right Knee: Off   Activity Tolerance: Patient tolerated treatment well Patient left: in chair;with call bell/phone within reach   Time: 0912-0956 OT Time Calculation (min): 44 min Charges:  OT General Charges $OT Visit: 1 Procedure OT Evaluation $Initial OT Evaluation Tier I: 1 Procedure OT Treatments $Self Care/Home  Management : 23-37 mins  Christopher Patel [phone] 04/26/2013, 10:03 AM

## 2013-04-26 NOTE — Progress Notes (Signed)
Patient ID: Mort Sawyers, male   DOB: Nov 03, 1945, 68 y.o.   MRN: [id number] PATIENT ID: KAYE MITRO  MRN: [id number]  DOB/AGE:  11/16/1945 / 68 y.o.  1 Day Post-Op Procedure(s) (LRB): TOTAL KNEE ARTHROPLASTY (Right)    PROGRESS NOTE Subjective: Patient is alert, oriented, no Nausea, no Vomiting, yes passing gas, no Bowel Movement. Taking PO well. Denies SOB, Chest or Calf Pain. Using Incentive Spirometer, PAS in place. Ambulate WBAT, CPM 0-70 Patient reports pain as 4 on 0-10 scale  .    Objective: Vital signs in last 24 hours: Filed Vitals:   04/26/13 0000 04/26/13 0127 04/26/13 0320 04/26/13 0617  BP:  123/61  117/64  Pulse:  64  82  Temp:  98.2 F (36.8 C)  98.2 F (36.8 C)  TempSrc:  Oral  Oral  Resp: 18 18 18 16   Weight:      SpO2: 96% 96% 96% 94%      Intake/Output from previous day: I/O last 3 completed shifts: In: 2769.6 [P.O.:480; I.V.:2289.6] Out: 880 [Urine:270; Drains:560; Blood:50]   Intake/Output this shift:     LABORATORY DATA:  Recent Labs  04/25/13 1637 04/25/13 2126 04/26/13 0505 04/26/13 0632  WBC  --   --  13.1*  --   HGB  --   --  11.3*  --   HCT  --   --  33.9*  --   PLT  --   --  198  --   GLUCAP 156* 188*  --  169*    Examination: Neurologically intact ABD soft Neurovascular intact Sensation intact distally Intact pulses distally Dorsiflexion/Plantar flexion intact Incision: no drainage No cellulitis present Compartment soft} Blood and plasma separated in drain indicating minimal recent drainage, drain pulled without difficulty.  Assessment:   1 Day Post-Op Procedure(s) (LRB): TOTAL KNEE ARTHROPLASTY (Right) ADDITIONAL DIAGNOSIS:  Diabetes and Hypertension  Plan: PT/OT WBAT, CPM 5/hrs day until ROM 0-90 degrees, then D/C CPM DVT Prophylaxis:  SCDx72hrs, ASA 325 mg BID x 2 weeks DISCHARGE PLAN: Skilled Nursing Facility/Rehab, passed in place probably in 2 days for a Medicare protocol DISCHARGE NEEDS: HHPT, HHRN, CPM, Walker  and 3-in-1 comode seat     Jemario Poitras J 04/26/2013, 8:26 AM

## 2013-04-26 NOTE — Progress Notes (Signed)
Clinical Social Work Department BRIEF PSYCHOSOCIAL ASSESSMENT 04/26/2013  Patient:  KARMA, HINEY     Account Number:  [id number]     Admit date:  04/25/2013  Clinical Social Worker:  Rolinda Roan  Date/Time:  04/26/2013 07:09 PM  Referred by:  Physician  Date Referred:  04/25/2013 Referred for  SNF Placement   Other Referral:   Interview type:  Patient Other interview type:    PSYCHOSOCIAL DATA Living Status:  WITH ADULT CHILDREN Admitted from facility:   Level of care:   Primary support name:  Etheleen Sia Primary support relationship to patient:  CHILD, ADULT Degree of support available:   Good support per patient.    CURRENT CONCERNS  Other Concerns:    SOCIAL WORK ASSESSMENT / PLAN Clinical Social Worker (CSW) met with patient to discuss SNF placement. Patient reported that he wants to go to [facility]. CSW encouraged patient to pick a back up SNF in case [facility] not have a bed. Patient verbalized his understanding.   Assessment/plan status:  Psychosocial Support/Ongoing Assessment of Needs Other assessment/ plan:   Information/referral to community resources:   CSW gave patient SNF list.    PATIENT'S/FAMILY'S RESPONSE TO PLAN OF CARE: Patient thanked CSW for visit and starting placement process.

## 2013-04-27 ENCOUNTER — Inpatient Hospital Stay (HOSPITAL_COMMUNITY): Payer: Medicare HMO

## 2013-04-27 DIAGNOSIS — R109 Unspecified abdominal pain: Secondary | ICD-10-CM

## 2013-04-27 DIAGNOSIS — E890 Postprocedural hypothyroidism: Secondary | ICD-10-CM

## 2013-04-27 DIAGNOSIS — E119 Type 2 diabetes mellitus without complications: Secondary | ICD-10-CM

## 2013-04-27 LAB — CBC
HCT: 29.8 % — ABNORMAL LOW (ref 39.0–52.0)
Hemoglobin: 9.9 g/dL — ABNORMAL LOW (ref 13.0–17.0)
MCH: 30.6 pg (ref 26.0–34.0)
MCHC: 33.2 g/dL (ref 30.0–36.0)
MCV: 92 fL (ref 78.0–100.0)
Platelets: 154 K/uL (ref 150–400)
RBC: 3.24 MIL/uL — ABNORMAL LOW (ref 4.22–5.81)
RDW: 13.5 % (ref 11.5–15.5)
WBC: 11.7 K/uL — ABNORMAL HIGH (ref 4.0–10.5)

## 2013-04-27 LAB — GLUCOSE, CAPILLARY
GLUCOSE-CAPILLARY: 141 mg/dL — AB (ref 70–99)
GLUCOSE-CAPILLARY: 114 mg/dL — AB (ref 70–99)
Glucose-Capillary: 138 mg/dL — ABNORMAL HIGH (ref 70–99)
Glucose-Capillary: 140 mg/dL — ABNORMAL HIGH (ref 70–99)

## 2013-04-27 MED ORDER — METFORMIN HCL ER 500 MG PO TB24
1000.0000 mg | ORAL_TABLET | Freq: Two times a day (BID) | ORAL | Status: DC
  Filled 2013-04-27: qty 2

## 2013-04-27 MED ORDER — IOHEXOL 300 MG/ML  SOLN
100.0000 mL | Freq: Once | INTRAMUSCULAR | Status: AC | PRN
  Administered 2013-04-27: 100 mL via INTRAVENOUS

## 2013-04-27 MED ORDER — POLYETHYLENE GLYCOL 3350 17 G PO PACK: 17.0000 g | PACK | Freq: Every day | ORAL | Status: DC | PRN

## 2013-04-27 MED ORDER — IOHEXOL 300 MG/ML  SOLN: 25.0000 mL | INTRAMUSCULAR | Status: AC

## 2013-04-27 NOTE — Progress Notes (Addendum)
PROGRESS NOTE  Christopher Patel ZOX:[id number] DOB: Mar 30, 1945 DOA: 04/25/2013 PCP: Lujean Amel, MD  Assessment/Plan: Abdominal pain - suspect constipation - CMP ok, amylase, lipase ok, UA, INR ok  -CT abdomen and pelvis with contrast -adrenal protocol MRI Recommended- can be done as OUTPATIENT.  Passing gas -add laxitive -PRN pain meds   Diabetes- poorly controlled- refer to PCP  -SSI  HTN   CAD   Hypothyroidism  -continue current meds    S/P TKR right    HPI/Subjective: Passing gas this AM -abd pain improved  Objective: Filed Vitals:   04/27/13 0533  BP: 123/64  Pulse: 97  Temp: 99.1 F (37.3 C)  Resp: 18    Intake/Output Summary (Last 24 hours) at 04/27/13 0813 Last data filed at 04/27/13 0534  Gross per 24 hour  Intake   2460 ml  Output    450 ml  Net   2010 ml   Filed Weights   04/25/13 0901  Weight: 93.101 kg (205 lb 4 oz)    Exam:   General:  A+Ox3, NAD  Cardiovascular: rrr  Respiratory: clear anterior  Abdomen: +BS, distended, obese  Musculoskeletal: right leg wrapped   Data Reviewed: Basic Metabolic Panel:  Recent Labs Lab 04/26/13 2215  NA 134*  K 4.0  CL 93*  CO2 26  GLUCOSE 139*  BUN 13  CREATININE 0.95  CALCIUM 8.1*   Liver Function Tests:  Recent Labs Lab 04/26/13 2215  AST 21  ALT 21  ALKPHOS 35*  BILITOT 0.8  PROT 6.5  ALBUMIN 3.3*    Recent Labs Lab 04/26/13 2215  LIPASE 20  AMYLASE 30   No results found for this basename: AMMONIA,  in the last 168 hours CBC:  Recent Labs Lab 04/26/13 0505 04/27/13 0446  WBC 13.1* 11.7*  HGB 11.3* 9.9*  HCT 33.9* 29.8*  MCV 91.9 92.0  PLT 198 154   Cardiac Enzymes: No results found for this basename: CKTOTAL, CKMB, CKMBINDEX, TROPONINI,  in the last 168 hours BNP (last 3 results) No results found for this basename: PROBNP,  in the last 8760 hours CBG:  Recent Labs Lab 04/26/13 0632 04/26/13 1104 04/26/13 1559 04/26/13 2121 04/27/13 0650  GLUCAP  169* 161* 156* 149* 138*    No results found for this or any previous visit (from the past 240 hour(s)).   Studies: No results found.  Scheduled Meds: . amLODipine  10 mg Oral Daily  . aspirin EC  325 mg Oral Q breakfast  . atorvastatin  40 mg Oral QHS  . carvedilol  3.125 mg Oral BID WC  . docusate sodium  100 mg Oral BID  . glimepiride  8 mg Oral Q breakfast  . hydrochlorothiazide  25 mg Oral Daily  . insulin aspart  0-15 Units Subcutaneous TID WC  . levothyroxine  150 mcg Oral QAC breakfast  . loratadine  10 mg Oral Daily  . losartan  100 mg Oral Daily  . [START ON 04/29/2013] metFORMIN  1,000 mg Oral BID WC  . pantoprazole  40 mg Oral Q breakfast   Continuous Infusions: . dextrose 5 % and 0.45 % NaCl with KCl 20 mEq/L 125 mL/hr at 04/26/13 0410   Antibiotics Given (last 72 hours)   Date/Time Action Medication Dose   04/25/13 1030 Given   ceFAZolin (ANCEF) IVPB 2 g/50 mL premix 2 g   04/25/13 1104 Given  [added to bone cement]   cefUROXime (ZINACEF) injection 1.5 g      Active Problems:  Arthritis of right knee    Time spent: 35 min    Shaquinta Peruski  [facility] Pager [phone]. If 7PM-7AM, please contact night-coverage at [email or web address], password [facility] 04/27/2013, 8:13 AM  LOS: 2 days

## 2013-04-27 NOTE — Progress Notes (Signed)
Physical Therapy Treatment Patient Details Name: Christopher Patel MRN: [id number] DOB: 30-Jun-1945 Today's Date: 04/27/2013    History of Present Illness Patient is a 68 yo male s/p Rt TKA.    PT Comments    Very painful with ROM; Still, continues to make progress with mobility; Noted nice R stance stability with no knee buckle and able to incr amb distance  Follow Up Recommendations  SNF     Equipment Recommendations  Rolling walker with 5" wheels;3in1 (PT)    Recommendations for Other Services       Precautions / Restrictions Precautions Precautions: Knee Precaution Comments: Reviewed precautions with pt Restrictions Weight Bearing Restrictions: No RLE Weight Bearing: Weight bearing as tolerated    Mobility  Bed Mobility Overal bed mobility: Needs Assistance Bed Mobility: Supine to Sit     Supine to sit: Min assist     General bed mobility comments: Min assist for RLE off of bed  Transfers Overall transfer level: Needs assistance Equipment used: Rolling walker (2 wheeled) Transfers: Sit to/from Stand Sit to Stand: Min guard         General transfer comment: VC's for hand placement and safe use of RW. Pt required assist to power-up to stand. Cues to control descent into recliner.   Ambulation/Gait Ambulation/Gait assistance: Min guard Ambulation Distance (Feet): 60 Feet Assistive device: Rolling walker (2 wheeled) Gait Pattern/deviations: Step-to pattern;Decreased stance time - right Gait velocity: decreased   General Gait Details: Cues for gait sequence, and to incr step length R and L for more efficient gait pattern; Cues also for upright posture, and to self-monitor for R knee stance stability; no knee buckling noted   Stairs            Wheelchair Mobility    Modified Rankin (Stroke Patients Only)       Balance                                    Cognition Arousal/Alertness: Awake/alert Behavior During Therapy: WFL for tasks  assessed/performed Overall Cognitive Status: Within Functional Limits for tasks assessed                      Exercises Total Joint Exercises Ankle Circles/Pumps: AROM;Both;15 reps Quad Sets: AROM;Right;15 reps Short Arc Quad: AAROM;AROM;Right;10 reps Heel Slides: AAROM;Right;10 reps Straight Leg Raises: AAROM;AROM;Right;10 reps Goniometric ROM: 0-70 deg; Extremely painful at end-range flexion    General Comments        Pertinent Vitals/Pain 7/10 R knee with therex and amb patient repositioned for comfort and Optimal knee extension     Home Living                      Prior Function            PT Goals (current goals can now be found in the care plan section) Acute Rehab PT Goals Patient Stated Goal: To walk PT Goal Formulation: With patient/family Time For Goal Achievement: 05/02/13 Potential to Achieve Goals: Good Progress towards PT goals: Progressing toward goals    Frequency  7X/week    PT Plan Current plan remains appropriate    Co-evaluation             End of Session Equipment Utilized During Treatment: Gait belt Activity Tolerance: Patient tolerated treatment well;Patient limited by pain Patient left: in chair;with call bell/phone within reach  Time: 0037-0488 PT Time Calculation (min): 25 min  Charges:  $Gait Training: 8-22 mins $Therapeutic Exercise: 8-22 mins                    G Codes:      Roney Marion Hamff 04/27/2013, 10:20 AM  Roney Marion, PT  Acute Rehabilitation Services Pager [phone] Office [phone]

## 2013-04-27 NOTE — Progress Notes (Signed)
PATIENT ID: Christopher Patel  MRN: [id number]  DOB/AGE:  1945-06-13 / 68 y.o.  2 Days Post-Op Procedure(s) (LRB): TOTAL KNEE ARTHROPLASTY (Right)    PROGRESS NOTE Subjective: Patient is alert, oriented, no Nausea, no Vomiting, yes passing gas, no Bowel Movement. Taking PO well. Denies SOB, Chest or Calf Pain. Using Incentive Spirometer, PAS in place. Ambulate WBAT, CPM 0-60 Patient reports pain as 7 on 0-10 scale  .    Objective: Vital signs in last 24 hours: Filed Vitals:   04/26/13 1200 04/26/13 1324 04/26/13 2119 04/27/13 0533  BP:  131/71 146/68 123/64  Pulse:  86 95 97  Temp:  98.3 F (36.8 C) 98.3 F (36.8 C) 99.1 F (37.3 C)  TempSrc:  Oral Oral Oral  Resp: 18 18 18 18   Weight:      SpO2: 92% 97% 95% 95%      Intake/Output from previous day: I/O last 3 completed shifts: In: 3789.6 [P.O.:1200; I.V.:2589.6] Out: 940 [Urine:720; Drains:220]   Intake/Output this shift:     LABORATORY DATA:  Recent Labs  04/26/13 0505  04/26/13 1559 04/26/13 2121 04/26/13 2215 04/27/13 0446 04/27/13 0650  WBC 13.1*  --   --   --   --  11.7*  --   HGB 11.3*  --   --   --   --  9.9*  --   HCT 33.9*  --   --   --   --  29.8*  --   PLT 198  --   --   --   --  154  --   NA  --   --   --   --  134*  --   --   K  --   --   --   --  4.0  --   --   CL  --   --   --   --  93*  --   --   CO2  --   --   --   --  26  --   --   BUN  --   --   --   --  13  --   --   CREATININE  --   --   --   --  0.95  --   --   GLUCOSE  --   --   --   --  139*  --   --   GLUCAP  --   < > 156* 149*  --   --  138*  INR  --   --   --   --  1.12  --   --   CALCIUM  --   --   --   --  8.1*  --   --   < > = values in this interval not displayed.  Examination: Neurologically intact ABD soft Neurovascular intact Sensation intact distally Intact pulses distally Dorsiflexion/Plantar flexion intact Incision: dressing C/D/I No cellulitis present Compartment soft}  Assessment:   2 Days Post-Op Procedure(s)  (LRB): TOTAL KNEE ARTHROPLASTY (Right) ADDITIONAL DIAGNOSIS:  Diabetes and Hypertension Abdominal pain - [facility] consulted.  Labs ok to this point.  Awaiting Ct of abdomen, but pt states pain has improved.    Plan: PT/OT WBAT, CPM 5/hrs day until ROM 0-90 degrees, then D/C CPM DVT Prophylaxis:  SCDx72hrs, ASA 325 mg BID x 2 weeks DISCHARGE PLAN: Skilled Nursing Facility/Rehab - [facility] DISCHARGE NEEDS: HHPT, HHRN, CPM, Walker and 3-in-1  comode seat     Mandee Pluta R 04/27/2013, 8:55 AM

## 2013-04-28 ENCOUNTER — Encounter (HOSPITAL_COMMUNITY): Payer: Self-pay | Admitting: Orthopedic Surgery

## 2013-04-28 LAB — CBC
HEMATOCRIT: 27.3 % — AB (ref 39.0–52.0)
Hemoglobin: 9.2 g/dL — ABNORMAL LOW (ref 13.0–17.0)
MCH: 30.8 pg (ref 26.0–34.0)
MCHC: 33.7 g/dL (ref 30.0–36.0)
MCV: 91.3 fL (ref 78.0–100.0)
PLATELETS: 166 10*3/uL (ref 150–400)
RBC: 2.99 MIL/uL — ABNORMAL LOW (ref 4.22–5.81)
RDW: 13.4 % (ref 11.5–15.5)
WBC: 9 10*3/uL (ref 4.0–10.5)

## 2013-04-28 LAB — GLUCOSE, CAPILLARY
GLUCOSE-CAPILLARY: 171 mg/dL — AB (ref 70–99)
Glucose-Capillary: 115 mg/dL — ABNORMAL HIGH (ref 70–99)

## 2013-04-28 NOTE — Progress Notes (Signed)
Patient discharged to [facility], SNF. Report called to Dese[place], RN at facility. IV was removed and dressing changed.

## 2013-04-28 NOTE — Progress Notes (Signed)
Reviewed and Agree;  Roney Marion, PT  Acute Rehabilitation Services Pager [phone] Office [phone]

## 2013-04-28 NOTE — Clinical Social Work Placement (Signed)
     Clinical Social Work Department CLINICAL SOCIAL WORK PLACEMENT NOTE 04/28/2013  Patient:  SOVEREIGN, RAMIRO  Account Number:  [id number] Admit date:  04/25/2013  Clinical Social Worker:  Blima Rich, Latanya Presser  Date/time:  04/26/2013 07:12 PM  Clinical Social Work is seeking post-discharge placement for this patient at the following level of care:   [employer]   (*CSW will update this form in Epic as items are completed)   04/26/2013  Patient/family provided with [place] Department of Clinical Social Works list of facilities offering this level of care within the geographic area requested by the patient (or if unable, by the patients family).  04/26/2013  Patient/family informed of their freedom to choose among providers that offer the needed level of care, that participate in Medicare, Medicaid or managed care program needed by the patient, have an available bed and are willing to accept the patient.  04/26/2013  Patient/family informed of [facility] ownership interest in [facility], as well as of the fact that they are under no obligation to receive care at this facility.  PASARR submitted to EDS on 04/26/2013 PASARR number received from EDS on 04/26/2013  FL2 transmitted to all facilities in geographic area requested by pt/family on  04/26/2013 FL2 transmitted to all facilities within larger geographic area on   Patient informed that his/her managed care company has contracts with or will negotiate with  certain facilities, including the following:   [employer] number given 04/28/13  [phone]     Patient/family informed of bed offers received:  04/28/2013 Patient chooses bed at [facility] Physician recommends and patient chooses bed at    Patient to be transferred to [place] on  04/28/2013 Patient to be transferred to facility by Ambulance  Corey Harold)  The following physician request were entered in Epic:   Additional Comments:

## 2013-04-28 NOTE — Discharge Summary (Signed)
Patient ID: Christopher Patel MRN: [id number] DOB/AGE: 66-Mar-1947 66 y.o.  Admit date: 04/25/2013 Discharge date: 04/28/2013  Admission Diagnoses:  Active Problems:   Hypothyroidism, postsurgical   Arthritis of right knee   Abdominal pain, unspecified site   Discharge Diagnoses:  Same  Past Medical History  Diagnosis Date  . Hyperlipidemia   . CAD (coronary artery disease)   . Depression   . ED (erectile dysfunction)   . Lung nodule   . Hepatic lesion     left lobe  . Tubular adenoma     polyps- Dr.Edwards  . Pneumonia   . Nasal congestion   . Change in voice   . Cough   . Thyroid disease     thyroid lesion  . Diabetes mellitus 04-25-11    oral meds  . Osteoarthritis 04-25-11    spine area-some neck issues  . Hypothyroidism   . PONV (postoperative nausea and vomiting)   . Hypertension     dr Marilynn Rail    Surgeries: Procedure(s): TOTAL KNEE ARTHROPLASTY on 04/25/2013   Consultants:    Discharged Condition: Improved  Hospital Course: Christopher Patel is an 68 y.o. male who was admitted 04/25/2013 for operative treatment of<principal problem not specified>. Patient has severe unremitting pain that affects sleep, daily activities, and work/hobbies. After pre-op clearance the patient was taken to the operating room on 04/25/2013 and underwent  Procedure(s): TOTAL KNEE ARTHROPLASTY.    Patient was given perioperative antibiotics: Anti-infectives   Start     Dose/Rate Route Frequency Ordered Stop   04/25/13 1104  cefUROXime (ZINACEF) injection  Status:  Discontinued       As needed 04/25/13 1105 04/25/13 1216   04/25/13 0600  ceFAZolin (ANCEF) IVPB 2 g/50 mL premix     2 g 100 mL/hr over 30 Minutes Intravenous On call to O.R. 04/24/13 1419 04/25/13 1030       Patient was given sequential compression devices, early ambulation, and chemoprophylaxis to prevent DVT.  Patient benefited maximally from hospital stay and there were no complications.    Recent vital signs:  Patient Vitals for the past 24 hrs:  BP Temp Temp src Pulse Resp SpO2  04/28/13 0800 - - - - 18 -  04/28/13 0552 144/69 mmHg 99.6 F (37.6 C) Oral 100 18 95 %  04/27/13 2050 129/62 mmHg 99.7 F (37.6 C) Oral 100 18 98 %  04/27/13 1346 117/69 mmHg 98 F (36.7 C) Oral 100 18 97 %     Recent laboratory studies:  Recent Labs  04/26/13 0505 04/26/13 2215 04/27/13 0446  WBC 13.1*  --  11.7*  HGB 11.3*  --  9.9*  HCT 33.9*  --  29.8*  PLT 198  --  154  NA  --  134*  --   K  --  4.0  --   CL  --  93*  --   CO2  --  26  --   BUN  --  13  --   CREATININE  --  0.95  --   GLUCOSE  --  139*  --   INR  --  1.12  --   CALCIUM  --  8.1*  --      Discharge Medications:     Medication List    STOP taking these medications       aspirin 81 MG tablet  Replaced by:  aspirin EC 325 MG tablet      TAKE these medications  amLODipine 10 MG tablet  Commonly known as:  NORVASC  Take 10 mg by mouth daily.     aspirin EC 325 MG tablet  Take 1 tablet (325 mg total) by mouth 2 (two) times daily.     atorvastatin 80 MG tablet  Commonly known as:  LIPITOR  Take 40 mg by mouth at bedtime.     carvedilol 3.125 MG tablet  Commonly known as:  COREG  Take 3.125 mg by mouth 2 (two) times daily with a meal.     fexofenadine 180 MG tablet  Commonly known as:  ALLEGRA  - Take 180 mg by mouth daily as needed. Allergies  -      fluticasone 27.5 MCG/SPRAY nasal spray  Commonly known as:  VERAMYST  Place 2 sprays into the nose daily as needed for allergies.     glimepiride 4 MG tablet  Commonly known as:  AMARYL  Take 8 mg by mouth daily with breakfast.     hydrochlorothiazide 25 MG tablet  Commonly known as:  HYDRODIURIL  Take 25 mg by mouth daily.     levothyroxine 150 MCG tablet  Commonly known as:  SYNTHROID, LEVOTHROID  Take 150 mcg by mouth daily before breakfast.     losartan 100 MG tablet  Commonly known as:  COZAAR  Take 100 mg by mouth daily.     metFORMIN 500  MG 24 hr tablet  Commonly known as:  GLUCOPHAGE-XR  Take 1,000 mg by mouth 2 (two) times daily.     methocarbamol 500 MG tablet  Commonly known as:  ROBAXIN  Take 1 tablet (500 mg total) by mouth 2 (two) times daily with a meal.     oxyCODONE-acetaminophen 5-325 MG per tablet  Commonly known as:  ROXICET  Take 1 tablet by mouth every 4 (four) hours as needed.     pantoprazole 40 MG tablet  Commonly known as:  PROTONIX  Take 40 mg by mouth daily.        Diagnostic Studies: Ct Abdomen Pelvis W Contrast  04/27/2013   CLINICAL DATA:  Abdominal pain  EXAM: CT ABDOMEN AND PELVIS WITH CONTRAST  TECHNIQUE: Multidetector CT imaging of the abdomen and pelvis was performed using the standard protocol following bolus administration of intravenous contrast.  CONTRAST:  182mL OMNIPAQUE IOHEXOL 300 MG/ML  SOLN  COMPARISON:  None.  FINDINGS: Mild atelectasis versus scarring within the lung bases.  The liver, spleen, right adrenal, pancreas, kidneys are unremarkable. A 1.8 x 1.2 cm left adrenal nodule is appreciated. The Hounsfield units of this finding are 27.58.  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 The bowel is otherwise negative. The appendix is appreciated and is unremarkable.  No abdominal or pelvic free fluid, loculated fluid collections, further masses, nor adenopathy.  Celiac, SMA, IMA, portal vein, SMV are opacified. Atherosclerotic calcifications are appreciated within the abdominal aorta and iliac vessels. No abdominal aortic aneurysm.  No abdominal wall nor inguinal hernia demonstrated.  Urinary bladder is distended.  There are no aggressive appearing osseous lesions. Multilevel spondylosis is appreciated. Degenerative changes within the right and left hips.  IMPRESSION: 1. Left adrenal nodule differential considerations include an adrenal adenoma. Further characterization with adrenal protocol MRI recommended. 2. Duodenal diverticulum containing air 3. No further evidence of focal  acute abnormalities. No CT evidence accounting for the patient's clinical presentation.   Electronically Signed   By: Margaree Mackintosh M.D.   On: 04/27/2013 10:10    Disposition: 01-Home or Self Care  Discharge Orders   Future Orders Complete By Expires   Call MD / Call 911  As directed    Comments:     If you experience chest pain or shortness of breath, CALL 911 and be transported to the hospital emergency room.  If you develope a fever above 101 F, pus (white drainage) or increased drainage or redness at the wound, or calf pain, call your surgeon's office.   Change dressing  As directed    Comments:     Change dressing on 5, then change the dressing daily with sterile 4 x 4 inch gauze dressing and apply TED hose.  You may clean the incision with alcohol prior to redressing.   Constipation Prevention  As directed    Comments:     Drink plenty of fluids.  Prune juice may be helpful.  You may use a stool softener, such as Colace (over the counter) 100 mg twice a day.  Use MiraLax (over the counter) for constipation as needed.   CPM  As directed    Comments:     Continuous passive motion machine (CPM):      Use the CPM from 0 to 60  for 5 hours per day.      You may increase by 10 degrees per day.  You may break it up into 2 or 3 sessions per day.      Use CPM for 2 weeks or until you are told to stop.   Diet - low sodium heart healthy  As directed    Discharge instructions  As directed    Comments:     adrenal protocol MRI Recommended for follow up on adrenal nodule- please see PCP   Discharge instructions  As directed    Comments:     Follow up in office with Dr. Mayer Camel in 2 weeks.   Driving restrictions  As directed    Comments:     No driving for 2 weeks   Increase activity slowly as tolerated  As directed    Patient may shower  As directed    Comments:     You may shower without a dressing once there is no drainage.  Do not wash over the wound.  If drainage remains, cover  wound with plastic wrap and then shower.      Follow-up Information   Follow up with Kerin Salen, MD In 2 weeks.   Specialty:  Orthopedic Surgery   Contact information:   [place] [id number] [phone]        Signed: Theodosia Quay 04/28/2013, 9:44 AM

## 2013-04-28 NOTE — Progress Notes (Signed)
Seen and agreed 04/28/2013 Jacqualyn Posey PTA [phone] pager [phone] office

## 2013-04-28 NOTE — Progress Notes (Signed)
PROGRESS NOTE  Christopher Patel PYK:[id number] DOB: 1945/12/15 DOA: 04/25/2013 PCP: Lujean Amel, MD  Assessment/Plan: Abdominal pain - suspect constipation- passing gas but no BM yet - CMP ok, amylase, lipase ok, UA, INR ok  -CT abdomen and pelvis with contrast -adrenal protocol MRI Recommended  Passing gas -add laxitive -PRN pain meds   Diabetes- poorly controlled- refer to PCP after lifestyle modifications  -SSI  HTN   CAD   Hypothyroidism  -continue current meds    S/P TKR right  Will sign off- please call if needed [phone]    HPI/Subjective: Passing gas this AM No abd pain  Objective: Filed Vitals:   04/28/13 0552  BP: 144/69  Pulse: 100  Temp: 99.6 F (37.6 C)  Resp: 18    Intake/Output Summary (Last 24 hours) at 04/28/13 0803 Last data filed at 04/28/13 5397  Gross per 24 hour  Intake   1320 ml  Output      0 ml  Net   1320 ml   Filed Weights   04/25/13 0901  Weight: 93.101 kg (205 lb 4 oz)    Exam:   General:  A+Ox3, NAD  Cardiovascular: rrr  Respiratory: clear anterior  Abdomen: +BS, distended, obese  Musculoskeletal: right leg wrapped   Data Reviewed: Basic Metabolic Panel:  Recent Labs Lab 04/26/13 2215  NA 134*  K 4.0  CL 93*  CO2 26  GLUCOSE 139*  BUN 13  CREATININE 0.95  CALCIUM 8.1*   Liver Function Tests:  Recent Labs Lab 04/26/13 2215  AST 21  ALT 21  ALKPHOS 35*  BILITOT 0.8  PROT 6.5  ALBUMIN 3.3*    Recent Labs Lab 04/26/13 2215  LIPASE 20  AMYLASE 30   No results found for this basename: AMMONIA,  in the last 168 hours CBC:  Recent Labs Lab 04/26/13 0505 04/27/13 0446  WBC 13.1* 11.7*  HGB 11.3* 9.9*  HCT 33.9* 29.8*  MCV 91.9 92.0  PLT 198 154   Cardiac Enzymes: No results found for this basename: CKTOTAL, CKMB, CKMBINDEX, TROPONINI,  in the last 168 hours BNP (last 3 results) No results found for this basename: PROBNP,  in the last 8760 hours CBG:  Recent Labs Lab  04/27/13 0650 04/27/13 1141 04/27/13 1606 04/27/13 2117 04/28/13 0553  GLUCAP 138* 141* 140* 114* 115*    No results found for this or any previous visit (from the past 240 hour(s)).   Studies: Ct Abdomen Pelvis W Contrast  04/27/2013   CLINICAL DATA:  Abdominal pain  EXAM: CT ABDOMEN AND PELVIS WITH CONTRAST  TECHNIQUE: Multidetector CT imaging of the abdomen and pelvis was performed using the standard protocol following bolus administration of intravenous contrast.  CONTRAST:  135mL OMNIPAQUE IOHEXOL 300 MG/ML  SOLN  COMPARISON:  None.  FINDINGS: Mild atelectasis versus scarring within the lung bases.  The liver, spleen, right adrenal, pancreas, kidneys are unremarkable. A 1.8 x 1.2 cm left adrenal nodule is appreciated. The Hounsfield units of this finding are 27.58.  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 The bowel is otherwise negative. The appendix is appreciated and is unremarkable.  No abdominal or pelvic free fluid, loculated fluid collections, further masses, nor adenopathy.  Celiac, SMA, IMA, portal vein, SMV are opacified. Atherosclerotic calcifications are appreciated within the abdominal aorta and iliac vessels. No abdominal aortic aneurysm.  No abdominal wall nor inguinal hernia demonstrated.  Urinary bladder is distended.  There are no aggressive appearing osseous lesions. Multilevel spondylosis is appreciated. Degenerative changes  within the right and left hips.  IMPRESSION: 1. Left adrenal nodule differential considerations include an adrenal adenoma. Further characterization with adrenal protocol MRI recommended. 2. Duodenal diverticulum containing air 3. No further evidence of focal acute abnormalities. No CT evidence accounting for the patient's clinical presentation.   Electronically Signed   By: Margaree Mackintosh M.D.   On: 04/27/2013 10:10    Scheduled Meds: . amLODipine  10 mg Oral Daily  . aspirin EC  325 mg Oral Q breakfast  . atorvastatin  40 mg Oral QHS  .  carvedilol  3.125 mg Oral BID WC  . docusate sodium  100 mg Oral BID  . glimepiride  8 mg Oral Q breakfast  . hydrochlorothiazide  25 mg Oral Daily  . insulin aspart  0-15 Units Subcutaneous TID WC  . levothyroxine  150 mcg Oral QAC breakfast  . loratadine  10 mg Oral Daily  . losartan  100 mg Oral Daily  . [START ON 04/29/2013] metFORMIN  1,000 mg Oral BID WC  . pantoprazole  40 mg Oral Q breakfast   Continuous Infusions: . dextrose 5 % and 0.45 % NaCl with KCl 20 mEq/L 125 mL/hr at 04/26/13 0410   Antibiotics Given (last 72 hours)   Date/Time Action Medication Dose   04/25/13 1030 Given   ceFAZolin (ANCEF) IVPB 2 g/50 mL premix 2 g   04/25/13 1104 Given  [added to bone cement]   cefUROXime (ZINACEF) injection 1.5 g      Active Problems:   Hypothyroidism, postsurgical   Arthritis of right knee   Abdominal pain, unspecified site    Time spent: 25 min    Sylvan Lahm, [place] Pager [phone]. If 7PM-7AM, please contact night-coverage at [email or web address], password [facility] 04/28/2013, 8:03 AM  LOS: 3 days

## 2013-04-28 NOTE — Progress Notes (Signed)
PATIENT ID: Christopher Patel  MRN: [id number]  DOB/AGE:  09-21-1945 / 68 y.o.  3 Days Post-Op Procedure(s) (LRB): TOTAL KNEE ARTHROPLASTY (Right)    PROGRESS NOTE Subjective: Patient is alert, oriented, no Nausea, no Vomiting, yes passing gas, no Bowel Movement. Taking PO well. Denies SOB, Chest or Calf Pain. Using Incentive Spirometer, PAS in place. Ambulate WBAT, CPM 0-60 Patient reports pain as mild and moderate  .    Objective: Vital signs in last 24 hours: Filed Vitals:   04/27/13 1346 04/27/13 2050 04/28/13 0552 04/28/13 0800  BP: 117/69 129/62 144/69   Pulse: 100 100 100   Temp: 98 F (36.7 C) 99.7 F (37.6 C) 99.6 F (37.6 C)   TempSrc: Oral Oral Oral   Resp: 18 18 18 18   Weight:      SpO2: 97% 98% 95%       Intake/Output from previous day: I/O last 3 completed shifts: In: 2600 [P.O.:2600] Out: -    Intake/Output this shift: Total I/O In: 240 [P.O.:240] Out: -    LABORATORY DATA:  Recent Labs  04/26/13 0505  04/26/13 2215 04/27/13 0446  04/27/13 1606 04/27/13 2117 04/28/13 0553  WBC 13.1*  --   --  11.7*  --   --   --   --   HGB 11.3*  --   --  9.9*  --   --   --   --   HCT 33.9*  --   --  29.8*  --   --   --   --   PLT 198  --   --  154  --   --   --   --   NA  --   --  134*  --   --   --   --   --   K  --   --  4.0  --   --   --   --   --   CL  --   --  93*  --   --   --   --   --   CO2  --   --  26  --   --   --   --   --   BUN  --   --  13  --   --   --   --   --   CREATININE  --   --  0.95  --   --   --   --   --   GLUCOSE  --   --  139*  --   --   --   --   --   GLUCAP  --   < >  --   --   < > 140* 114* 115*  INR  --   --  1.12  --   --   --   --   --   CALCIUM  --   --  8.1*  --   --   --   --   --   < > = values in this interval not displayed.  Examination: Neurologically intact ABD soft Neurovascular intact Sensation intact distally Intact pulses distally Dorsiflexion/Plantar flexion intact Incision: dressing C/D/I No cellulitis  present Compartment soft}  Assessment:   3 Days Post-Op Procedure(s) (LRB): TOTAL KNEE ARTHROPLASTY (Right) ADDITIONAL DIAGNOSIS:  Diabetes and Hypertension  Plan: PT/OT WBAT, CPM 5/hrs day until ROM 0-90 degrees, then D/C CPM DVT Prophylaxis:  SCDx72hrs, ASA 325  mg BID x 2 weeks DISCHARGE PLAN: Skilled Nursing Facility/Rehab - [facility] DISCHARGE NEEDS: HHPT, [place], CPM, Walker and 3-in-1 comode seat     PHILLIPS, ERIC R 04/28/2013, 9:38 AM

## 2013-04-28 NOTE — Progress Notes (Signed)
Physical Therapy Treatment Patient Details Name: Christopher Patel MRN: [id number] DOB: [birth year] Today's Date: 04/28/2013    History of Present Illness Patient is a 68 yo male s/p Rt TKA.    PT Comments    Pt in a lot of pain but has good outlook and very willing to participate; improved knee flexion today and increased walking distance; more success with amb and knee flexion with pain meds prior to treatment  Follow Up Recommendations  SNF     Equipment Recommendations  Rolling walker with 5" wheels;3in1 (PT)    Recommendations for Other Services       Precautions / Restrictions Precautions Precautions: Knee Precaution Comments: Reviewed precautions with pt Restrictions Weight Bearing Restrictions: Yes RLE Weight Bearing: Weight bearing as tolerated    Mobility  Bed Mobility                  Transfers Overall transfer level: Needs assistance Equipment used: Rolling walker (2 wheeled) Transfers: Sit to/from Stand Sit to Stand: Min guard         General transfer comment: VC's for hand placement and safe use of RW. Pt required assist to power-up to stand. Cues to control descent into recliner.   Ambulation/Gait Ambulation/Gait assistance: Min guard Ambulation Distance (Feet): 120 Feet Assistive device: Rolling walker (2 wheeled) Gait Pattern/deviations: Step-to pattern;Step-through pattern;Decreased step length - left;Decreased stride length;Antalgic;Trunk flexed (Step-to progressing to step-through towards end of session) Gait velocity: decreased   General Gait Details: VC for posture, step length, and breathing   Stairs            Wheelchair Mobility    Modified Rankin (Stroke Patients Only)       Balance                                    Cognition Arousal/Alertness: Awake/alert Behavior During Therapy: WFL for tasks assessed/performed Overall Cognitive Status: Within Functional Limits for tasks assessed                      Exercises Total Joint Exercises Straight Leg Raises: Supine;AAROM;10 reps;Right Long Arc Quad: Seated;10 reps;AAROM;Right Knee Flexion: AAROM;Right;10 reps;Seated Goniometric ROM: 15-83 degrees    General Comments        Pertinent Vitals/Pain (All in reference to R. Knee pain) 2/10 at rest 6/10 while amb 8/10 with ther ex  Applied ice after session and pt was premedicated for pain.    Home Living                      Prior Function            PT Goals (current goals can now be found in the care plan section) Acute Rehab PT Goals Patient Stated Goal: To walk Progress towards PT goals: Progressing toward goals    Frequency  7X/week    PT Plan Current plan remains appropriate    Co-evaluation             End of Session Equipment Utilized During Treatment: Gait belt Activity Tolerance: Patient tolerated treatment well;Patient limited by pain Patient left: in chair;with call bell/phone within reach     Time: 1151-1227 PT Time Calculation (min): 36 min  Charges:  $Gait Training: 8-22 mins $Therapeutic Exercise: 8-22 mins  G CodesSuella Broad, SPT 04/28/2013, 2:34 PM

## 2013-04-30 ENCOUNTER — Non-Acute Institutional Stay (SKILLED_NURSING_FACILITY): Payer: Commercial Managed Care - HMO | Admitting: Adult Health

## 2013-04-30 DIAGNOSIS — E890 Postprocedural hypothyroidism: Secondary | ICD-10-CM

## 2013-04-30 DIAGNOSIS — Z96659 Presence of unspecified artificial knee joint: Secondary | ICD-10-CM

## 2013-04-30 DIAGNOSIS — M1711 Unilateral primary osteoarthritis, right knee: Secondary | ICD-10-CM

## 2013-04-30 DIAGNOSIS — J309 Allergic rhinitis, unspecified: Secondary | ICD-10-CM

## 2013-04-30 DIAGNOSIS — E785 Hyperlipidemia, unspecified: Secondary | ICD-10-CM

## 2013-04-30 DIAGNOSIS — E042 Nontoxic multinodular goiter: Secondary | ICD-10-CM

## 2013-04-30 DIAGNOSIS — E1159 Type 2 diabetes mellitus with other circulatory complications: Secondary | ICD-10-CM

## 2013-04-30 DIAGNOSIS — Z96651 Presence of right artificial knee joint: Secondary | ICD-10-CM

## 2013-04-30 DIAGNOSIS — M1710 Unilateral primary osteoarthritis, unspecified knee: Secondary | ICD-10-CM

## 2013-04-30 DIAGNOSIS — IMO0002 Reserved for concepts with insufficient information to code with codable children: Secondary | ICD-10-CM

## 2013-04-30 DIAGNOSIS — I10 Essential (primary) hypertension: Secondary | ICD-10-CM

## 2013-05-02 ENCOUNTER — Non-Acute Institutional Stay (SKILLED_NURSING_FACILITY): Payer: Commercial Managed Care - HMO | Admitting: Adult Health

## 2013-05-02 ENCOUNTER — Other Ambulatory Visit: Payer: Self-pay | Admitting: *Deleted

## 2013-05-02 DIAGNOSIS — IMO0002 Reserved for concepts with insufficient information to code with codable children: Secondary | ICD-10-CM

## 2013-05-02 DIAGNOSIS — M1710 Unilateral primary osteoarthritis, unspecified knee: Secondary | ICD-10-CM

## 2013-05-02 DIAGNOSIS — M1711 Unilateral primary osteoarthritis, right knee: Secondary | ICD-10-CM

## 2013-05-02 DIAGNOSIS — Z96651 Presence of right artificial knee joint: Secondary | ICD-10-CM

## 2013-05-02 DIAGNOSIS — Z96659 Presence of unspecified artificial knee joint: Secondary | ICD-10-CM

## 2013-05-02 MED ORDER — OXYCODONE-ACETAMINOPHEN 10-325 MG PO TABS: ORAL_TABLET | ORAL | Status: DC

## 2013-05-02 NOTE — Telephone Encounter (Signed)
Rx faxed top [place] @ [phone]

## 2013-05-04 ENCOUNTER — Encounter: Payer: Self-pay | Admitting: Adult Health

## 2013-05-04 DIAGNOSIS — J309 Allergic rhinitis, unspecified: Secondary | ICD-10-CM | POA: Insufficient documentation

## 2013-05-04 DIAGNOSIS — E119 Type 2 diabetes mellitus without complications: Secondary | ICD-10-CM | POA: Insufficient documentation

## 2013-05-04 DIAGNOSIS — Z96651 Presence of right artificial knee joint: Secondary | ICD-10-CM | POA: Insufficient documentation

## 2013-05-04 DIAGNOSIS — E785 Hyperlipidemia, unspecified: Secondary | ICD-10-CM | POA: Insufficient documentation

## 2013-05-04 DIAGNOSIS — I10 Essential (primary) hypertension: Secondary | ICD-10-CM | POA: Insufficient documentation

## 2013-05-04 NOTE — Progress Notes (Signed)
Patient ID: Christopher Patel, male   DOB: [birth year], 68 y.o.   MRN: [id number]     [facility]  Allergies  Allergen Reactions  . Codeine Itching  . Actos [Pioglitazone Hydrochloride] Nausea Only  . Glyburide     hypoglycemia  . Metformin And Related     Hypoglycemia, shakes     Chief Complaint  Patient presents with  . Hospitalization Follow-up    HPI:  Christopher Patel has been hospitalized for a right knee replacement. Christopher Patel goal here is for short term rehab and to return back home. Christopher Patel is having pain with therapy which is not being adequately treated at this time. There are no concerns being voiced by the nursing staff at this time.   Past Medical History  Diagnosis Date  . Hyperlipidemia   . CAD (coronary artery disease)   . Depression   . ED (erectile dysfunction)   . Lung nodule   . Hepatic lesion     left lobe  . Tubular adenoma     polyps- Dr.Edwards  . Pneumonia   . Nasal congestion   . Change in voice   . Cough   . Thyroid disease     thyroid lesion  . Diabetes mellitus 04-25-11    oral meds  . Osteoarthritis 04-25-11    spine area-some neck issues  . Hypothyroidism   . PONV (postoperative nausea and vomiting)   . Hypertension     dr Marilynn Rail    Past Surgical History  Procedure Laterality Date  . Knee surgery  04-25-11    right/ left knee scope  . Eye surgery  11/2010    cataract x2  . Eye surgery  12/2010    len implant x2   . Thyroidectomy  05/02/2011    Procedure: THYROIDECTOMY;  Surgeon: Earnstine Regal, MD;  Location: [facility];  Service: General;  Laterality: N/A;  Total Thyroidectomy  . Cardiac catheterization  04-25-11    many yrs ago-very slight blockage 1 vessel (60% RCA by Dr. Dorthy Cooler notes)  . Total knee arthroplasty Right 04/25/2013    Procedure: TOTAL KNEE ARTHROPLASTY;  Surgeon: Kerin Salen, MD;  Location: [place];  Service: Orthopedics;  Laterality: Right;    VITAL SIGNS BP 132/74  Pulse 96  Ht 5\' 9"  (1.753 m)  Wt 204 lb (92.534 kg)  BMI 30.11  kg/m2   Patient's Medications  New Prescriptions   No medications on file  Previous Medications   AMLODIPINE (NORVASC) 10 MG TABLET    Take 10 mg by mouth daily.   ASPIRIN EC 325 MG TABLET    Take 1 tablet (325 mg total) by mouth 2 (two) times daily.   ATORVASTATIN (LIPITOR) 80 MG TABLET    Take 40 mg by mouth at bedtime.   CARVEDILOL (COREG) 3.125 MG TABLET    Take 3.125 mg by mouth 2 (two) times daily with a meal.   FEXOFENADINE (ALLEGRA) 180 MG TABLET    Take 180 mg by mouth daily as needed. Allergies    FLUTICASONE (VERAMYST) 27.5 MCG/SPRAY NASAL SPRAY    Place 2 sprays into the nose daily as needed for allergies.    GLIMEPIRIDE (AMARYL) 4 MG TABLET    Take 8 mg by mouth daily with breakfast.   HYDROCHLOROTHIAZIDE (HYDRODIURIL) 25 MG TABLET    Take 25 mg by mouth daily.   LEVOTHYROXINE (SYNTHROID, LEVOTHROID) 150 MCG TABLET    Take 150 mcg by mouth daily before breakfast.  LOSARTAN (COZAAR) 100 MG TABLET    Take 100 mg by mouth daily.    METFORMIN (GLUCOPHAGE-XR) 500 MG 24 HR TABLET    Take 1,000 mg by mouth 2 (two) times daily.   METHOCARBAMOL (ROBAXIN) 500 MG TABLET    Take 1 tablet (500 mg total) by mouth 2 (two) times daily with a meal.   OXYCODONE-ACETAMINOPHEN (ROXICET) 5-325 MG PER TABLET    Take 1 tablet by mouth every 4 (four) hours as needed.   PANTOPRAZOLE (PROTONIX) 40 MG TABLET    Take 40 mg by mouth daily.  Modified Medications   No medications on file  Discontinued Medications   No medications on file    SIGNIFICANT DIAGNOSTIC EXAMS  04-27-13: ct of abdomen and pelvis: 1. Left adrenal nodule differential considerations include an adrenal adenoma. Further characterization with adrenal protocol MRI recommended. 2. Duodenal diverticulum containing air 3. No further evidence of focal acute abnormalities. No CT evidence accounting for the patient's clinical presentation.     LABS REVIEWED  04-15-13: wbc 10.3; hgb 15.5; hct 44.6; mcv 90.5 plt 242; glucose 222; bun 15;  creat 0.82; k+4.1; na++138;  04-25-13: hgb a1c 7.2;  04-26-13: glucose 139; bun 13; creat 0.95; k+4.0; na++134; liver normal albumin 3.3 04-28-13: wbc 9.0; hgb 9.2; hct 27.3; mcv 91.3 plt 166      Review of Systems  Constitutional: Negative for malaise/fatigue.  Respiratory: Negative for cough and shortness of breath.   Cardiovascular: Negative for chest pain, palpitations and leg swelling.  Gastrointestinal: Negative for heartburn, abdominal pain and constipation.  Musculoskeletal: Positive for joint pain. Negative for myalgias.  Skin: Negative.   Psychiatric/Behavioral: Negative for depression. The patient is not nervous/anxious.      Physical Exam  Constitutional: Christopher Patel is oriented to person, place, and time. Christopher Patel appears well-developed and well-nourished. No distress.  Neck: Neck supple. No JVD present.  Cardiovascular: Normal rate, regular rhythm and intact distal pulses.   Respiratory: Effort normal and breath sounds normal. No respiratory distress. Christopher Patel has no wheezes.  GI: Soft. Bowel sounds are normal. Christopher Patel exhibits no distension. There is no tenderness.  Musculoskeletal: Christopher Patel exhibits no edema.  Is able to move all extremities. Is status post right knee replacement.   Neurological: Christopher Patel is alert and oriented to person, place, and time.  Skin: Skin is warm and dry. Christopher Patel is not diaphoretic.  Incision lin without signs of infection present   Psychiatric: Christopher Patel has a normal mood and affect.       ASSESSMENT/ PLAN:  1. Osteoarthritis right knee: is status post right knee replacement; will continue therapy as directed. Will give him an extra percocet one time now and will change Christopher Patel percocet to 1 or 2 tabs every 4 hours as needed will continue the robaxin 500 mg twice daily and will monitor Christopher Patel status.   2. Hypertension: will continue norvasc 10 mg daily; coreg 3.125 mg twice daily; hctz 25 mg daily; cozaar 100 mg daily and will monitor   3. Diabetes: is stable will continue metformin xr 1gm  twice daily and amaryl 8 mg daily and iwll monitor   4. Hypothyroidism: will continye synthroid 150 mcg daily   5. Dyslipidemia: will continue lipitor 40 mg daily   6. Allergic rhinitis: allegra 180 mg daily as needed; and veramyst daily as needed    Time spent with patient 50 minutes.       Ok Edwards NP [facility]  Contact [phone] Monday through Friday 8am- 5pm  After  hours call [phone]

## 2013-05-05 ENCOUNTER — Non-Acute Institutional Stay (SKILLED_NURSING_FACILITY): Payer: Commercial Managed Care - HMO | Admitting: Internal Medicine

## 2013-05-05 DIAGNOSIS — E890 Postprocedural hypothyroidism: Secondary | ICD-10-CM

## 2013-05-05 DIAGNOSIS — E785 Hyperlipidemia, unspecified: Secondary | ICD-10-CM

## 2013-05-05 DIAGNOSIS — IMO0002 Reserved for concepts with insufficient information to code with codable children: Secondary | ICD-10-CM

## 2013-05-05 DIAGNOSIS — M1710 Unilateral primary osteoarthritis, unspecified knee: Secondary | ICD-10-CM

## 2013-05-05 DIAGNOSIS — Z96651 Presence of right artificial knee joint: Secondary | ICD-10-CM

## 2013-05-05 DIAGNOSIS — Z96659 Presence of unspecified artificial knee joint: Secondary | ICD-10-CM

## 2013-05-05 DIAGNOSIS — M1711 Unilateral primary osteoarthritis, right knee: Secondary | ICD-10-CM

## 2013-05-05 DIAGNOSIS — E1159 Type 2 diabetes mellitus with other circulatory complications: Secondary | ICD-10-CM

## 2013-05-05 DIAGNOSIS — I10 Essential (primary) hypertension: Secondary | ICD-10-CM

## 2013-05-05 MED ORDER — METHOCARBAMOL 500 MG PO TABS: 500.0000 mg | ORAL_TABLET | Freq: Four times a day (QID) | ORAL | Status: DC

## 2013-05-05 NOTE — Progress Notes (Signed)
Patient ID: Christopher Patel, male   DOB: 15-Apr-1945, 68 y.o.   MRN: [id number]     [facility]  Allergies  Allergen Reactions  . Codeine Itching  . Actos [Pioglitazone Hydrochloride] Nausea Only  . Glyburide     hypoglycemia  . Metformin And Related     Hypoglycemia, shakes     Chief Complaint  Patient presents with  . Acute Visit    pain management     HPI:  He has been taking 2 percocet 5/325 tablets every 4 hours routinely for his pain management. The nursing staff is concerned about the volume of tylenol he is receiving. He is also not getting adequate pain relief at this time. We did discuss taking percocet 10/325 mg every 4 hours routinely to help cut down on the tylenol use and to make the robaxin every 6 hours.   Past Medical History  Diagnosis Date  . Hyperlipidemia   . CAD (coronary artery disease)   . Depression   . ED (erectile dysfunction)   . Lung nodule   . Hepatic lesion     left lobe  . Tubular adenoma     polyps- Dr.Edwards  . Pneumonia   . Nasal congestion   . Change in voice   . Cough   . Thyroid disease     thyroid lesion  . Diabetes mellitus 04-25-11    oral meds  . Osteoarthritis 04-25-11    spine area-some neck issues  . Hypothyroidism   . PONV (postoperative nausea and vomiting)   . Hypertension     dr Marilynn Rail    Past Surgical History  Procedure Laterality Date  . Knee surgery  04-25-11    right/ left knee scope  . Eye surgery  11/2010    cataract x2  . Eye surgery  12/2010    len implant x2   . Thyroidectomy  05/02/2011    Procedure: THYROIDECTOMY;  Surgeon: Earnstine Regal, MD;  Location: [facility];  Service: General;  Laterality: N/A;  Total Thyroidectomy  . Cardiac catheterization  04-25-11    many yrs ago-very slight blockage 1 vessel (60% RCA by Dr. Dorthy Cooler notes)  . Total knee arthroplasty Right 04/25/2013    Procedure: TOTAL KNEE ARTHROPLASTY;  Surgeon: Kerin Salen, MD;  Location: [place];  Service: Orthopedics;  Laterality:  Right;    VITAL SIGNS BP 138/78  Pulse 78  Ht 5\' 8"  (1.727 m)  Wt 204 lb (92.534 kg)  BMI 31.03 kg/m2   Patient's Medications  New Prescriptions   No medications on file  Previous Medications   AMLODIPINE (NORVASC) 10 MG TABLET    Take 10 mg by mouth daily.   ASPIRIN EC 325 MG TABLET    Take 1 tablet (325 mg total) by mouth 2 (two) times daily.   ATORVASTATIN (LIPITOR) 80 MG TABLET    Take 40 mg by mouth at bedtime.   CARVEDILOL (COREG) 3.125 MG TABLET    Take 3.125 mg by mouth 2 (two) times daily with a meal.   FEXOFENADINE (ALLEGRA) 180 MG TABLET    Take 180 mg by mouth daily as needed. Allergies    FLUTICASONE (VERAMYST) 27.5 MCG/SPRAY NASAL SPRAY    Place 2 sprays into the nose daily as needed for allergies.    GLIMEPIRIDE (AMARYL) 4 MG TABLET    Take 8 mg by mouth daily with breakfast.   HYDROCHLOROTHIAZIDE (HYDRODIURIL) 25 MG TABLET    Take 25 mg by  mouth daily.   LEVOTHYROXINE (SYNTHROID, LEVOTHROID) 150 MCG TABLET    Take 150 mcg by mouth daily before breakfast.   LOSARTAN (COZAAR) 100 MG TABLET    Take 100 mg by mouth daily.    METFORMIN (GLUCOPHAGE-XR) 500 MG 24 HR TABLET    Take 1,000 mg by mouth 2 (two) times daily.   METHOCARBAMOL (ROBAXIN) 500 MG TABLET    Take 1 tablet (500 mg total) by mouth 2 (two) times daily with a meal.   OXYCODONE-ACETAMINOPHEN (ROXICET) 5-325 MG PER TABLET    Take 1- 2  tablet by mouth every 4 (four) hours as needed.   PANTOPRAZOLE (PROTONIX) 40 MG TABLET    Take 40 mg by mouth daily.  Modified Medications   No medications on file  Discontinued Medications   No medications on file    SIGNIFICANT DIAGNOSTIC EXAMS   04-27-13: ct of abdomen and pelvis: 1. Left adrenal nodule differential considerations include an adrenal adenoma. Further characterization with adrenal protocol MRI recommended. 2. Duodenal diverticulum containing air 3. No further evidence of focal acute abnormalities. No CT evidence accounting for the patient's clinical  presentation.     LABS REVIEWED  04-15-13: wbc 10.3; hgb 15.5; hct 44.6; mcv 90.5 plt 242; glucose 222; bun 15; creat 0.82; k+4.1; na++138;  04-25-13: hgb a1c 7.2;  04-26-13: glucose 139; bun 13; creat 0.95; k+4.0; na++134; liver normal albumin 3.3 04-28-13: wbc 9.0; hgb 9.2; hct 27.3; mcv 91.3 plt 166      Review of Systems  Constitutional: Negative for malaise/fatigue.  Respiratory: Negative for cough and shortness of breath.   Cardiovascular: Negative for chest pain, palpitations and leg swelling.  Gastrointestinal: Negative for heartburn, abdominal pain and constipation.  Musculoskeletal: Positive for joint pain. Negative for myalgias.  Skin: Negative.   Psychiatric/Behavioral: Negative for depression. The patient is not nervous/anxious.      Physical Exam  Constitutional: He is oriented to person, place, and time. He appears well-developed and well-nourished. No distress.  Neck: Neck supple. No JVD present.  Cardiovascular: Normal rate, regular rhythm and intact distal pulses.   Respiratory: Effort normal and breath sounds normal. No respiratory distress. He has no wheezes.  GI: Soft. Bowel sounds are normal. He exhibits no distension. There is no tenderness.  Musculoskeletal: He exhibits no edema.  Is able to move all extremities. Is status post right knee replacement.   Neurological: He is alert and oriented to person, place, and time.  Skin: Skin is warm and dry. He is not diaphoretic.  Incision line without signs of infection present   Psychiatric: He has a normal mood and affect.     ASSESSMENT/ PLAN:  1. Osteoarthritis status post right knee replacement; will change his robaxin to 500 mg every 6 hours and will change the percocet 10/325 mg every 4 hours routinely will continue to monitor his status and pain relief.

## 2013-05-08 ENCOUNTER — Non-Acute Institutional Stay (SKILLED_NURSING_FACILITY): Payer: Medicare HMO | Admitting: Adult Health

## 2013-05-08 DIAGNOSIS — M1710 Unilateral primary osteoarthritis, unspecified knee: Secondary | ICD-10-CM

## 2013-05-08 DIAGNOSIS — IMO0002 Reserved for concepts with insufficient information to code with codable children: Secondary | ICD-10-CM

## 2013-05-08 DIAGNOSIS — Z96651 Presence of right artificial knee joint: Secondary | ICD-10-CM

## 2013-05-08 DIAGNOSIS — Z96659 Presence of unspecified artificial knee joint: Secondary | ICD-10-CM

## 2013-05-08 DIAGNOSIS — M1711 Unilateral primary osteoarthritis, right knee: Secondary | ICD-10-CM

## 2013-05-08 DIAGNOSIS — E1159 Type 2 diabetes mellitus with other circulatory complications: Secondary | ICD-10-CM

## 2013-05-08 DIAGNOSIS — I10 Essential (primary) hypertension: Secondary | ICD-10-CM

## 2013-05-18 ENCOUNTER — Encounter: Payer: Self-pay | Admitting: Adult Health

## 2013-05-18 NOTE — Progress Notes (Signed)
Patient ID: Christopher Patel, male   DOB: 1945-07-06, 68 y.o.   MRN: [id number]    [facility]  Allergies  Allergen Reactions  . Codeine Itching  . Actos [Pioglitazone Hydrochloride] Nausea Only  . Glyburide     hypoglycemia  . Metformin And Related     Hypoglycemia, shakes     Chief Complaint  Patient presents with  . Discharge Note    HPI:  He is being discharged to home with home health for pt/ot/nursing. He will not need dme. He will need prescriptions to be written.  He was admitted to this facility for short term rehab for a right knee replacement. He is now ready for discharge to home.   Past Medical History  Diagnosis Date  . Hyperlipidemia   . CAD (coronary artery disease)   . Depression   . ED (erectile dysfunction)   . Lung nodule   . Hepatic lesion     left lobe  . Tubular adenoma     polyps- Dr.Edwards  . Pneumonia   . Nasal congestion   . Change in voice   . Cough   . Thyroid disease     thyroid lesion  . Diabetes mellitus 04-25-11    oral meds  . Osteoarthritis 04-25-11    spine area-some neck issues  . Hypothyroidism   . PONV (postoperative nausea and vomiting)   . Hypertension     dr Marilynn Rail    Past Surgical History  Procedure Laterality Date  . Knee surgery  04-25-11    right/ left knee scope  . Eye surgery  11/2010    cataract x2  . Eye surgery  12/2010    len implant x2   . Thyroidectomy  05/02/2011    Procedure: THYROIDECTOMY;  Surgeon: Earnstine Regal, MD;  Location: [facility];  Service: General;  Laterality: N/A;  Total Thyroidectomy  . Cardiac catheterization  04-25-11    many yrs ago-very slight blockage 1 vessel (60% RCA by Dr. Dorthy Cooler notes)  . Total knee arthroplasty Right 04/25/2013    Procedure: TOTAL KNEE ARTHROPLASTY;  Surgeon: Kerin Salen, MD;  Location: [place];  Service: Orthopedics;  Laterality: Right;    VITAL SIGNS BP 137/74  Pulse 76  Ht 5\' 9"  (1.753 m)  Wt 206 lb 9.6 oz (93.713 kg)  BMI 30.50 kg/m2   Patient's  Medications  New Prescriptions   No medications on file  Previous Medications   AMLODIPINE (NORVASC) 10 MG TABLET    Take 10 mg by mouth daily.   ASPIRIN EC 325 MG TABLET    Take 1 tablet (325 mg total) by mouth 2 (two) times daily.   ATORVASTATIN (LIPITOR) 80 MG TABLET    Take 40 mg by mouth at bedtime.   CARVEDILOL (COREG) 3.125 MG TABLET    Take 3.125 mg by mouth 2 (two) times daily with a meal.   FEXOFENADINE (ALLEGRA) 180 MG TABLET    Take 180 mg by mouth daily as needed. Allergies    FLUTICASONE (VERAMYST) 27.5 MCG/SPRAY NASAL SPRAY    Place 2 sprays into the nose daily as needed for allergies.    GLIMEPIRIDE (AMARYL) 4 MG TABLET    Take 8 mg by mouth daily with breakfast.   HYDROCHLOROTHIAZIDE (HYDRODIURIL) 25 MG TABLET    Take 25 mg by mouth daily.   LEVOTHYROXINE (SYNTHROID, LEVOTHROID) 150 MCG TABLET    Take 150 mcg by mouth daily before breakfast.   LOSARTAN (COZAAR)  100 MG TABLET    Take 100 mg by mouth daily.    METFORMIN (GLUCOPHAGE-XR) 500 MG 24 HR TABLET    Take 1,000 mg by mouth 2 (two) times daily.   METHOCARBAMOL (ROBAXIN) 500 MG TABLET    Take 1 tablet (500 mg total) by mouth 4 (four) times daily.   OXYCODONE-ACETAMINOPHEN (PERCOCET) 10-325 MG PER TABLET    Take 1 tablet by mouth every 4 hours routinely for pain **DON NOT EXCEED 4 GM OF TYLENOL IN 24 HOURS**   PANTOPRAZOLE (PROTONIX) 40 MG TABLET    Take 40 mg by mouth daily.  Modified Medications   No medications on file  Discontinued Medications   No medications on file    SIGNIFICANT DIAGNOSTIC EXAMS   04-27-13: ct of abdomen and pelvis: 1. Left adrenal nodule differential considerations include an adrenal adenoma. Further characterization with adrenal protocol MRI recommended. 2. Duodenal diverticulum containing air 3. No further evidence of focal acute abnormalities. No CT evidence accounting for the patient's clinical presentation.     LABS REVIEWED  04-15-13: wbc 10.3; hgb 15.5; hct 44.6; mcv 90.5 plt 242;  glucose 222; bun 15; creat 0.82; k+4.1; na++138;  04-25-13: hgb a1c 7.2;  04-26-13: glucose 139; bun 13; creat 0.95; k+4.0; na++134; liver normal albumin 3.3 04-28-13: wbc 9.0; hgb 9.2; hct 27.3; mcv 91.3 plt 166      Review of Systems  Constitutional: Negative for malaise/fatigue.  Respiratory: Negative for cough and shortness of breath.   Cardiovascular: Negative for chest pain, palpitations and leg swelling.  Gastrointestinal: Negative for heartburn, abdominal pain and constipation.  Musculoskeletal: negative for joint pain. Negative for myalgias.  Skin: Negative.   Psychiatric/Behavioral: Negative for depression. The patient is not nervous/anxious.      Physical Exam  Constitutional: He is oriented to person, place, and time. He appears well-developed and well-nourished. No distress.  Neck: Neck supple. No JVD present.  Cardiovascular: Normal rate, regular rhythm and intact distal pulses.   Respiratory: Effort normal and breath sounds normal. No respiratory distress. He has no wheezes.  GI: Soft. Bowel sounds are normal. He exhibits no distension. There is no tenderness.  Musculoskeletal: He exhibits no edema.  Is able to move all extremities. Is status post right knee replacement.   Neurological: He is alert and oriented to person, place, and time.  Skin: Skin is warm and dry. He is not diaphoretic.   Psychiatric: He has a normal mood and affect.      ASSESSMENT/ PLAN:  Will discharge to home with home health for pt/ot/nursing. He will not need any dme. His prescriptions have been written.   Time spent with patient 40 minutes.      Ok Edwards NP [facility]  Contact [phone] Monday through Friday 8am- 5pm  After hours call [phone]

## 2013-07-28 NOTE — Progress Notes (Signed)
Patient ID: Christopher Patel, male   DOB: 1945-07-31, 68 y.o.   MRN: [id number]    Facility: [address] [facility]   Chief Complaint  Patient presents with  . Hospitalization Follow-up    new admit   Allergies  Allergen Reactions  . Codeine Itching  . Actos [Pioglitazone Hydrochloride] Nausea Only  . Glyburide     hypoglycemia  . Metformin And Related     Hypoglycemia, shakes   HPI 68 y/o male patient is here for STR after hospital admission with right knee OA. He underwent right knee arthroplasty. He has been working with therapy team. Pain is under control. Denies any complaints today.  Review of Systems  Constitutional: Negative for fever, chills, malaise/fatigue and diaphoresis.  HENT: Negative for congestion, hearing loss and sore throat.   Eyes: Negative for blurred vision Respiratory: Negative for cough, sputum production, shortness of breath and wheezing.   Cardiovascular: Negative for chest pain, palpitations, orthopnea and leg swelling.  Gastrointestinal: Negative for heartburn, nausea, vomiting, abdominal pain  Genitourinary: Negative for dysuria, urgency, frequency and flank pain.  Musculoskeletal: Negative for back pain Skin: Negative for itching and rash.  Neurological: Positive for weakness. Negative for dizziness, tingling, focal weakness and headaches.  Psychiatric/Behavioral: Negative for depression and memory loss. The patient is not nervous/anxious.    Past Medical History  Diagnosis Date  . Hyperlipidemia   . CAD (coronary artery disease)   . Depression   . ED (erectile dysfunction)   . Lung nodule   . Hepatic lesion     left lobe  . Tubular adenoma     polyps- Dr.Edwards  . Pneumonia   . Nasal congestion   . Change in voice   . Cough   . Thyroid disease     thyroid lesion  . Diabetes mellitus 04-25-11    oral meds  . Osteoarthritis 04-25-11    spine area-some neck issues  . Hypothyroidism   . PONV (postoperative nausea and  vomiting)   . Hypertension     dr Marilynn Rail   Medication reviewed. See [facility] Past Surgical History  Procedure Laterality Date  . Knee surgery  04-25-11    right/ left knee scope  . Eye surgery  11/2010    cataract x2  . Eye surgery  12/2010    len implant x2   . Thyroidectomy  05/02/2011    Procedure: THYROIDECTOMY;  Surgeon: Earnstine Regal, MD;  Location: [facility];  Service: General;  Laterality: N/A;  Total Thyroidectomy  . Cardiac catheterization  04-25-11    many yrs ago-very slight blockage 1 vessel (60% RCA by Dr. Dorthy Cooler notes)  . Total knee arthroplasty Right 04/25/2013    Procedure: TOTAL KNEE ARTHROPLASTY;  Surgeon: Kerin Salen, MD;  Location: [place];  Service: Orthopedics;  Laterality: Right;   History   Social History  . Marital Status: Widowed    Spouse Name: N/A    Number of Children: N/A  . Years of Education: N/A   Occupational History  . Not on file.   Social History Main Topics  . Smoking status: Former Smoker -- 16 years    Quit date: 11/24/2009  . Smokeless tobacco: Never Used  . Alcohol Use: Yes     Comment: Beer on weekends, 6 or more  . Drug Use: No  . Sexual Activity: No   Other Topics Concern  . Not on file   Social History Narrative  . No narrative on  file   Family History  Problem Relation Age of Onset  . COPD Other   . Hypertension Other   . Hyperlipidemia Other   . Heart attack Other   . Cancer Brother     colon   Physical exam BP 138/76  Pulse 75  Temp(Src) 98 F (36.7 C)  Resp 18  SpO2 95%  Constitutional: well-developed and well-nourished. No distress.  Neck: Neck supple. No JVD present.  Cardiovascular: Normal rate, regular rhythm and intact distal pulses.   Respiratory: Effort normal and breath sounds normal. No respiratory distress. He has no wheezes.  GI: Soft. Bowel sounds are normal. He exhibits no distension. There is no tenderness.  Musculoskeletal: He exhibits no edema. Right knee ROM limited. Able to move all  extremities Neurological: He is alert and oriented to person, place, and time.  Skin: Skin is warm and dry. He is not diaphoretic. Incision site healing well Psychiatric: He has a normal mood and affect.   Labs- 04-15-13: wbc 10.3; hgb 15.5; hct 44.6; mcv 90.5 plt 242; glucose 222; bun 15; creat 0.82; k+4.1; na++138;   04-25-13: hgb a1c 7.2;   04-26-13: glucose 139; bun 13; creat 0.95; k+4.0; na++134; liver normal albumin 3.3 04-28-13: wbc 9.0; hgb 9.2; hct 27.3; mcv 91.3 plt 166   Assessment/plan  Osteoarthritis S/p right knee arthroplasty. Continue percocet current regimen with robaxin bid and reassess. Will have patient work with PT/OT as tolerated to regain strength and restore function.  Fall precautions are in place. Continue aspirin 325 mg daily   Hyperlipidemia Continue lipitor 40 mg daily  Hypertension Stable. continue norvasc 10 mg daily with coreg 3.125 mg twice daily, hctz 25 mg daily and cozaar 100 mg daily   Diabetes continue metformin 1gm twice daily and amaryl 8 mg daily. Monitor cbg  Hypothyroidism Stable on synthroid 150 mcg daily

## 2014-03-06 DIAGNOSIS — E785 Hyperlipidemia, unspecified: Secondary | ICD-10-CM | POA: Diagnosis not present

## 2014-03-06 DIAGNOSIS — E1165 Type 2 diabetes mellitus with hyperglycemia: Secondary | ICD-10-CM | POA: Diagnosis not present

## 2014-03-06 DIAGNOSIS — E039 Hypothyroidism, unspecified: Secondary | ICD-10-CM | POA: Diagnosis not present

## 2014-03-06 DIAGNOSIS — I10 Essential (primary) hypertension: Secondary | ICD-10-CM | POA: Diagnosis not present

## 2014-03-06 DIAGNOSIS — G4700 Insomnia, unspecified: Secondary | ICD-10-CM | POA: Diagnosis not present

## 2014-06-09 DIAGNOSIS — I2510 Atherosclerotic heart disease of native coronary artery without angina pectoris: Secondary | ICD-10-CM | POA: Diagnosis not present

## 2014-06-09 DIAGNOSIS — E785 Hyperlipidemia, unspecified: Secondary | ICD-10-CM | POA: Diagnosis not present

## 2014-06-09 DIAGNOSIS — E278 Other specified disorders of adrenal gland: Secondary | ICD-10-CM | POA: Diagnosis not present

## 2014-06-09 DIAGNOSIS — E039 Hypothyroidism, unspecified: Secondary | ICD-10-CM | POA: Diagnosis not present

## 2014-06-09 DIAGNOSIS — E1165 Type 2 diabetes mellitus with hyperglycemia: Secondary | ICD-10-CM | POA: Diagnosis not present

## 2014-06-09 DIAGNOSIS — I10 Essential (primary) hypertension: Secondary | ICD-10-CM | POA: Diagnosis not present

## 2014-06-15 ENCOUNTER — Other Ambulatory Visit: Payer: Self-pay | Admitting: Family Medicine

## 2014-06-15 DIAGNOSIS — E279 Disorder of adrenal gland, unspecified: Principal | ICD-10-CM

## 2014-06-15 DIAGNOSIS — E278 Other specified disorders of adrenal gland: Secondary | ICD-10-CM

## 2014-06-17 ENCOUNTER — Ambulatory Visit
Admission: RE | Admit: 2014-06-17 | Discharge: 2014-06-17 | Disposition: A | Discharge status: Home / Self Care | Payer: Commercial Managed Care - HMO | Source: Ambulatory Visit | Attending: Family Medicine | Admitting: Family Medicine

## 2014-06-17 DIAGNOSIS — E278 Other specified disorders of adrenal gland: Secondary | ICD-10-CM

## 2014-06-17 DIAGNOSIS — K760 Fatty (change of) liver, not elsewhere classified: Secondary | ICD-10-CM | POA: Diagnosis not present

## 2014-06-17 DIAGNOSIS — E279 Disorder of adrenal gland, unspecified: Principal | ICD-10-CM

## 2014-06-17 DIAGNOSIS — D3502 Benign neoplasm of left adrenal gland: Secondary | ICD-10-CM | POA: Diagnosis not present

## 2014-06-17 DIAGNOSIS — D1803 Hemangioma of intra-abdominal structures: Secondary | ICD-10-CM | POA: Diagnosis not present

## 2014-06-17 MED ORDER — GADOBENATE DIMEGLUMINE 529 MG/ML IV SOLN
20.0000 mL | Freq: Once | INTRAVENOUS | Status: AC | PRN
Start: 2014-06-17 — End: 2014-06-17
  Administered 2014-06-17: 20 mL via INTRAVENOUS

## 2014-07-22 DIAGNOSIS — E1165 Type 2 diabetes mellitus with hyperglycemia: Secondary | ICD-10-CM | POA: Diagnosis not present

## 2014-07-22 DIAGNOSIS — I10 Essential (primary) hypertension: Secondary | ICD-10-CM | POA: Diagnosis not present

## 2014-09-14 DIAGNOSIS — Z125 Encounter for screening for malignant neoplasm of prostate: Secondary | ICD-10-CM | POA: Diagnosis not present

## 2014-09-14 DIAGNOSIS — I2510 Atherosclerotic heart disease of native coronary artery without angina pectoris: Secondary | ICD-10-CM | POA: Diagnosis not present

## 2014-09-14 DIAGNOSIS — Z0000 Encounter for general adult medical examination without abnormal findings: Secondary | ICD-10-CM | POA: Diagnosis not present

## 2014-09-14 DIAGNOSIS — E1165 Type 2 diabetes mellitus with hyperglycemia: Secondary | ICD-10-CM | POA: Diagnosis not present

## 2014-09-14 DIAGNOSIS — Z79899 Other long term (current) drug therapy: Secondary | ICD-10-CM | POA: Diagnosis not present

## 2014-09-14 DIAGNOSIS — E039 Hypothyroidism, unspecified: Secondary | ICD-10-CM | POA: Diagnosis not present

## 2014-09-14 DIAGNOSIS — I10 Essential (primary) hypertension: Secondary | ICD-10-CM | POA: Diagnosis not present

## 2014-09-14 DIAGNOSIS — E785 Hyperlipidemia, unspecified: Secondary | ICD-10-CM | POA: Diagnosis not present

## 2014-12-13 ENCOUNTER — Emergency Department (HOSPITAL_COMMUNITY)
Admission: EM | Admit: 2014-12-13 | Discharge: 2014-12-13 | Disposition: A | Discharge status: Home / Self Care | Payer: Commercial Managed Care - HMO | Attending: Emergency Medicine | Admitting: Emergency Medicine

## 2014-12-13 ENCOUNTER — Encounter (HOSPITAL_COMMUNITY): Payer: Self-pay | Admitting: Emergency Medicine

## 2014-12-13 DIAGNOSIS — Z87438 Personal history of other diseases of male genital organs: Secondary | ICD-10-CM | POA: Insufficient documentation

## 2014-12-13 DIAGNOSIS — Z7982 Long term (current) use of aspirin: Secondary | ICD-10-CM | POA: Diagnosis not present

## 2014-12-13 DIAGNOSIS — Z8659 Personal history of other mental and behavioral disorders: Secondary | ICD-10-CM | POA: Insufficient documentation

## 2014-12-13 DIAGNOSIS — Z7984 Long term (current) use of oral hypoglycemic drugs: Secondary | ICD-10-CM | POA: Insufficient documentation

## 2014-12-13 DIAGNOSIS — Z87891 Personal history of nicotine dependence: Secondary | ICD-10-CM | POA: Diagnosis not present

## 2014-12-13 DIAGNOSIS — I2510 Atherosclerotic heart disease of native coronary artery without angina pectoris: Secondary | ICD-10-CM | POA: Diagnosis not present

## 2014-12-13 DIAGNOSIS — I10 Essential (primary) hypertension: Secondary | ICD-10-CM | POA: Insufficient documentation

## 2014-12-13 DIAGNOSIS — E785 Hyperlipidemia, unspecified: Secondary | ICD-10-CM | POA: Insufficient documentation

## 2014-12-13 DIAGNOSIS — Z8701 Personal history of pneumonia (recurrent): Secondary | ICD-10-CM | POA: Diagnosis not present

## 2014-12-13 DIAGNOSIS — E039 Hypothyroidism, unspecified: Secondary | ICD-10-CM | POA: Insufficient documentation

## 2014-12-13 DIAGNOSIS — M1990 Unspecified osteoarthritis, unspecified site: Secondary | ICD-10-CM | POA: Diagnosis not present

## 2014-12-13 DIAGNOSIS — Z86018 Personal history of other benign neoplasm: Secondary | ICD-10-CM | POA: Insufficient documentation

## 2014-12-13 DIAGNOSIS — M545 Low back pain: Secondary | ICD-10-CM | POA: Diagnosis present

## 2014-12-13 DIAGNOSIS — M5432 Sciatica, left side: Secondary | ICD-10-CM | POA: Diagnosis not present

## 2014-12-13 DIAGNOSIS — E119 Type 2 diabetes mellitus without complications: Secondary | ICD-10-CM | POA: Diagnosis not present

## 2014-12-13 DIAGNOSIS — Z79899 Other long term (current) drug therapy: Secondary | ICD-10-CM | POA: Insufficient documentation

## 2014-12-13 DIAGNOSIS — Z8719 Personal history of other diseases of the digestive system: Secondary | ICD-10-CM | POA: Diagnosis not present

## 2014-12-13 DIAGNOSIS — M5418 Radiculopathy, sacral and sacrococcygeal region: Secondary | ICD-10-CM | POA: Diagnosis not present

## 2014-12-13 DIAGNOSIS — M5442 Lumbago with sciatica, left side: Secondary | ICD-10-CM | POA: Diagnosis not present

## 2014-12-13 MED ORDER — OXYCODONE-ACETAMINOPHEN 5-325 MG PO TABS: 1.0000 | ORAL_TABLET | ORAL | Status: DC | PRN

## 2014-12-13 MED ORDER — ORPHENADRINE CITRATE ER 100 MG PO TB12: 100.0000 mg | ORAL_TABLET | Freq: Two times a day (BID) | ORAL | Status: DC

## 2014-12-13 MED ORDER — CYCLOBENZAPRINE HCL 10 MG PO TABS
10.0000 mg | ORAL_TABLET | Freq: Once | ORAL | Status: AC
  Administered 2014-12-13: 10 mg via ORAL
  Filled 2014-12-13: qty 1

## 2014-12-13 MED ORDER — PREDNISONE 20 MG PO TABS
ORAL_TABLET | ORAL | Status: DC
Start: 2014-12-13 — End: 2015-07-02

## 2014-12-13 MED ORDER — HYDROMORPHONE HCL 1 MG/ML IJ SOLN
1.0000 mg | Freq: Once | INTRAMUSCULAR | Status: AC
  Administered 2014-12-13: 1 mg via INTRAMUSCULAR
  Filled 2014-12-13: qty 1

## 2014-12-13 MED ORDER — KETOROLAC TROMETHAMINE 60 MG/2ML IM SOLN
60.0000 mg | Freq: Once | INTRAMUSCULAR | Status: AC
  Administered 2014-12-13: 60 mg via INTRAMUSCULAR
  Filled 2014-12-13: qty 2

## 2014-12-13 NOTE — ED Provider Notes (Signed)
CSN: [id number]     Arrival date & time 12/13/14  [id number] History   First MD Initiated Contact with Patient 12/13/14 1031     Chief Complaint  Patient presents with  . Back Pain  . Leg Pain     (Consider location/radiation/quality/duration/timing/severity/associated sxs/prior Treatment) HPI Patient states he did yard work about 4-5 days ago and now has developed severe pain in his left lower back and buttock. He reports this is a sharp, electric like pain that goes from his very low back down the buttock and back of the leg. There is no good position to get relief. No associated abdominal pain or urinary symptoms. No associate fever chills. Patient denies he's having any weakness in the legs or numbness. He reports he's occasionally had some back pain but never this severe. Past Medical History  Diagnosis Date  . Hyperlipidemia   . CAD (coronary artery disease)   . Depression   . ED (erectile dysfunction)   . Lung nodule   . Hepatic lesion     left lobe  . Tubular adenoma     polyps- Dr.Edwards  . Pneumonia   . Nasal congestion   . Change in voice   . Cough   . Thyroid disease     thyroid lesion  . Diabetes mellitus 04-25-11    oral meds  . Osteoarthritis 04-25-11    spine area-some neck issues  . Hypothyroidism   . PONV (postoperative nausea and vomiting)   . Hypertension     dr Marilynn Rail   Past Surgical History  Procedure Laterality Date  . Knee surgery  04-25-11    right/ left knee scope  . Eye surgery  11/2010    cataract x2  . Eye surgery  12/2010    len implant x2   . Thyroidectomy  05/02/2011    Procedure: THYROIDECTOMY;  Surgeon: Earnstine Regal, MD;  Location: [facility];  Service: General;  Laterality: N/A;  Total Thyroidectomy  . Cardiac catheterization  04-25-11    many yrs ago-very slight blockage 1 vessel (60% RCA by Dr. Dorthy Cooler notes)  . Total knee arthroplasty Right 04/25/2013    Procedure: TOTAL KNEE ARTHROPLASTY;  Surgeon: Kerin Salen, MD;  Location: [place];   Service: Orthopedics;  Laterality: Right;   Family History  Problem Relation Age of Onset  . COPD Other   . Hypertension Other   . Hyperlipidemia Other   . Heart attack Other   . Cancer Brother     colon   Social History  Substance Use Topics  . Smoking status: Former Smoker -- 75 years    Quit date: 11/24/2009  . Smokeless tobacco: Never Used  . Alcohol Use: Yes     Comment: Beer on weekends, 6 or more    Review of Systems  10 Systems reviewed and are negative for acute change except as noted in the HPI.   Allergies  Codeine; Actos; Glyburide; and Metformin and related  Home Medications   Prior to Admission medications   Medication Sig Start Date End Date Taking? Authorizing Provider  amLODipine (NORVASC) 10 MG tablet Take 10 mg by mouth daily.   Yes Historical Provider, MD  aspirin 81 MG tablet Take 81 mg by mouth daily.   Yes Historical Provider, MD  atorvastatin (LIPITOR) 80 MG tablet Take 40 mg by mouth at bedtime.   Yes Historical Provider, MD  carvedilol (COREG) 6.25 MG tablet Take 6.25 mg by mouth 2 (two) times  daily with a meal.   Yes Historical Provider, MD  fexofenadine (ALLEGRA) 180 MG tablet Take 180 mg by mouth daily as needed. Allergies    Yes Historical Provider, MD  fluticasone (VERAMYST) 27.5 MCG/SPRAY nasal spray Place 2 sprays into the nose daily as needed for allergies.    Yes Historical Provider, MD  glimepiride (AMARYL) 4 MG tablet Take 8 mg by mouth daily with breakfast.   Yes Historical Provider, MD  hydrochlorothiazide (HYDRODIURIL) 25 MG tablet Take 25 mg by mouth daily.   Yes Historical Provider, MD  levothyroxine (SYNTHROID, LEVOTHROID) 150 MCG tablet Take 150 mcg by mouth daily before breakfast.   Yes Historical Provider, MD  losartan (COZAAR) 100 MG tablet Take 100 mg by mouth daily.    Yes Historical Provider, MD  metFORMIN (GLUCOPHAGE-XR) 500 MG 24 hr tablet Take 1,000 mg by mouth 2 (two) times daily.   Yes Historical Provider, MD   pantoprazole (PROTONIX) 40 MG tablet Take 40 mg by mouth daily.   Yes Historical Provider, MD  valsartan (DIOVAN) 320 MG tablet Take 320 mg by mouth daily.   Yes Historical Provider, MD  orphenadrine (NORFLEX) 100 MG tablet Take 1 tablet (100 mg total) by mouth 2 (two) times daily. 12/13/14   Charlesetta Shanks, MD  oxyCODONE-acetaminophen (PERCOCET/ROXICET) 5-325 MG tablet Take 1-2 tablets by mouth every 4 (four) hours as needed for severe pain. 12/13/14   Charlesetta Shanks, MD  predniSONE (DELTASONE) 20 MG tablet 3 tabs po day one, then 2 po daily x 4 days 12/13/14   Charlesetta Shanks, MD   BP 144/72 mmHg  Pulse 67  Temp(Src) 97.7 F (36.5 C) (Oral)  Resp 16  Ht 5\' 10"  (1.778 m)  Wt 215 lb 8 oz (97.75 kg)  BMI 30.92 kg/m2  SpO2 94% Physical Exam  Constitutional: He is oriented to person, place, and time. He appears well-developed and well-nourished.  Patient is nontoxic and alert but appears to be in pain. No respiratory distress.  HENT:  Head: Normocephalic and atraumatic.  Eyes: EOM are normal.  Neck: Neck supple.  Cardiovascular: Normal rate, regular rhythm, normal heart sounds and intact distal pulses.   Pulmonary/Chest: Effort normal and breath sounds normal.  Abdominal: Soft. Bowel sounds are normal. He exhibits no distension. There is no tenderness.  Musculoskeletal: Normal range of motion. He exhibits tenderness. He exhibits no edema.  Patient endorses tenderness to palpation over the left SI joint. He reports with movement and palpation pain is radiating down the back of his leg. The lower extremities are in good condition. They're symmetric without edema. Skin condition is good. Normal muscular condition. Feet are warm and dry. Patient has normal motion albeit with pain in the left. Sensation is intact to light touch.  Neurological: He is alert and oriented to person, place, and time. He has normal strength. Coordination normal. GCS eye subscore is 4. GCS verbal subscore is 5. GCS  motor subscore is 6.  Skin: Skin is warm, dry and intact.  Psychiatric: He has a normal mood and affect.    ED Course  Procedures (including critical care time) Labs Review Labs Reviewed - No data to display  Imaging Review No results found. I have personally reviewed and evaluated these images and lab results as part of my medical decision-making.   EKG Interpretation None     Recheck at 1300 patient is much improved. MDM   Final diagnoses:  Sciatica of left side  Radiculopathy of sacral region   Patient presents  with pain classically located in the SI joint region and radiation down the back of the leg. At this time there is no associated neurologic dysfunction. There is no associated abdominal complaints or GU complaint. The patient is otherwise well at this time. He is treated for pain with good relief. He will be placed on a short course of prednisone with Percocet and Norflex to take for pain control. He is counseled on follow-up with his family physician for response to treatment.    Charlesetta Shanks, MD 12/13/14 [phone]

## 2014-12-13 NOTE — Discharge Instructions (Signed)

## 2014-12-13 NOTE — ED Notes (Signed)
Pt. Stated, I've had left buttocks pain and goes down my left leg.

## 2014-12-22 ENCOUNTER — Emergency Department (HOSPITAL_COMMUNITY)
Admission: EM | Admit: 2014-12-22 | Discharge: 2014-12-22 | Disposition: A | Discharge status: Home / Self Care | Payer: Commercial Managed Care - HMO | Attending: Emergency Medicine | Admitting: Emergency Medicine

## 2014-12-22 ENCOUNTER — Encounter (HOSPITAL_COMMUNITY): Payer: Self-pay | Admitting: Emergency Medicine

## 2014-12-22 DIAGNOSIS — Z87438 Personal history of other diseases of male genital organs: Secondary | ICD-10-CM | POA: Diagnosis not present

## 2014-12-22 DIAGNOSIS — Z8701 Personal history of pneumonia (recurrent): Secondary | ICD-10-CM | POA: Insufficient documentation

## 2014-12-22 DIAGNOSIS — M5432 Sciatica, left side: Secondary | ICD-10-CM | POA: Diagnosis not present

## 2014-12-22 DIAGNOSIS — Z8659 Personal history of other mental and behavioral disorders: Secondary | ICD-10-CM | POA: Diagnosis not present

## 2014-12-22 DIAGNOSIS — Z87891 Personal history of nicotine dependence: Secondary | ICD-10-CM | POA: Insufficient documentation

## 2014-12-22 DIAGNOSIS — Z7982 Long term (current) use of aspirin: Secondary | ICD-10-CM | POA: Diagnosis not present

## 2014-12-22 DIAGNOSIS — I2510 Atherosclerotic heart disease of native coronary artery without angina pectoris: Secondary | ICD-10-CM | POA: Insufficient documentation

## 2014-12-22 DIAGNOSIS — I10 Essential (primary) hypertension: Secondary | ICD-10-CM | POA: Diagnosis not present

## 2014-12-22 DIAGNOSIS — E785 Hyperlipidemia, unspecified: Secondary | ICD-10-CM | POA: Insufficient documentation

## 2014-12-22 DIAGNOSIS — Z9889 Other specified postprocedural states: Secondary | ICD-10-CM | POA: Insufficient documentation

## 2014-12-22 DIAGNOSIS — Z85038 Personal history of other malignant neoplasm of large intestine: Secondary | ICD-10-CM | POA: Diagnosis not present

## 2014-12-22 DIAGNOSIS — Z79899 Other long term (current) drug therapy: Secondary | ICD-10-CM | POA: Diagnosis not present

## 2014-12-22 DIAGNOSIS — E039 Hypothyroidism, unspecified: Secondary | ICD-10-CM | POA: Diagnosis not present

## 2014-12-22 DIAGNOSIS — E119 Type 2 diabetes mellitus without complications: Secondary | ICD-10-CM | POA: Insufficient documentation

## 2014-12-22 DIAGNOSIS — Z8719 Personal history of other diseases of the digestive system: Secondary | ICD-10-CM | POA: Insufficient documentation

## 2014-12-22 DIAGNOSIS — M545 Low back pain: Secondary | ICD-10-CM | POA: Diagnosis present

## 2014-12-22 DIAGNOSIS — Z7951 Long term (current) use of inhaled steroids: Secondary | ICD-10-CM | POA: Insufficient documentation

## 2014-12-22 MED ORDER — KETOROLAC TROMETHAMINE 60 MG/2ML IM SOLN
60.0000 mg | Freq: Once | INTRAMUSCULAR | Status: AC
  Administered 2014-12-22: 60 mg via INTRAMUSCULAR
  Filled 2014-12-22: qty 2

## 2014-12-22 NOTE — Discharge Instructions (Signed)
Call your primary care provider and get a visit as soon as possible. Continue your medications from last visit. Use heat or ice to the area. Use your naproxen at home for pain relief. Back exercises are provided that you may try for symptom relief.    Sciatica With Rehab The sciatic nerve runs from the back down the leg and is responsible for sensation and control of the muscles in the back (posterior) side of the thigh, lower leg, and foot. Sciatica is a condition that is characterized by inflammation of this nerve.  SYMPTOMS   Signs of nerve damage, including numbness and/or weakness along the posterior side of the lower extremity.  Pain in the back of the thigh that may also travel down the leg.  Pain that worsens when sitting for long periods of time.  Occasionally, pain in the back or buttock. CAUSES  Inflammation of the sciatic nerve is the cause of sciatica. The inflammation is due to something irritating the nerve. Common sources of irritation include:  Sitting for long periods of time.  Direct trauma to the nerve.  Arthritis of the spine.  Herniated or ruptured disk.  Slipping of the vertebrae (spondylolisthesis).  Pressure from soft tissues, such as muscles or ligament-like tissue (fascia). RISK INCREASES WITH:  Sports that place pressure or stress on the spine (football or weightlifting).  Poor strength and flexibility.  Failure to warm up properly before activity.  Family history of low back pain or disk disorders.  Previous back injury or surgery.  Poor body mechanics, especially when lifting, or poor posture. PREVENTION   Warm up and stretch properly before activity.  Maintain physical fitness:  Strength, flexibility, and endurance.  Cardiovascular fitness.  Learn and use proper technique, especially with posture and lifting. When possible, have coach correct improper technique.  Avoid activities that place stress on the spine. PROGNOSIS If  treated properly, then sciatica usually resolves within 6 weeks. However, occasionally surgery is necessary.  RELATED COMPLICATIONS   Permanent nerve damage, including pain, numbness, tingle, or weakness.  Chronic back pain.  Risks of surgery: infection, bleeding, nerve damage, or damage to surrounding tissues. TREATMENT Treatment initially involves resting from any activities that aggravate your symptoms. The use of ice and medication may help reduce pain and inflammation. The use of strengthening and stretching exercises may help reduce pain with activity. These exercises may be performed at home or with referral to a therapist. A therapist may recommend further treatments, such as transcutaneous electronic nerve stimulation (TENS) or ultrasound. Your caregiver may recommend corticosteroid injections to help reduce inflammation of the sciatic nerve. If symptoms persist despite non-surgical (conservative) treatment, then surgery may be recommended. MEDICATION  If pain medication is necessary, then nonsteroidal anti-inflammatory medications, such as aspirin and ibuprofen, or other minor pain relievers, such as acetaminophen, are often recommended.  Do not take pain medication for 7 days before surgery.  Prescription pain relievers may be given if deemed necessary by your caregiver. Use only as directed and only as much as you need.  Ointments applied to the skin may be helpful.  Corticosteroid injections may be given by your caregiver. These injections should be reserved for the most serious cases, because they may only be given a certain number of times. HEAT AND COLD  Cold treatment (icing) relieves pain and reduces inflammation. Cold treatment should be applied for 10 to 15 minutes every 2 to 3 hours for inflammation and pain and immediately after any activity that aggravates your symptoms.  Use ice packs or massage the area with a piece of ice (ice massage).  Heat treatment may be used  prior to performing the stretching and strengthening activities prescribed by your caregiver, physical therapist, or athletic trainer. Use a heat pack or soak the injury in warm water. SEEK MEDICAL CARE IF:  Treatment seems to offer no benefit, or the condition worsens.  Any medications produce adverse side effects. EXERCISES  RANGE OF MOTION (ROM) AND STRETCHING EXERCISES - Sciatica Most people with sciatic will find that their symptoms worsen with either excessive bending forward (flexion) or arching at the low back (extension). The exercises which will help resolve your symptoms will focus on the opposite motion. Your physician, physical therapist or athletic trainer will help you determine which exercises will be most helpful to resolve your low back pain. Do not complete any exercises without first consulting with your clinician. Discontinue any exercises which worsen your symptoms until you speak to your clinician. If you have pain, numbness or tingling which travels down into your buttocks, leg or foot, the goal of the therapy is for these symptoms to move closer to your back and eventually resolve. Occasionally, these leg symptoms will get better, but your low back pain may worsen; this is typically an indication of progress in your rehabilitation. Be certain to be very alert to any changes in your symptoms and the activities in which you participated in the 24 hours prior to the change. Sharing this information with your clinician will allow him/her to most efficiently treat your condition. These exercises may help you when beginning to rehabilitate your injury. Your symptoms may resolve with or without further involvement from your physician, physical therapist or athletic trainer. While completing these exercises, remember:   Restoring tissue flexibility helps normal motion to return to the joints. This allows healthier, less painful movement and activity.  An effective stretch should be held  for at least 30 seconds.  A stretch should never be painful. You should only feel a gentle lengthening or release in the stretched tissue. FLEXION RANGE OF MOTION AND STRETCHING EXERCISES: STRETCH - Flexion, Single Knee to Chest   Lie on a firm bed or floor with both legs extended in front of you.  Keeping one leg in contact with the floor, bring your opposite knee to your chest. Hold your leg in place by either grabbing behind your thigh or at your knee.  Pull until you feel a gentle stretch in your low back. Hold __________ seconds.  Slowly release your grasp and repeat the exercise with the opposite side. Repeat __________ times. Complete this exercise __________ times per day.  STRETCH - Flexion, Double Knee to Chest  Lie on a firm bed or floor with both legs extended in front of you.  Keeping one leg in contact with the floor, bring your opposite knee to your chest.  Tense your stomach muscles to support your back and then lift your other knee to your chest. Hold your legs in place by either grabbing behind your thighs or at your knees.  Pull both knees toward your chest until you feel a gentle stretch in your low back. Hold __________ seconds.  Tense your stomach muscles and slowly return one leg at a time to the floor. Repeat __________ times. Complete this exercise __________ times per day.  STRETCH - Low Trunk Rotation   Lie on a firm bed or floor. Keeping your legs in front of you, bend your knees so they are  both pointed toward the ceiling and your feet are flat on the floor.  Extend your arms out to the side. This will stabilize your upper body by keeping your shoulders in contact with the floor.  Gently and slowly drop both knees together to one side until you feel a gentle stretch in your low back. Hold for __________ seconds.  Tense your stomach muscles to support your low back as you bring your knees back to the starting position. Repeat the exercise to the other  side. Repeat __________ times. Complete this exercise __________ times per day  EXTENSION RANGE OF MOTION AND FLEXIBILITY EXERCISES: STRETCH - Extension, Prone on Elbows  Lie on your stomach on the floor, a bed will be too soft. Place your palms about shoulder width apart and at the height of your head.  Place your elbows under your shoulders. If this is too painful, stack pillows under your chest.  Allow your body to relax so that your hips drop lower and make contact more completely with the floor.  Hold this position for __________ seconds.  Slowly return to lying flat on the floor. Repeat __________ times. Complete this exercise __________ times per day.  RANGE OF MOTION - Extension, Prone Press Ups  Lie on your stomach on the floor, a bed will be too soft. Place your palms about shoulder width apart and at the height of your head.  Keeping your back as relaxed as possible, slowly straighten your elbows while keeping your hips on the floor. You may adjust the placement of your hands to maximize your comfort. As you gain motion, your hands will come more underneath your shoulders.  Hold this position __________ seconds.  Slowly return to lying flat on the floor. Repeat __________ times. Complete this exercise __________ times per day.  STRENGTHENING EXERCISES - Sciatica  These exercises may help you when beginning to rehabilitate your injury. These exercises should be done near your "sweet spot." This is the neutral, low-back arch, somewhere between fully rounded and fully arched, that is your least painful position. When performed in this safe range of motion, these exercises can be used for people who have either a flexion or extension based injury. These exercises may resolve your symptoms with or without further involvement from your physician, physical therapist or athletic trainer. While completing these exercises, remember:   Muscles can gain both the endurance and the strength  needed for everyday activities through controlled exercises.  Complete these exercises as instructed by your physician, physical therapist or athletic trainer. Progress with the resistance and repetition exercises only as your caregiver advises.  You may experience muscle soreness or fatigue, but the pain or discomfort you are trying to eliminate should never worsen during these exercises. If this pain does worsen, stop and make certain you are following the directions exactly. If the pain is still present after adjustments, discontinue the exercise until you can discuss the trouble with your clinician. STRENGTHENING - Deep Abdominals, Pelvic Tilt   Lie on a firm bed or floor. Keeping your legs in front of you, bend your knees so they are both pointed toward the ceiling and your feet are flat on the floor.  Tense your lower abdominal muscles to press your low back into the floor. This motion will rotate your pelvis so that your tail bone is scooping upwards rather than pointing at your feet or into the floor.  With a gentle tension and even breathing, hold this position for __________ seconds. Repeat  __________ times. Complete this exercise __________ times per day.  STRENGTHENING - Abdominals, Crunches   Lie on a firm bed or floor. Keeping your legs in front of you, bend your knees so they are both pointed toward the ceiling and your feet are flat on the floor. Cross your arms over your chest.  Slightly tip your chin down without bending your neck.  Tense your abdominals and slowly lift your trunk high enough to just clear your shoulder blades. Lifting higher can put excessive stress on the low back and does not further strengthen your abdominal muscles.  Control your return to the starting position. Repeat __________ times. Complete this exercise __________ times per day.  STRENGTHENING - Quadruped, Opposite UE/LE Lift  Assume a hands and knees position on a firm surface. Keep your hands  under your shoulders and your knees under your hips. You may place padding under your knees for comfort.  Find your neutral spine and gently tense your abdominal muscles so that you can maintain this position. Your shoulders and hips should form a rectangle that is parallel with the floor and is not twisted.  Keeping your trunk steady, lift your right hand no higher than your shoulder and then your left leg no higher than your hip. Make sure you are not holding your breath. Hold this position __________ seconds.  Continuing to keep your abdominal muscles tense and your back steady, slowly return to your starting position. Repeat with the opposite arm and leg. Repeat __________ times. Complete this exercise __________ times per day.  STRENGTHENING - Abdominals and Quadriceps, Straight Leg Raise   Lie on a firm bed or floor with both legs extended in front of you.  Keeping one leg in contact with the floor, bend the other knee so that your foot can rest flat on the floor.  Find your neutral spine, and tense your abdominal muscles to maintain your spinal position throughout the exercise.  Slowly lift your straight leg off the floor about 6 inches for a count of 15, making sure to not hold your breath.  Still keeping your neutral spine, slowly lower your leg all the way to the floor. Repeat this exercise with each leg __________ times. Complete this exercise __________ times per day. POSTURE AND BODY MECHANICS CONSIDERATIONS - Sciatica Keeping correct posture when sitting, standing or completing your activities will reduce the stress put on different body tissues, allowing injured tissues a chance to heal and limiting painful experiences. The following are general guidelines for improved posture. Your physician or physical therapist will provide you with any instructions specific to your needs. While reading these guidelines, remember:  The exercises prescribed by your provider will help you have  the flexibility and strength to maintain correct postures.  The correct posture provides the optimal environment for your joints to work. All of your joints have less wear and tear when properly supported by a spine with good posture. This means you will experience a healthier, less painful body.  Correct posture must be practiced with all of your activities, especially prolonged sitting and standing. Correct posture is as important when doing repetitive low-stress activities (typing) as it is when doing a single heavy-load activity (lifting). RESTING POSITIONS Consider which positions are most painful for you when choosing a resting position. If you have pain with flexion-based activities (sitting, bending, stooping, squatting), choose a position that allows you to rest in a less flexed posture. You would want to avoid curling into a fetal position  on your side. If your pain worsens with extension-based activities (prolonged standing, working overhead), avoid resting in an extended position such as sleeping on your stomach. Most people will find more comfort when they rest with their spine in a more neutral position, neither too rounded nor too arched. Lying on a non-sagging bed on your side with a pillow between your knees, or on your back with a pillow under your knees will often provide some relief. Keep in mind, being in any one position for a prolonged period of time, no matter how correct your posture, can still lead to stiffness. PROPER SITTING POSTURE In order to minimize stress and discomfort on your spine, you must sit with correct posture Sitting with good posture should be effortless for a healthy body. Returning to good posture is a gradual process. Many people can work toward this most comfortably by using various supports until they have the flexibility and strength to maintain this posture on their own. When sitting with proper posture, your ears will fall over your shoulders and your  shoulders will fall over your hips. You should use the back of the chair to support your upper back. Your low back will be in a neutral position, just slightly arched. You may place a small pillow or folded towel at the base of your low back for support.  When working at a desk, create an environment that supports good, upright posture. Without extra support, muscles fatigue and lead to excessive strain on joints and other tissues. Keep these recommendations in mind: CHAIR:   A chair should be able to slide under your desk when your back makes contact with the back of the chair. This allows you to work closely.  The chair's height should allow your eyes to be level with the upper part of your monitor and your hands to be slightly lower than your elbows. BODY POSITION  Your feet should make contact with the floor. If this is not possible, use a foot rest.  Keep your ears over your shoulders. This will reduce stress on your neck and low back. INCORRECT SITTING POSTURES   If you are feeling tired and unable to assume a healthy sitting posture, do not slouch or slump. This puts excessive strain on your back tissues, causing more damage and pain. Healthier options include:  Using more support, like a lumbar pillow.  Switching tasks to something that requires you to be upright or walking.  Talking a brief walk.  Lying down to rest in a neutral-spine position. PROLONGED STANDING WHILE SLIGHTLY LEANING FORWARD  When completing a task that requires you to lean forward while standing in one place for a long time, place either foot up on a stationary 2-4 inch high object to help maintain the best posture. When both feet are on the ground, the low back tends to lose its slight inward curve. If this curve flattens (or becomes too large), then the back and your other joints will experience too much stress, fatigue more quickly and can cause pain.  CORRECT STANDING POSTURES Proper standing posture should  be assumed with all daily activities, even if they only take a few moments, like when brushing your teeth. As in sitting, your ears should fall over your shoulders and your shoulders should fall over your hips. You should keep a slight tension in your abdominal muscles to brace your spine. Your tailbone should point down to the ground, not behind your body, resulting in an over-extended swayback posture.  INCORRECT STANDING POSTURES  Common incorrect standing postures include a forward head, locked knees and/or an excessive swayback. WALKING Walk with an upright posture. Your ears, shoulders and hips should all line-up. PROLONGED ACTIVITY IN A FLEXED POSITION When completing a task that requires you to bend forward at your waist or lean over a low surface, try to find a way to stabilize 3 of 4 of your limbs. You can place a hand or elbow on your thigh or rest a knee on the surface you are reaching across. This will provide you more stability so that your muscles do not fatigue as quickly. By keeping your knees relaxed, or slightly bent, you will also reduce stress across your low back. CORRECT LIFTING TECHNIQUES DO :   Assume a wide stance. This will provide you more stability and the opportunity to get as close as possible to the object which you are lifting.  Tense your abdominals to brace your spine; then bend at the knees and hips. Keeping your back locked in a neutral-spine position, lift using your leg muscles. Lift with your legs, keeping your back straight.  Test the weight of unknown objects before attempting to lift them.  Try to keep your elbows locked down at your sides in order get the best strength from your shoulders when carrying an object.  Always ask for help when lifting heavy or awkward objects. INCORRECT LIFTING TECHNIQUES DO NOT:   Lock your knees when lifting, even if it is a small object.  Bend and twist. Pivot at your feet or move your feet when needing to change  directions.  Assume that you cannot safely pick up a paperclip without proper posture.   This information is not intended to replace advice given to you by your health care provider. Make sure you discuss any questions you have with your health care provider.   Document Released: 01/09/2005 Document Revised: 05/26/2014 Document Reviewed: 04/23/2008 [employer] Interactive Patient Education [employer].

## 2014-12-22 NOTE — ED Notes (Signed)
Pt was seen here on 12/03/14 with left sided back pain and was diagnosed with sciatica and treated with pain medicine and a steroid pt states pain was better while on medicine and medication was finished on Friday and now pain in back in left side of back into left hip. Pt also reports tingling going into left leg. Denies any loss of bowels or bladder.

## 2014-12-22 NOTE — ED Provider Notes (Signed)
CSN: [id number]     Arrival date & time 12/22/14  1151 History  By signing my name below, I, Meriel Pica, attest that this documentation has been prepared under the direction and in the presence of Josiah Wojtaszek, PA-C. Electronically Signed: Meriel Pica, ED Scribe. 12/22/2014. 4:33 PM.   Chief Complaint  Patient presents with  . Back Pain   The history is provided by the patient. No language interpreter was used.   HPI Comments: Christopher Patel is a 69 y.o. male, with a PMhx of HTN, who presents to the Emergency Department complaining of pain to left lower back and left buttock onset 16 days ago after performing yard work that radiates laterally down LLE with tingling in the extremity. Pt notes improvement of his left lower back pain with percocet that was prescribed on last ED visit but states he ran out of this prescription 4 days ago. The pt was evaluated in the ED 9 days ago for the same complaint of back pain when he was treated for sciatica with relief of pain in the ED and then discharged with a prescription for a short course of prednisone, Percocet and Norflex. The pt made a follow up appointment with his PCP, Dr. Dorthy Cooler but was unable to keep the appointment stating he could not drive on the pain medication. He has reschedule the appointment with his PCP for 12/12 but reports he could not wait until then to be seen for his pain. His back pain is worse with sitting for long periods of time. The pt has alternated applying ice and using a heating pad. He reports moderate relief with heat to the lower back. He has also been using the muscle relaxant with mild relief. Pt additionally has a Naprosyn prescription at home. He is ambulatory without difficulty. No trauma or injury to the area. Denies rashes, numbness, weakness, bowel or bladder incontinence, fevers or chills, CP, SOB, and abdominal pain.   Past Medical History  Diagnosis Date  . Hyperlipidemia   . CAD (coronary artery disease)    . Depression   . ED (erectile dysfunction)   . Lung nodule   . Hepatic lesion     left lobe  . Tubular adenoma     polyps- Dr.Edwards  . Pneumonia   . Nasal congestion   . Change in voice   . Cough   . Thyroid disease     thyroid lesion  . Diabetes mellitus 04-25-11    oral meds  . Osteoarthritis 04-25-11    spine area-some neck issues  . Hypothyroidism   . PONV (postoperative nausea and vomiting)   . Hypertension     dr Marilynn Rail   Past Surgical History  Procedure Laterality Date  . Knee surgery  04-25-11    right/ left knee scope  . Eye surgery  11/2010    cataract x2  . Eye surgery  12/2010    len implant x2   . Thyroidectomy  05/02/2011    Procedure: THYROIDECTOMY;  Surgeon: Earnstine Regal, MD;  Location: [facility];  Service: General;  Laterality: N/A;  Total Thyroidectomy  . Cardiac catheterization  04-25-11    many yrs ago-very slight blockage 1 vessel (60% RCA by Dr. Dorthy Cooler notes)  . Total knee arthroplasty Right 04/25/2013    Procedure: TOTAL KNEE ARTHROPLASTY;  Surgeon: Kerin Salen, MD;  Location: [place];  Service: Orthopedics;  Laterality: Right;   Family History  Problem Relation Age of Onset  .  COPD Other   . Hypertension Other   . Hyperlipidemia Other   . Heart attack Other   . Cancer Brother     colon   Social History  Substance Use Topics  . Smoking status: Former Smoker -- 44 years    Quit date: 11/24/2009  . Smokeless tobacco: Never Used  . Alcohol Use: Yes     Comment: Beer on weekends, 6 or more    Review of Systems  Constitutional: Negative for fever and chills.  Respiratory: Negative for shortness of breath.   Cardiovascular: Negative for chest pain.  Gastrointestinal: Negative for abdominal pain.  Musculoskeletal: Positive for back pain. Negative for gait problem.  Skin: Negative for rash.  Neurological: Negative for weakness and numbness.  All other systems reviewed and are negative.  Allergies  Codeine; Actos; Glyburide; and  Metformin and related  Home Medications   Prior to Admission medications   Medication Sig Start Date End Date Taking? Authorizing Provider  amLODipine (NORVASC) 10 MG tablet Take 10 mg by mouth daily.    Historical Provider, MD  aspirin 81 MG tablet Take 81 mg by mouth daily.    Historical Provider, MD  atorvastatin (LIPITOR) 80 MG tablet Take 40 mg by mouth at bedtime.    Historical Provider, MD  carvedilol (COREG) 6.25 MG tablet Take 6.25 mg by mouth 2 (two) times daily with a meal.    Historical Provider, MD  fexofenadine (ALLEGRA) 180 MG tablet Take 180 mg by mouth daily as needed. Allergies     Historical Provider, MD  fluticasone (VERAMYST) 27.5 MCG/SPRAY nasal spray Place 2 sprays into the nose daily as needed for allergies.     Historical Provider, MD  glimepiride (AMARYL) 4 MG tablet Take 8 mg by mouth daily with breakfast.    Historical Provider, MD  hydrochlorothiazide (HYDRODIURIL) 25 MG tablet Take 25 mg by mouth daily.    Historical Provider, MD  levothyroxine (SYNTHROID, LEVOTHROID) 150 MCG tablet Take 150 mcg by mouth daily before breakfast.    Historical Provider, MD  losartan (COZAAR) 100 MG tablet Take 100 mg by mouth daily.     Historical Provider, MD  metFORMIN (GLUCOPHAGE-XR) 500 MG 24 hr tablet Take 1,000 mg by mouth 2 (two) times daily.    Historical Provider, MD  orphenadrine (NORFLEX) 100 MG tablet Take 1 tablet (100 mg total) by mouth 2 (two) times daily. 12/13/14   Charlesetta Shanks, MD  oxyCODONE-acetaminophen (PERCOCET/ROXICET) 5-325 MG tablet Take 1-2 tablets by mouth every 4 (four) hours as needed for severe pain. 12/13/14   Charlesetta Shanks, MD  pantoprazole (PROTONIX) 40 MG tablet Take 40 mg by mouth daily.    Historical Provider, MD  predniSONE (DELTASONE) 20 MG tablet 3 tabs po day one, then 2 po daily x 4 days 12/13/14   Charlesetta Shanks, MD  valsartan (DIOVAN) 320 MG tablet Take 320 mg by mouth daily.    Historical Provider, MD   BP 165/84 mmHg  Pulse 72   Temp(Src) 98.2 F (36.8 C) (Oral)  Resp 17  Wt 212 lb 6 oz (96.333 kg)  SpO2 96% Physical Exam  Constitutional: He appears well-developed and well-nourished. No distress.  Toxic appearing. Patient appears comfortable and in no acute distress.  HENT:  Head: Normocephalic and atraumatic.  Right Ear: External ear normal.  Left Ear: External ear normal.  Eyes: Conjunctivae are normal. Right eye exhibits no discharge. Left eye exhibits no discharge. No scleral icterus.  Neck: Normal range of motion.  Cardiovascular:  Normal rate.   Pulmonary/Chest: Effort normal.  Musculoskeletal: Normal range of motion.       Back:  Tenderness to palpation over left buttock. Straight leg raise positive on the left side. No bony deformities or focal tenderness over lumbar spine. Patient walks with a steady though slow gait.  Neurological: He is alert. Coordination normal.  5 out of 5 strength in the bilateral lower extremities. Sensation to light touch intact throughout.   Skin: Skin is warm and dry.  Psychiatric: He has a normal mood and affect. His behavior is normal.  Nursing note and vitals reviewed.   ED Course  Procedures  DIAGNOSTIC STUDIES: Oxygen Saturation is 96% on RA, adquate by my interpretation.    COORDINATION OF CARE: 4:31 PM Discussed treatment plan which includes to order Toradol injection with pt. Pt acknowledges and agrees to plan.    MDM   Final diagnoses:  Sciatica of left side   Patient presenting with back pain x 2 weeks. Patient recently evaluated in emergency department for same complaint. No change in symptomology but states he has run out of his Percocet. At last visit he was prescribed Percocet, Norflex and prednisone and encouraged to follow up with his PCP. He states he had a PCP visit but did not go because he was in too much pain to drive. He has no new complaints at this time. Afebrile. Non-focal neuro exam. Patient is able to ambulate though with some discomfort.  No loss of bowel or bladder control. No numbness or weakness in the lower extremities. No concern for cauda equina. Conservative therapy including back exercises, heat, ice, tylenol or ibuprofen discussed. Pain controlled with Toradol in emergency department. Encouraged to continue medications prescribed at last visit and keep his primary care appointment. Return precautions discussed and given in discharge paperwork. Pt is stable for discharge.   I personally performed the services described in this documentation, which was scribed in my presence. The recorded information has been reviewed and is accurate.    Josephina Gip, PA-C 12/22/14 1928  Daleen Bo, MD 12/23/14 [id number]

## 2014-12-24 DIAGNOSIS — M5430 Sciatica, unspecified side: Secondary | ICD-10-CM | POA: Diagnosis not present

## 2014-12-28 DIAGNOSIS — M5116 Intervertebral disc disorders with radiculopathy, lumbar region: Secondary | ICD-10-CM | POA: Diagnosis not present

## 2014-12-28 DIAGNOSIS — M4726 Other spondylosis with radiculopathy, lumbar region: Secondary | ICD-10-CM | POA: Diagnosis not present

## 2014-12-28 DIAGNOSIS — M4727 Other spondylosis with radiculopathy, lumbosacral region: Secondary | ICD-10-CM | POA: Diagnosis not present

## 2015-01-01 DIAGNOSIS — M5416 Radiculopathy, lumbar region: Secondary | ICD-10-CM | POA: Diagnosis not present

## 2015-01-04 DIAGNOSIS — I10 Essential (primary) hypertension: Secondary | ICD-10-CM | POA: Diagnosis not present

## 2015-01-04 DIAGNOSIS — E1165 Type 2 diabetes mellitus with hyperglycemia: Secondary | ICD-10-CM | POA: Diagnosis not present

## 2015-01-04 DIAGNOSIS — Z23 Encounter for immunization: Secondary | ICD-10-CM | POA: Diagnosis not present

## 2015-01-04 DIAGNOSIS — E039 Hypothyroidism, unspecified: Secondary | ICD-10-CM | POA: Diagnosis not present

## 2015-01-04 DIAGNOSIS — E669 Obesity, unspecified: Secondary | ICD-10-CM | POA: Diagnosis not present

## 2015-01-08 DIAGNOSIS — M5416 Radiculopathy, lumbar region: Secondary | ICD-10-CM | POA: Diagnosis not present

## 2015-01-26 DIAGNOSIS — M5416 Radiculopathy, lumbar region: Secondary | ICD-10-CM | POA: Diagnosis not present

## 2015-02-09 DIAGNOSIS — M5416 Radiculopathy, lumbar region: Secondary | ICD-10-CM | POA: Diagnosis not present

## 2015-02-22 DIAGNOSIS — M47816 Spondylosis without myelopathy or radiculopathy, lumbar region: Secondary | ICD-10-CM | POA: Diagnosis not present

## 2015-03-09 DIAGNOSIS — M47816 Spondylosis without myelopathy or radiculopathy, lumbar region: Secondary | ICD-10-CM | POA: Diagnosis not present

## 2015-03-29 DIAGNOSIS — M47816 Spondylosis without myelopathy or radiculopathy, lumbar region: Secondary | ICD-10-CM | POA: Diagnosis not present

## 2015-05-14 DIAGNOSIS — Z794 Long term (current) use of insulin: Secondary | ICD-10-CM | POA: Diagnosis not present

## 2015-05-14 DIAGNOSIS — I2510 Atherosclerotic heart disease of native coronary artery without angina pectoris: Secondary | ICD-10-CM | POA: Diagnosis not present

## 2015-05-14 DIAGNOSIS — E7800 Pure hypercholesterolemia, unspecified: Secondary | ICD-10-CM | POA: Diagnosis not present

## 2015-05-14 DIAGNOSIS — E039 Hypothyroidism, unspecified: Secondary | ICD-10-CM | POA: Diagnosis not present

## 2015-05-14 DIAGNOSIS — Z6831 Body mass index (BMI) 31.0-31.9, adult: Secondary | ICD-10-CM | POA: Diagnosis not present

## 2015-05-14 DIAGNOSIS — Z79899 Other long term (current) drug therapy: Secondary | ICD-10-CM | POA: Diagnosis not present

## 2015-05-14 DIAGNOSIS — E669 Obesity, unspecified: Secondary | ICD-10-CM | POA: Diagnosis not present

## 2015-05-14 DIAGNOSIS — E1165 Type 2 diabetes mellitus with hyperglycemia: Secondary | ICD-10-CM | POA: Diagnosis not present

## 2015-06-02 DIAGNOSIS — I10 Essential (primary) hypertension: Secondary | ICD-10-CM | POA: Diagnosis not present

## 2015-06-02 DIAGNOSIS — E1165 Type 2 diabetes mellitus with hyperglycemia: Secondary | ICD-10-CM | POA: Diagnosis not present

## 2015-06-02 DIAGNOSIS — Z7984 Long term (current) use of oral hypoglycemic drugs: Secondary | ICD-10-CM | POA: Diagnosis not present

## 2015-07-02 ENCOUNTER — Other Ambulatory Visit: Payer: Self-pay | Admitting: Physician Assistant

## 2015-07-02 ENCOUNTER — Encounter (HOSPITAL_COMMUNITY): Payer: Self-pay | Admitting: Emergency Medicine

## 2015-07-02 ENCOUNTER — Emergency Department (HOSPITAL_COMMUNITY): Payer: Commercial Managed Care - HMO

## 2015-07-02 ENCOUNTER — Observation Stay (HOSPITAL_COMMUNITY)
Admission: EM | Admit: 2015-07-02 | Discharge: 2015-07-02 | Disposition: A | Discharge status: Home / Self Care | Payer: Commercial Managed Care - HMO | Attending: Cardiology | Admitting: Cardiology

## 2015-07-02 ENCOUNTER — Observation Stay (HOSPITAL_COMMUNITY): Payer: Commercial Managed Care - HMO

## 2015-07-02 DIAGNOSIS — I2510 Atherosclerotic heart disease of native coronary artery without angina pectoris: Secondary | ICD-10-CM | POA: Insufficient documentation

## 2015-07-02 DIAGNOSIS — I480 Paroxysmal atrial fibrillation: Secondary | ICD-10-CM

## 2015-07-02 DIAGNOSIS — E119 Type 2 diabetes mellitus without complications: Secondary | ICD-10-CM | POA: Diagnosis not present

## 2015-07-02 DIAGNOSIS — Z96651 Presence of right artificial knee joint: Secondary | ICD-10-CM | POA: Insufficient documentation

## 2015-07-02 DIAGNOSIS — Z87891 Personal history of nicotine dependence: Secondary | ICD-10-CM | POA: Insufficient documentation

## 2015-07-02 DIAGNOSIS — Z7984 Long term (current) use of oral hypoglycemic drugs: Secondary | ICD-10-CM | POA: Insufficient documentation

## 2015-07-02 DIAGNOSIS — R42 Dizziness and giddiness: Secondary | ICD-10-CM | POA: Diagnosis not present

## 2015-07-02 DIAGNOSIS — E890 Postprocedural hypothyroidism: Secondary | ICD-10-CM | POA: Diagnosis present

## 2015-07-02 DIAGNOSIS — F329 Major depressive disorder, single episode, unspecified: Secondary | ICD-10-CM | POA: Diagnosis not present

## 2015-07-02 DIAGNOSIS — E785 Hyperlipidemia, unspecified: Secondary | ICD-10-CM | POA: Diagnosis not present

## 2015-07-02 DIAGNOSIS — R0602 Shortness of breath: Secondary | ICD-10-CM | POA: Diagnosis not present

## 2015-07-02 DIAGNOSIS — Z7982 Long term (current) use of aspirin: Secondary | ICD-10-CM | POA: Insufficient documentation

## 2015-07-02 DIAGNOSIS — R002 Palpitations: Secondary | ICD-10-CM | POA: Diagnosis not present

## 2015-07-02 DIAGNOSIS — R1111 Vomiting without nausea: Secondary | ICD-10-CM | POA: Diagnosis not present

## 2015-07-02 DIAGNOSIS — E039 Hypothyroidism, unspecified: Secondary | ICD-10-CM | POA: Diagnosis not present

## 2015-07-02 DIAGNOSIS — I10 Essential (primary) hypertension: Secondary | ICD-10-CM | POA: Insufficient documentation

## 2015-07-02 DIAGNOSIS — R000 Tachycardia, unspecified: Secondary | ICD-10-CM | POA: Diagnosis not present

## 2015-07-02 HISTORY — DX: Paroxysmal atrial fibrillation: I48.0

## 2015-07-02 LAB — BASIC METABOLIC PANEL
Anion gap: 11 (ref 5–15)
BUN: 8 mg/dL (ref 6–20)
CHLORIDE: 100 mmol/L — AB (ref 101–111)
CO2: 26 mmol/L (ref 22–32)
CREATININE: 0.93 mg/dL (ref 0.61–1.24)
Calcium: 9.1 mg/dL (ref 8.9–10.3)
Glucose, Bld: 145 mg/dL — ABNORMAL HIGH (ref 65–99)
Potassium: 3.7 mmol/L (ref 3.5–5.1)
Sodium: 137 mmol/L (ref 135–145)

## 2015-07-02 LAB — I-STAT TROPONIN, ED: Troponin i, poc: 0.01 ng/mL (ref 0.00–0.08)

## 2015-07-02 LAB — TROPONIN I: Troponin I: <0.03 ng/mL (ref <0.031)

## 2015-07-02 LAB — CBC WITH DIFFERENTIAL/PLATELET
Basophils Absolute: 0 10*3/uL (ref 0.0–0.1)
Basophils Relative: 0 %
EOS PCT: 1 %
Eosinophils Absolute: 0.1 10*3/uL (ref 0.0–0.7)
HEMATOCRIT: 45.2 % (ref 39.0–52.0)
Hemoglobin: 14.5 g/dL (ref 13.0–17.0)
LYMPHS PCT: 22 %
Lymphs Abs: 1.5 10*3/uL (ref 0.7–4.0)
MCH: 28.1 pg (ref 26.0–34.0)
MCHC: 32.1 g/dL (ref 30.0–36.0)
MCV: 87.6 fL (ref 78.0–100.0)
MONO ABS: 0.6 10*3/uL (ref 0.1–1.0)
Monocytes Relative: 9 %
NEUTROS ABS: 4.8 10*3/uL (ref 1.7–7.7)
Neutrophils Relative %: 68 %
PLATELETS: 217 10*3/uL (ref 150–400)
RBC: 5.16 MIL/uL (ref 4.22–5.81)
RDW: 13.5 % (ref 11.5–15.5)
WBC: 7.1 10*3/uL (ref 4.0–10.5)

## 2015-07-02 LAB — GLUCOSE, CAPILLARY: Glucose-Capillary: 203 mg/dL — ABNORMAL HIGH (ref 65–99)

## 2015-07-02 LAB — TSH: TSH: 0.439 u[IU]/mL (ref 0.350–4.500)

## 2015-07-02 MED ORDER — DILTIAZEM HCL 100 MG IV SOLR
5.0000 mg/h | INTRAVENOUS | Status: DC
  Administered 2015-07-02: 5 mg/h via INTRAVENOUS
  Filled 2015-07-02: qty 100

## 2015-07-02 MED ORDER — LEVOTHYROXINE SODIUM 75 MCG PO TABS
150.0000 ug | ORAL_TABLET | Freq: Every day | ORAL | Status: DC
  Administered 2015-07-02: 150 ug via ORAL
  Filled 2015-07-02 (×2): qty 2

## 2015-07-02 MED ORDER — ATORVASTATIN CALCIUM 40 MG PO TABS: 40.0000 mg | ORAL_TABLET | Freq: Every day | ORAL | Status: DC

## 2015-07-02 MED ORDER — ACETAMINOPHEN 325 MG PO TABS: 650.0000 mg | ORAL_TABLET | ORAL | Status: DC | PRN

## 2015-07-02 MED ORDER — METOPROLOL TARTRATE 25 MG PO TABS
25.0000 mg | ORAL_TABLET | Freq: Two times a day (BID) | ORAL | Status: DC
  Administered 2015-07-02: 25 mg via ORAL
  Filled 2015-07-02: qty 1

## 2015-07-02 MED ORDER — ONDANSETRON HCL 4 MG/2ML IJ SOLN: 4.0000 mg | Freq: Four times a day (QID) | INTRAMUSCULAR | Status: DC | PRN

## 2015-07-02 MED ORDER — METOPROLOL TARTRATE 25 MG PO TABS: 25.0000 mg | ORAL_TABLET | Freq: Two times a day (BID) | ORAL | Status: DC

## 2015-07-02 MED ORDER — DILTIAZEM HCL 25 MG/5ML IV SOLN
20.0000 mg | Freq: Once | INTRAVENOUS | Status: AC
  Administered 2015-07-02: 20 mg via INTRAVENOUS
  Filled 2015-07-02: qty 5

## 2015-07-02 MED ORDER — RIVAROXABAN 20 MG PO TABS: 20.0000 mg | ORAL_TABLET | Freq: Every day | ORAL | Status: DC

## 2015-07-02 MED ORDER — VALSARTAN 320 MG PO TABS: 160.0000 mg | ORAL_TABLET | Freq: Every day | ORAL | Status: DC

## 2015-07-02 MED ORDER — LORATADINE 10 MG PO TABS
10.0000 mg | ORAL_TABLET | Freq: Every day | ORAL | Status: DC
  Administered 2015-07-02: 10 mg via ORAL
  Filled 2015-07-02: qty 1

## 2015-07-02 MED ORDER — FLUTICASONE PROPIONATE 50 MCG/ACT NA SUSP
2.0000 | Freq: Every day | NASAL | Status: DC | PRN
  Filled 2015-07-02: qty 16

## 2015-07-02 MED ORDER — INSULIN ASPART 100 UNIT/ML ~~LOC~~ SOLN
0.0000 [IU] | Freq: Three times a day (TID) | SUBCUTANEOUS | Status: DC
  Administered 2015-07-02: 5 [IU] via SUBCUTANEOUS

## 2015-07-02 NOTE — ED Notes (Signed)
Patient transported to X-ray 

## 2015-07-02 NOTE — Plan of Care (Signed)
Problem: Pain Managment: Goal: General experience of comfort will improve Outcome: Completed/Met Date Met:  07/02/15 Pt educated on pain scale and interventions available. Pt verbalized understanding. Will continue to monitor.

## 2015-07-02 NOTE — ED Notes (Signed)
Cardiology at the bedside.

## 2015-07-02 NOTE — ED Notes (Signed)
Attempted report x 2 

## 2015-07-02 NOTE — ED Notes (Signed)
Report given to [facility], pt on bed, bed rails up, call bell at pt side. Bedside report given with Martie Round.

## 2015-07-02 NOTE — Care Management (Signed)
Pt in for Paroxysmal atrial fibrillation with RVR. Pt plan for d/c on Xarelto. Staff RN provided pt with 30 day free card. Pt with d/c orders. Pharmacy will have to make pt aware of co pay after the first 30 day. No further needs from CM at this time. Bethena Roys, RN,BSN [phone]

## 2015-07-02 NOTE — ED Notes (Signed)
Pt brought to ED by [facility] from home for SOB and palpitation, pt states he woke up feeling his heart rate raising and have one episode of nausea and vomiting, dizziness and diaphoresis,  denies any cp. VS at EMS arrival bp 130/90, HR 120-130's, R 20, 96% RA, CBG 156.

## 2015-07-02 NOTE — ED Notes (Signed)
Attempted report x1. 

## 2015-07-02 NOTE — H&P (Signed)
CARDIOLOGY HISTORY AND PHYSICAL NOTE   Patient ID: REGIONAL [name] MRN: [id number], DOB/AGE: [birth year]   Admit date: 07/02/2015 Date of Consult: 07/02/2015   Primary Physician: Lujean Amel, MD Primary Cardiologist: Dr. Marlou Porch  Pt. Profile  [name] Is a very pleasant 70 year old Caucasian male with past medical history of HTN, HLD, DM, CAD based on CT and h/o thyroid disease s/p thyroidectomy presented with afib with RVR  Problem List  Past Medical History  Diagnosis Date  . Hyperlipidemia   . CAD (coronary artery disease)   . Depression   . ED (erectile dysfunction)   . Lung nodule   . Hepatic lesion     left lobe  . Tubular adenoma     polyps- Dr.Edwards  . Pneumonia   . Nasal congestion   . Change in voice   . Cough   . Thyroid disease     thyroid lesion  . Diabetes mellitus 04-25-11    oral meds  . Osteoarthritis 04-25-11    spine area-some neck issues  . Hypothyroidism   . PONV (postoperative nausea and vomiting)   . Hypertension     dr Marilynn Rail    Past Surgical History  Procedure Laterality Date  . Knee surgery  04-25-11    right/ left knee scope  . Eye surgery  11/2010    cataract x2  . Eye surgery  12/2010    len implant x2   . Thyroidectomy  05/02/2011    Procedure: THYROIDECTOMY;  Surgeon: Earnstine Regal, MD;  Location: [facility];  Service: General;  Laterality: N/A;  Total Thyroidectomy  . Cardiac catheterization  04-25-11    many yrs ago-very slight blockage 1 vessel (60% RCA by Dr. Dorthy Cooler notes)  . Total knee arthroplasty Right 04/25/2013    Procedure: TOTAL KNEE ARTHROPLASTY;  Surgeon: Kerin Salen, MD;  Location: [place];  Service: Orthopedics;  Laterality: Right;     Allergies  Allergies  Allergen Reactions  . Codeine Itching  . Actos [Pioglitazone Hydrochloride] Nausea Only  . Glyburide     hypoglycemia  . Metformin And Related     Hypoglycemia, shakes    HPI   [name] Is a very pleasant 70 year old Caucasian male with past medical  history of HTN, HLD, DM, CAD based on CT and h/o thyroid disease s/p thyroidectomy. According to the patient, he never had a cardiac catheterization but he did have a stress test 3 years ago that was negative. I could not find out such record in the system. It does appear patient had a CT of chest without contrast on 02/14/2011 which showed atherosclerosis including left main and two-vessel coronary artery disease. According to the patient, he has occasional palpitation, which are usually relieved by rest. He denies any exertional symptom. Despite retirement, he stays active and works in his yard. He denies any recent fever, chill, cough, lower extremity edema, orthopnea or paroxysmal nocturnal dyspnea. He was essentially his usual state of health until he woke up around 3:15 AM in the morning of 07/02/2015 with persistent palpitation.  He sought medical attention at [facility], initial EKG showed atrial fibrillation with RVR. BMET shows normal renal function. CBC was normal. He denies any bleeding issues including blood in the stool blood in the urine. He denies any imbalance issues that would make him prone to fall. He was given a single dose of IV diltiazem, his heart rate came down to 110-120 range. He is no longer feeling  the palpitation at this heart rate. Initial chest x-ray was negative for acute process. Cardiology has been consulted for atrial fibrillation with RVR.   Inpatient Medications  . rivaroxaban  20 mg Oral Daily    Family History Family History  Problem Relation Age of Onset  . COPD Other   . Hypertension Other   . Hyperlipidemia Other   . Heart attack Other   . Cancer Brother     colon     Social History Social History   Social History  . Marital Status: Widowed    Spouse Name: N/A  . Number of Children: N/A  . Years of Education: N/A   Occupational History  . Not on file.   Social History Main Topics  . Smoking status: Former Smoker -- 36 years    Quit  date: 11/24/2009  . Smokeless tobacco: Never Used  . Alcohol Use: Yes     Comment: Beer on weekends, 6 or more  . Drug Use: No  . Sexual Activity: No   Other Topics Concern  . Not on file   Social History Narrative     Review of Systems  General:  No chills, fever, night sweats or weight changes.  Cardiovascular:  No chest pain, dyspnea on exertion, edema, orthopnea, paroxysmal nocturnal dyspnea. +palpitations Dermatological: No rash, lesions/masses Respiratory: No cough, dyspnea Urologic: No hematuria, dysuria Abdominal:   No nausea, diarrhea, bright red blood per rectum, melena, or hematemesis Neurologic:  No visual changes, wkns, changes in mental status. All other systems reviewed and are otherwise negative except as noted above.  Physical Exam  Blood pressure 139/94, pulse 131, temperature 98.3 F (36.8 C), temperature source Oral, resp. rate 22, height 5\' 10"  (1.778 m), weight 212 lb (96.163 kg), SpO2 97 %.  General: Pleasant, NAD Psych: Normal affect. Neuro: Alert and oriented X 3. Moves all extremities spontaneously. HEENT: Normal  Neck: Supple without bruits or JVD. Lungs:  Resp regular and unlabored, CTA. Heart: tachycardic, irregular, no s3, s4, or murmurs. Abdomen: Soft, non-tender, non-distended, BS + x 4.  Extremities: No clubbing, cyanosis or edema. DP/PT/Radials 2+ and equal bilaterally.  Labs  No results for input(s): CKTOTAL, CKMB, TROPONINI in the last 72 hours. Lab Results  Component Value Date   WBC 7.1 07/02/2015   HGB 14.5 07/02/2015   HCT 45.2 07/02/2015   MCV 87.6 07/02/2015   PLT 217 07/02/2015    Recent Labs Lab 07/02/15 0511  NA 137  K 3.7  CL 100*  CO2 26  BUN 8  CREATININE 0.93  CALCIUM 9.1  GLUCOSE 145*   Radiology/Studies  Dg Chest 2 View  07/02/2015  CLINICAL DATA:  Shortness of breath.  Atrial fibrillation. EXAM: CHEST  2 VIEW COMPARISON:  02/24/2013 FINDINGS: Normal heart size and mediastinal contours. No acute  infiltrate or edema. No effusion or pneumothorax. Thoracic spondylosis. No acute osseous findings. IMPRESSION: No evidence of active disease. Electronically Signed   By: Monte Fantasia M.D.   On: 07/02/2015 05:57    ECG  afib with RVR  ASSESSMENT AND PLAN  1. PAF with RVR  - CHA2DS2-Vasc score 4 (age, HTN, DM, CAD)  - start Xarelto, start diltiazem gtt, plan to transition to PO BB +/- diltiazem once HR controlled. Will start at low dose metoprolol tartrate 12.5mg  BID  - plan to rate control, then hopefully discharge to have outpatient DCCV in 3 weeks unless we have difficult to rate control then we will consider TEE DCCV.   -  he seems to only has cardiac awareness when his HR is fast, therefore there is a small chance that this afib may not just occurred this morning  - obtain echo. 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 Stop ASA  2. HTN: controlled, hold home ARB with plan to start IV diltiazem  3. HLD: check lipid panel  4. DM II   Signed, Almyra Deforest, PA-C 07/02/2015, 7:31 AM  Personally seen and examined. Agree with above. 71 year old fairly active man with newly discovered paroxysmal atrial fibrillation with rapid ventricular response. Asymptomatic when heart rate elevated above 130. Lungs currently clear, heart irregular irregular, no significant edema.  Seems to feel better with rate control. Current on IV diltiazem. We will convert to by mouth tomorrow. Started Xarelto 20 mg. Understands risks of bleeding. Understand stroke prevention. Decrease alcohol use. He states that he has problems sleeping, consider sleep study in the future.  He may auto convert in the next 24-48 hours.  If he remains well rate controlled tomorrow, potential discharge home. Once again in 3 weeks with an interrupted Xarelto use, we consider cardioversion if he still continues to  be in atrial fibrillation. Echocardiogram pending.  Candee Furbish, MD

## 2015-07-02 NOTE — Discharge Instructions (Signed)

## 2015-07-02 NOTE — ED Provider Notes (Signed)
CSN: [id number]     Arrival date & time 07/02/15  0450 History   First MD Initiated Contact with Patient 07/02/15 0503     Chief Complaint  Patient presents with  . Palpitations  . Shortness of Breath     (Consider location/radiation/quality/duration/timing/severity/associated sxs/prior Treatment) HPI Comments: Patient with a history of DM, HTN, HLD, CAD, thyroid goiter s/p thyroidectomy woke at 3:00 am this morning with sense of fast heart beats. No SOB or chest pain. No history of similar symptoms previously. No recent medication changes, fever. No recent alcohol use.   Patient is a 70 y.o. male presenting with palpitations and shortness of breath. The history is provided by the patient. No language interpreter was used.  Palpitations Palpitations quality:  Fast Timing:  Constant Chronicity:  New Associated symptoms: no chest pain, no nausea, no shortness of breath, no vomiting and no weakness   Shortness of Breath Associated symptoms: no chest pain, no fever and no vomiting     Past Medical History  Diagnosis Date  . Hyperlipidemia   . CAD (coronary artery disease)   . Depression   . ED (erectile dysfunction)   . Lung nodule   . Hepatic lesion     left lobe  . Tubular adenoma     polyps- Dr.Edwards  . Pneumonia   . Nasal congestion   . Change in voice   . Cough   . Thyroid disease     thyroid lesion  . Diabetes mellitus 04-25-11    oral meds  . Osteoarthritis 04-25-11    spine area-some neck issues  . Hypothyroidism   . PONV (postoperative nausea and vomiting)   . Hypertension     dr Marilynn Rail   Past Surgical History  Procedure Laterality Date  . Knee surgery  04-25-11    right/ left knee scope  . Eye surgery  11/2010    cataract x2  . Eye surgery  12/2010    len implant x2   . Thyroidectomy  05/02/2011    Procedure: THYROIDECTOMY;  Surgeon: Earnstine Regal, MD;  Location: [facility];  Service: General;  Laterality: N/A;  Total Thyroidectomy  . Cardiac  catheterization  04-25-11    many yrs ago-very slight blockage 1 vessel (60% RCA by Dr. Dorthy Cooler notes)  . Total knee arthroplasty Right 04/25/2013    Procedure: TOTAL KNEE ARTHROPLASTY;  Surgeon: Kerin Salen, MD;  Location: [facility];  Service: Orthopedics;  Laterality: Right;   Family History  Problem Relation Age of Onset  . COPD Other   . Hypertension Other   . Hyperlipidemia Other   . Heart attack Other   . Cancer Brother     colon   Social History  Substance Use Topics  . Smoking status: Former Smoker -- 59 years    Quit date: 11/24/2009  . Smokeless tobacco: Never Used  . Alcohol Use: Yes     Comment: Beer on weekends, 6 or more    Review of Systems  Constitutional: Negative for fever and chills.  HENT: Negative.   Respiratory: Negative for shortness of breath.   Cardiovascular: Positive for palpitations. Negative for chest pain.  Gastrointestinal: Negative.  Negative for nausea and vomiting.  Musculoskeletal: Negative.   Skin: Negative.   Neurological: Negative.  Negative for syncope, weakness and light-headedness.      Allergies  Codeine; Actos; Glyburide; and Metformin and related  Home Medications   Prior to Admission medications   Medication Sig Start Date  End Date Taking? Authorizing Provider  amLODipine (NORVASC) 10 MG tablet Take 10 mg by mouth daily.    Historical Provider, MD  aspirin 81 MG tablet Take 81 mg by mouth daily.    Historical Provider, MD  atorvastatin (LIPITOR) 80 MG tablet Take 40 mg by mouth at bedtime.    Historical Provider, MD  carvedilol (COREG) 6.25 MG tablet Take 6.25 mg by mouth 2 (two) times daily with a meal.    Historical Provider, MD  fexofenadine (ALLEGRA) 180 MG tablet Take 180 mg by mouth daily as needed. Allergies     Historical Provider, MD  fluticasone (VERAMYST) 27.5 MCG/SPRAY nasal spray Place 2 sprays into the nose daily as needed for allergies.     Historical Provider, MD  glimepiride (AMARYL) 4 MG tablet Take 8 mg by  mouth daily with breakfast.    Historical Provider, MD  hydrochlorothiazide (HYDRODIURIL) 25 MG tablet Take 25 mg by mouth daily.    Historical Provider, MD  levothyroxine (SYNTHROID, LEVOTHROID) 150 MCG tablet Take 150 mcg by mouth daily before breakfast.    Historical Provider, MD  losartan (COZAAR) 100 MG tablet Take 100 mg by mouth daily.     Historical Provider, MD  metFORMIN (GLUCOPHAGE-XR) 500 MG 24 hr tablet Take 1,000 mg by mouth 2 (two) times daily.    Historical Provider, MD  orphenadrine (NORFLEX) 100 MG tablet Take 1 tablet (100 mg total) by mouth 2 (two) times daily. 12/13/14   Charlesetta Shanks, MD  oxyCODONE-acetaminophen (PERCOCET/ROXICET) 5-325 MG tablet Take 1-2 tablets by mouth every 4 (four) hours as needed for severe pain. 12/13/14   Charlesetta Shanks, MD  pantoprazole (PROTONIX) 40 MG tablet Take 40 mg by mouth daily.    Historical Provider, MD  predniSONE (DELTASONE) 20 MG tablet 3 tabs po day one, then 2 po daily x 4 days 12/13/14   Charlesetta Shanks, MD  valsartan (DIOVAN) 320 MG tablet Take 320 mg by mouth daily.    Historical Provider, MD   BP 171/139 mmHg  Pulse 134  Temp(Src) 98.3 F (36.8 C) (Oral)  Resp 19  Ht 5\' 10"  (1.778 m)  Wt 96.163 kg  BMI 30.42 kg/m2  SpO2 94% Physical Exam  Constitutional: He is oriented to person, place, and time. He appears well-developed and well-nourished.  HENT:  Head: Normocephalic.  Neck: Normal range of motion. Neck supple.  Cardiovascular: Regular rhythm.  Tachycardia present.   No murmur heard. No carotid bruit.  Pulmonary/Chest: Effort normal and breath sounds normal. He has no wheezes. He has no rales. He exhibits no tenderness.  Abdominal: Soft. Bowel sounds are normal. There is no tenderness. There is no rebound and no guarding.  Musculoskeletal: Normal range of motion.  Neurological: He is alert and oriented to person, place, and time.  Skin: Skin is warm and dry. No rash noted.  Psychiatric: He has a normal mood and  affect.    ED Course  Procedures (including critical care time) Labs Review Labs Reviewed  CBC WITH DIFFERENTIAL/PLATELET  BASIC METABOLIC PANEL  I-STAT [place], ED    Imaging Review No results found. I have personally reviewed and evaluated these images and lab results as part of my medical decision-making.   EKG Interpretation   Date/Time:  Friday July 02 2015 04:56:46 EDT Ventricular Rate:  129 PR Interval:    QRS Duration: 88 QT Interval:  323 QTC Calculation: 473 R Axis:   32 Text Interpretation:  Atrial fibrillation Borderline low voltage,  extremity leads a  fib is new Confirmed by Glynn Octave [phone]) on  07/02/2015 5:04:32 AM      MDM   Final diagnoses:  None    1. New onset atrial fibrillation  The patient presents with painless palpitations that woke him from sleep around 3:00 am. Normal day yesterday. No history of arrhythmias. Not anticoagulated.  He is found to be in atrial fibrillation with RVR with rate of 130's. Cardizem bolus provided. Will reassess.   5:30 - discussed with cardiology who will send morning team to evaluate for cardioversion.     Charlann Lange, PA-C 07/06/15 2002  Everlene Balls, MD 07/14/15 [phone]

## 2015-07-02 NOTE — Progress Notes (Signed)
    He converted to normal rhythm. Excellent. We will convert to metoprolol. Diltiazem drip is off. Coronary calcium seen on CT scan. Okay with discharge. Echocardiogram as outpatient. We will seen close follow-up.  Continue with anticoagulation.  Candee Furbish, MD

## 2015-07-02 NOTE — Discharge Summary (Signed)
Discharge Summary    Patient ID: Christopher Patel,  MRN: [id number], DOB/AGE: Mar 27, 1945 70 y.o.  Admit date: 07/02/2015 Discharge date: 07/02/2015  Primary Care Provider: Lujean Amel Primary Cardiologist: Dr. Marlou Porch  Discharge Diagnoses    Principal Problem:   Paroxysmal atrial fibrillation with RVR ([place]) Active Problems:   Hypothyroidism, postsurgical   Essential hypertension, benign   Diabetes mellitus ([place])   Dyslipidemia   Allergies Allergies  Allergen Reactions  . Codeine Itching  . Actos [Pioglitazone Hydrochloride] Nausea Only  . Glyburide     hypoglycemia  . Metformin And Related     Hypoglycemia, shakes    Diagnostic Studies/Procedures    None _____________   History of Present Illness     [name] Is a very pleasant 70 year old Caucasian male with past medical history of HTN, HLD, DM, CAD based on CT and h/o thyroid disease s/p thyroidectomy. According to the patient, he never had a cardiac catheterization but he did have a stress test 3 years ago that was negative. I could not find out such record in the system. It does appear patient had a CT of chest without contrast on 02/14/2011 which showed atherosclerosis including left main and two-vessel coronary artery disease. According to the patient, he has occasional palpitation, which are usually relieved by rest. He denies any exertional symptom. Despite retirement, he stays active and works in his yard. He denies any recent fever, chill, cough, lower extremity edema, orthopnea or paroxysmal nocturnal dyspnea. He was essentially his usual state of health until he woke up around 3:15 AM in the morning of 07/02/2015 with persistent palpitation. He says he did drink 6-8 beers the day before while working in the yard.  He sought medical attention at [facility], initial EKG showed atrial fibrillation with RVR. BMET shows normal renal function. CBC was normal. He denies any bleeding issues including blood in the  stool blood in the urine. He denies any imbalance issues that would make him prone to fall. He was given a single dose of IV diltiazem, his heart rate came down to 110-120 range. He is no longer feeling the palpitation at this heart rate. Initial chest x-ray was negative for acute process. Cardiology has been consulted for atrial fibrillation with RVR.   Hospital Course     Patient was placed on IV diltiazem and Xarelto given CHA2DS2-Vasc score of 4. He spontaneously converted around 10 AM in the morning of 07/02/2015 after he arrived on the floor. Given history of coronary calcification, IV diltiazem was transitioned to metoprolol tartrate 25 mg twice a day. He tolerated well with stable BP and HR. He is deemed stable for discharge from cardiology perspective. We will obtain echo as outpatient.   He has been instructed to avoid EtOH which can push him back into afib. Outpatient followup has been arranged. He also has been instructed to take additional tablet of metoprolol as needed for recurrent palpitation. His HCTZ has been stopped. Given new BP med, I will half his valsartan for now and potentially change back to 320mg  on followup if his BP is stable. He will also need to followup with his PCP.   _____________  Discharge Vitals Blood pressure 139/82, pulse 76, temperature 98 F (36.7 C), temperature source Oral, resp. rate 18, height 5\' 10"  (1.778 m), weight 210 lb 1.6 oz (95.3 kg), SpO2 95 %.  Filed Weights   07/02/15 0502 07/02/15 0949  Weight: 212 lb (96.163 kg) 210 lb 1.6 oz (95.3  kg)    Labs & Radiologic Studies     CBC  Recent Labs  07/02/15 0511  WBC 7.1  NEUTROABS 4.8  HGB 14.5  HCT 45.2  MCV 87.6  PLT [id number]   Basic Metabolic Panel  Recent Labs  07/02/15 0511  NA 137  K 3.7  CL 100*  CO2 26  GLUCOSE 145*  BUN 8  CREATININE 0.93  CALCIUM 9.1   Cardiac Enzymes  Recent Labs  07/02/15 0947  TROPONINI <0.03   Thyroid Function Tests  Recent Labs   07/02/15 0947  TSH 0.439    Dg Chest 2 View  07/02/2015  CLINICAL DATA:  Shortness of breath.  Atrial fibrillation. EXAM: CHEST  2 VIEW COMPARISON:  02/24/2013 FINDINGS: Normal heart size and mediastinal contours. No acute infiltrate or edema. No effusion or pneumothorax. Thoracic spondylosis. No acute osseous findings. IMPRESSION: No evidence of active disease. Electronically Signed   By: Monte Fantasia M.D.   On: 07/02/2015 05:57    Disposition   Pt is being discharged home today in good condition.  Follow-up Plans & Appointments    Follow-up Information    Follow up with [facility] On 07/20/2015.   Specialty:  Cardiology   Why:  3:00PM. Outpatient echocardiogram   Contact information:   [address], [place] [phone]      Follow up with Almyra Deforest, [place] On 07/26/2015.   Specialties:  Cardiology, Radiology   Why:  1:30PM. Followup with Dr. Sabino Snipes' PA in cardiogy clinic   Contact information:   [address] [place] [address] [phone]       Schedule an appointment as soon as possible for a visit with Lujean Amel, MD.   Specialty:  Family Medicine   Contact information:   [address] [address] [phone]        Discharge Medications   Current Discharge Medication List    START taking these medications   Details  metoprolol tartrate (LOPRESSOR) 25 MG tablet Take 1 tablet (25 mg total) by mouth 2 (two) times daily. Qty: 180 tablet, Refills: 3    rivaroxaban (XARELTO) 20 MG TABS tablet Take 1 tablet (20 mg total) by mouth daily with supper. Qty: 90 tablet, Refills: 3      CONTINUE these medications which have CHANGED   Details  valsartan (DIOVAN) 320 MG tablet Take 0.5 tablets (160 mg total) by mouth daily.      CONTINUE these medications which have NOT CHANGED   Details  atorvastatin (LIPITOR) 80 MG tablet Take 40 mg by mouth at bedtime.     fexofenadine (ALLEGRA) 180 MG tablet Take 180 mg by mouth daily as needed. Allergies     fluticasone (VERAMYST) 27.5 MCG/SPRAY nasal spray Place 2 sprays into the nose daily as needed for allergies.     glimepiride (AMARYL) 4 MG tablet Take 8 mg by mouth daily with breakfast.    ibuprofen (ADVIL,MOTRIN) 200 MG tablet Take 400 mg by mouth every morning.    levothyroxine (SYNTHROID, LEVOTHROID) 150 MCG tablet Take 150 mcg by mouth daily before breakfast.    metFORMIN (GLUCOPHAGE-XR) 500 MG 24 hr tablet Take 1,000 mg by mouth 2 (two) times daily.      STOP taking these medications     amLODipine (NORVASC) 10 MG tablet      aspirin 81 MG tablet      carvedilol (COREG) 6.25 MG tablet  hydrochlorothiazide (HYDRODIURIL) 25 MG tablet          Outstanding Labs/Studies   Outpatient echocardiogram  Duration of Discharge Encounter   Greater than 30 minutes including physician time.  Signed, Almyra Deforest PA-C 07/02/2015, 1:28 PM   Personally seen and examined. Agree with above. Paroxysmal atrial fibrillation, converted after IV diltiazem. Placed him on metoprolol twice a day. Xarelto. Outpatient echocardiogram. OK for discharge. Close follow-up.  Candee Furbish, MD

## 2015-07-03 LAB — HEMOGLOBIN A1C
Hgb A1c MFr Bld: 9 % — ABNORMAL HIGH (ref 4.8–5.6)
MEAN PLASMA GLUCOSE: 212 mg/dL

## 2015-07-05 ENCOUNTER — Telehealth: Payer: Self-pay | Admitting: Cardiology

## 2015-07-05 NOTE — Telephone Encounter (Signed)
New Message  Rn from [facility] stated that pt has continued to take amlodipine and hydrochlorothiazide since being discharged even though D/C instructions have him stopping- Rnstated that the pt was not told to stop these medications. [place] wanted to clarify meds for pt- can call listed number to speak w/ Shirlean Mylar about additional questions- but she stated Rn can just call Pt w/ med clarifications. Please call back and discuss.

## 2015-07-05 NOTE — Telephone Encounter (Signed)
Left message for pt on home VM to call back to review medications. Left message for Christopher Patel that according to the documentation from pt's d/c paperwork pt was to have stopped his HCTZ and Amlodipine.  He was given new Rxs for Valsartan and Metoprolol.  Requested she c/b with any further questions or concerns.

## 2015-07-06 NOTE — Telephone Encounter (Signed)
Left message for pt to call back b/c Shirlean Mylar had stated pt needed clarification r/t medication

## 2015-07-08 NOTE — Telephone Encounter (Signed)
Spoke with Robin.  She is aware of the medications pt is supposed to be taking and what he was supposed to have stopped.  She has left him a message as well but he has not returned the phone.  She will attempt to contact him again.  He does have an appt with them 6/22.

## 2015-07-08 NOTE — Telephone Encounter (Signed)
F/u  Pt returning Rn phone call. Please call back and discuss.   

## 2015-07-15 DIAGNOSIS — Z7984 Long term (current) use of oral hypoglycemic drugs: Secondary | ICD-10-CM | POA: Diagnosis not present

## 2015-07-15 DIAGNOSIS — I10 Essential (primary) hypertension: Secondary | ICD-10-CM | POA: Diagnosis not present

## 2015-07-15 DIAGNOSIS — Z79899 Other long term (current) drug therapy: Secondary | ICD-10-CM | POA: Diagnosis not present

## 2015-07-15 DIAGNOSIS — Z794 Long term (current) use of insulin: Secondary | ICD-10-CM | POA: Diagnosis not present

## 2015-07-15 DIAGNOSIS — I480 Paroxysmal atrial fibrillation: Secondary | ICD-10-CM | POA: Diagnosis not present

## 2015-07-15 DIAGNOSIS — E1165 Type 2 diabetes mellitus with hyperglycemia: Secondary | ICD-10-CM | POA: Diagnosis not present

## 2015-07-19 ENCOUNTER — Encounter: Payer: Commercial Managed Care - HMO | Admitting: Physician Assistant

## 2015-07-20 ENCOUNTER — Other Ambulatory Visit: Payer: Self-pay

## 2015-07-20 ENCOUNTER — Ambulatory Visit (HOSPITAL_COMMUNITY): Payer: Commercial Managed Care - HMO | Attending: Cardiology

## 2015-07-20 DIAGNOSIS — E785 Hyperlipidemia, unspecified: Secondary | ICD-10-CM | POA: Diagnosis not present

## 2015-07-20 DIAGNOSIS — I119 Hypertensive heart disease without heart failure: Secondary | ICD-10-CM | POA: Insufficient documentation

## 2015-07-20 DIAGNOSIS — I480 Paroxysmal atrial fibrillation: Secondary | ICD-10-CM

## 2015-07-20 DIAGNOSIS — E119 Type 2 diabetes mellitus without complications: Secondary | ICD-10-CM | POA: Diagnosis not present

## 2015-07-20 DIAGNOSIS — I4891 Unspecified atrial fibrillation: Secondary | ICD-10-CM | POA: Diagnosis present

## 2015-07-20 DIAGNOSIS — I313 Pericardial effusion (noninflammatory): Secondary | ICD-10-CM | POA: Diagnosis not present

## 2015-07-20 LAB — ECHOCARDIOGRAM COMPLETE
Ao-asc: 31 cm
CHL CUP MV DEC (S): 229
E decel time: 229 msec
E/e' ratio: 18.04
FS: 29 % (ref 28–44)
IVS/LV PW RATIO, ED: 1.2
LA ID, A-P, ES: 42 mm
LA diam end sys: 42 mm
LA vol: 69 mL
LADIAMINDEX: 1.91 cm/m2
LAVOLA4C: 76 mL
LAVOLIN: 31.5 mL/m2
LV TDI E'LATERAL: 5.36
LV TDI E'MEDIAL: 5.95
LVEEAVG: 18.04
LVEEMED: 18.04
LVELAT: 5.36 cm/s
LVOT VTI: 25 cm
LVOT area: 2.54 cm2
LVOT peak grad rest: 8 mmHg
LVOT peak vel: 137 cm/s
LVOTD: 18 mm
LVOTSV: 64 mL
Lateral S' vel: 16.6 cm/s
MV pk A vel: 111 m/s
MV pk E vel: 96.7 m/s
MVPG: 4 mmHg
PW: 12.1 mm — AB (ref 0.6–1.1)

## 2015-07-21 ENCOUNTER — Telehealth: Payer: Self-pay | Admitting: Physician Assistant

## 2015-07-21 NOTE — Telephone Encounter (Signed)
F/u  Pt returningphone call. Please call back and discuss.   

## 2015-07-21 NOTE — Telephone Encounter (Signed)
Returned pts call.  He has been made aware of his eligibility to work out, lightly.

## 2015-07-26 ENCOUNTER — Ambulatory Visit (INDEPENDENT_AMBULATORY_CARE_PROVIDER_SITE_OTHER): Payer: Commercial Managed Care - HMO | Admitting: Physician Assistant

## 2015-07-26 ENCOUNTER — Encounter: Payer: Self-pay | Admitting: Physician Assistant

## 2015-07-26 VITALS — BP 130/85 | HR 71 | Ht 70.0 in | Wt 212.0 lb

## 2015-07-26 DIAGNOSIS — I2510 Atherosclerotic heart disease of native coronary artery without angina pectoris: Secondary | ICD-10-CM | POA: Diagnosis not present

## 2015-07-26 DIAGNOSIS — I2583 Coronary atherosclerosis due to lipid rich plaque: Secondary | ICD-10-CM

## 2015-07-26 DIAGNOSIS — E785 Hyperlipidemia, unspecified: Secondary | ICD-10-CM

## 2015-07-26 DIAGNOSIS — I480 Paroxysmal atrial fibrillation: Secondary | ICD-10-CM | POA: Diagnosis not present

## 2015-07-26 DIAGNOSIS — I10 Essential (primary) hypertension: Secondary | ICD-10-CM

## 2015-07-26 MED ORDER — VALSARTAN 320 MG PO TABS: 320.0000 mg | ORAL_TABLET | Freq: Every day | ORAL | Status: DC

## 2015-07-26 NOTE — Patient Instructions (Signed)
Medication Instructions:  Your physician has recommended you make the following change in your medication:  INCREASE Diovan to 320mg  daily  Labwork: Cbc today  Testing/Procedures: None ordered  Follow-Up: Your physician wants you to follow-up in: 3 months with Dr.Skains You will receive a reminder letter in the mail two months in advance. If you don't receive a letter, please call our office to schedule the follow-up appointment.   Any Other Special Instructions Will Be Listed Below (If Applicable).     If you need a refill on your cardiac medications before your next appointment, please call your pharmacy.

## 2015-07-26 NOTE — Progress Notes (Addendum)
Cardiology Office Note   Date:  07/26/2015   ID:  Christopher Patel, DOB 17-Jun-1945, MRN [id number]  PCP:  Lujean Amel, MD  Cardiologist:   Dr. Marlou Porch  Chief Complaint  Patient presents with  . Hospitalization Follow-up    seen for Dr. Marlou Porch      History of Present Illness: Christopher Patel is a 70 y.o. male who presents for post hospital follow-up. He has past medical history of HTN, HLD, DM, CAD based on CT and h/o thyroid disease s/p thyroidectomy. He has no prior history of cardiac catheterization. He did have stress test 3 years ago that was negative, however I am unable to locate such record in the system. He has a CT of the chest without contrast in January 2013 that showed atherosclerosis including left main and two-vessel CAD. He has occasional palpitation which are relieved by rest. He was in his usual state of health until he was admitted on 07/02/2015 with new atrial fibrillation with RVR. He was placed on diltiazem drip, shortly after arrived on cardiology telemetry floor, he converted to normal sinus rhythm. Since he was maintaining sinus rhythm, he was deemed stable for discharge on Xarelto. IV diltiazem was positioned to metoprolol tartrate 25 mg twice a day. His HCTZ was stopped. Given new addition of blood pressure medication, his valsartan was cut in half.  He presents today for outpatient cardiology follow-up. He has been doing well since discharge. He has not experienced any palpitations symptom. He does however have increased of bleeding while working in the yard. I have asked him to continue Xarelto as this time. His blood pressure is mildly elevated today with systolic blood pressure 0000000. He says sometimes in the morning prior to morning medication, his blood pressure is in the 150s. I will ask him to increase his Valsartan back to 320 mg daily.   Past Medical History  Diagnosis Date  . Hyperlipidemia   . CAD (coronary artery disease)   . Depression   . ED (erectile  dysfunction)   . Lung nodule   . Hepatic lesion     left lobe  . Tubular adenoma     polyps- Dr.Edwards  . Pneumonia   . Nasal congestion   . Change in voice   . Cough   . Thyroid disease     thyroid lesion  . Diabetes mellitus 04-25-11    oral meds  . Osteoarthritis 04-25-11    spine area-some neck issues  . Hypothyroidism   . PONV (postoperative nausea and vomiting)   . Hypertension     dr Marilynn Rail  . Paroxysmal atrial fibrillation (HCC)     started on metoprolol and Xarelto 07/02/2015 for afib RVR    Past Surgical History  Procedure Laterality Date  . Knee surgery  04-25-11    right/ left knee scope  . Eye surgery  11/2010    cataract x2  . Eye surgery  12/2010    len implant x2   . Thyroidectomy  05/02/2011    Procedure: THYROIDECTOMY;  Surgeon: Earnstine Regal, MD;  Location: [facility];  Service: General;  Laterality: N/A;  Total Thyroidectomy  . Cardiac catheterization  04-25-11    many yrs ago-very slight blockage 1 vessel (60% RCA by Dr. Dorthy Cooler notes)  . Total knee arthroplasty Right 04/25/2013    Procedure: TOTAL KNEE ARTHROPLASTY;  Surgeon: Kerin Salen, MD;  Location: [facility];  Service: Orthopedics;  Laterality: Right;  Current Outpatient Prescriptions  Medication Sig Dispense Refill  . acetaminophen (TYLENOL) 500 MG tablet Take 500 mg by mouth every 6 (six) hours as needed for moderate pain (BACK PAIN).    Marland Kitchen atorvastatin (LIPITOR) 80 MG tablet Take 40 mg by mouth at bedtime.    . fexofenadine (ALLEGRA) 180 MG tablet Take 180 mg by mouth daily as needed. Allergies     . fluticasone (VERAMYST) 27.5 MCG/SPRAY nasal spray Place 2 sprays into the nose daily as needed for allergies.     Marland Kitchen glimepiride (AMARYL) 4 MG tablet Take 8 mg by mouth daily with breakfast.    . levothyroxine (SYNTHROID, LEVOTHROID) 150 MCG tablet Take 150 mcg by mouth daily before breakfast.    . metFORMIN (GLUCOPHAGE-XR) 500 MG 24 hr tablet Take 1,000 mg by mouth 2 (two) times daily.    .  metoprolol tartrate (LOPRESSOR) 25 MG tablet Take 1 tablet (25 mg total) by mouth 2 (two) times daily. 180 tablet 3  . rivaroxaban (XARELTO) 20 MG TABS tablet Take 1 tablet (20 mg total) by mouth daily with supper. 90 tablet 3  . valsartan (DIOVAN) 320 MG tablet Take 1 tablet (320 mg total) by mouth daily.     No current facility-administered medications for this visit.    Allergies:   Actos; Glyburide; Metformin and related; and Codeine    Social History:  The patient  reports that he quit smoking about 5 years ago. He has never used smokeless tobacco. He reports that he drinks alcohol. He reports that he does not use illicit drugs.   Family History:  The patient's family history includes COPD in his other; Cancer in his brother; Heart attack in his other; Hyperlipidemia in his other; Hypertension in his other.    ROS:  Please see the history of present illness.   Otherwise, review of systems are positive for none.   All other systems are reviewed and negative.    PHYSICAL EXAM: VS:  BP 130/85 mmHg  Pulse 71  Ht 5\' 10"  (1.778 m)  Wt 212 lb (96.163 kg)  BMI 30.42 kg/m2 , BMI Body mass index is 30.42 kg/(m^2). GEN: Well nourished, well developed, in no acute distress HEENT: normal Neck: no JVD, carotid bruits, or masses Cardiac: RRR; no murmurs, rubs, or gallops,no edema  Respiratory:  clear to auscultation bilaterally, normal work of breathing GI: soft, nontender, nondistended, + BS MS: no deformity or atrophy Skin: warm and dry, no rash Neuro:  Strength and sensation are intact Psych: euthymic mood, full affect   EKG:  EKG is ordered today. The ekg ordered today demonstrates NSR without significant ST-T wave changes, no recurrent afib   Recent Labs: 07/02/2015: BUN 8; Creatinine, Ser 0.93; Hemoglobin 14.5; Platelets 217; Potassium 3.7; Sodium 137; TSH 0.439    Lipid Panel No results found for: CHOL, TRIG, HDL, CHOLHDL, VLDL, LDLCALC, LDLDIRECT    Wt Readings from Last 3  Encounters:  07/26/15 212 lb (96.163 kg)  07/02/15 210 lb 1.6 oz (95.3 kg)  12/22/14 212 lb 6 oz (96.333 kg)      Other studies Reviewed: Additional studies/ records that were reviewed today include:    Echo 07/20/2015 LV EF: 55% - 60%  ------------------------------------------------------------------- Indications: (I48.0).  ------------------------------------------------------------------- History: PMH: Acquired from the patient and from the patient&'s chart. Atrial fibrillation. Risk factors: Hypertension. Diabetes mellitus. Dyslipidemia.  ------------------------------------------------------------------- Study Conclusions  - Left ventricle: The cavity size was normal. There was mild  concentric hypertrophy. Systolic function was normal. The  estimated ejection fraction was in the range of 55% to 60%. Wall  motion was normal; there were no regional wall motion  abnormalities. Doppler parameters are consistent with abnormal  left ventricular relaxation (grade 1 diastolic dysfunction).  Doppler parameters are consistent with high ventricular filling  pressure. - Left atrium: The atrium was mildly dilated. - Pericardium, extracardiac: A trivial pericardial effusion was  identified posterior to the heart. There was no evidence of  hemodynamic compromise.   CT wo contrast 02/14/2011 3. Atherosclerosis, including left main and two-vessel coronary artery disease. Please note that although the presence of coronary artery calcium documents the presence of coronary artery disease, the severity of this disease and any potential stenosis cannot be assessed on this non-gated CT examination.   Review of the above records demonstrates:   Patient has hypertension, hyperlipidemia and diabetes and significant family history of CAD who present to the hospital recently with new atrial fibrillation with RVR. He self converted on the floor on IV diltiazem, and  was then discharged on PO metoprolol.    ASSESSMENT AND PLAN:  1.  PAF on Xarelto  - This patients CHA2DS2-VASc Score and unadjusted Ischemic Stroke Rate (% per year) is equal to 4.8 % stroke rate/year from a score of 4  Above score calculated as 1 point each if present [CHF, HTN, DM, Vascular=MI/PAD/Aortic Plaque, Age if 65-74, or Male] Above score calculated as 2 points each if present [Age > 75, or Stroke/TIA/TE]  - rate controlled on metoprolol. Obtain CBC given addition of Xarelto.  2. HTN: SBP 130, valsartan previously cut in half prior to recent discharge due to addition of new metoprolol, his BP has been slightly high, will increase valsartan back to 320 mg daily.   3. HLD: continue lipitor  4. DM: on insulin and glimepiride  5. CAD on previous CT of chest: no angina, although he does have significant FHx of CAD, he is fairly active and has been exercising in the [place] with any exertional symptom    Current medicines are reviewed at length with the patient today.  The patient does not have concerns regarding medicines.  The following changes have been made:  Increase valsartan back to 320mg  daily  Labs/ tests ordered today include:   Orders Placed This Encounter  Procedures  . CBC w/Diff  . EKG 12-Lead     Disposition:   FU with Dr. Marlou Porch in 3 months  Signed, Almyra Deforest, [place]  07/26/2015 10:48 PM    [address], [place], [place]  [address] Phone: [phone]; Fax: [phone]

## 2015-07-27 LAB — CBC WITH DIFFERENTIAL/PLATELET
BASOS ABS: 0 {cells}/uL (ref 0–200)
Basophils Relative: 0 %
EOS ABS: 0 {cells}/uL — AB (ref 15–500)
EOS PCT: 0 %
HCT: 41.3 % (ref 38.5–50.0)
HEMOGLOBIN: 13.9 g/dL (ref 13.2–17.1)
LYMPHS ABS: 2205 {cells}/uL (ref 850–3900)
Lymphocytes Relative: 35 %
MCH: 29.3 pg (ref 27.0–33.0)
MCHC: 33.7 g/dL (ref 32.0–36.0)
MCV: 86.9 fL (ref 80.0–100.0)
MONOS PCT: 12 %
MPV: 10.4 fL (ref 7.5–12.5)
Monocytes Absolute: 756 cells/uL (ref 200–950)
NEUTROS ABS: 3339 {cells}/uL (ref 1500–7800)
NEUTROS PCT: 53 %
Platelets: 192 10*3/uL (ref 140–400)
RBC: 4.75 MIL/uL (ref 4.20–5.80)
RDW: 14.5 % (ref 11.0–15.0)
WBC: 6.3 10*3/uL (ref 3.8–10.8)

## 2015-08-10 ENCOUNTER — Telehealth: Payer: Self-pay | Admitting: Cardiology

## 2015-08-10 DIAGNOSIS — I480 Paroxysmal atrial fibrillation: Secondary | ICD-10-CM | POA: Diagnosis not present

## 2015-08-10 DIAGNOSIS — Z7984 Long term (current) use of oral hypoglycemic drugs: Secondary | ICD-10-CM | POA: Diagnosis not present

## 2015-08-10 DIAGNOSIS — E1165 Type 2 diabetes mellitus with hyperglycemia: Secondary | ICD-10-CM | POA: Diagnosis not present

## 2015-08-10 DIAGNOSIS — Z794 Long term (current) use of insulin: Secondary | ICD-10-CM | POA: Diagnosis not present

## 2015-08-10 DIAGNOSIS — I10 Essential (primary) hypertension: Secondary | ICD-10-CM | POA: Diagnosis not present

## 2015-08-10 DIAGNOSIS — E785 Hyperlipidemia, unspecified: Secondary | ICD-10-CM | POA: Diagnosis not present

## 2015-08-10 DIAGNOSIS — I2510 Atherosclerotic heart disease of native coronary artery without angina pectoris: Secondary | ICD-10-CM | POA: Diagnosis not present

## 2015-08-10 DIAGNOSIS — E039 Hypothyroidism, unspecified: Secondary | ICD-10-CM | POA: Diagnosis not present

## 2015-08-10 NOTE — Telephone Encounter (Signed)
Left message to call back Amlodipine was d/c'ed at hospital stay on 07/02/15.

## 2015-08-10 NOTE — Telephone Encounter (Signed)
Spoke with Shirlean Mylar at [facility] and advised I'm not sure exactly why Amlodipine was stopped.  Also advised I'm not sure if it will be restarted. Informed Shirlean Mylar that at 7/3 appt pt's Diovan was increased to 320mg . Pt was to have BP check at PCP's office and did now show in June.  Pt did come in at a later date with home BP cuff and they checked BP manually and compared with home cuff. This occurred prior to change in Diovan. Advised Shirlean Mylar that pt needs to monitor BP and let us know how it is running so we can adjust meds or add Amlodipine back if needed. Shirlean Mylar states that she will call pt and discuss this with him.  Advised she could call back or have pt call if any questions or concerns.

## 2015-08-10 NOTE — Telephone Encounter (Signed)
New message      Nurse from [facility] is calling to she why amlodipine was stopped and will he be restart medication?  Pt is confused and nurse at [facility] is trying to help him

## 2015-08-17 ENCOUNTER — Encounter (HOSPITAL_COMMUNITY): Payer: Self-pay

## 2015-08-17 ENCOUNTER — Telehealth: Payer: Self-pay | Admitting: Cardiology

## 2015-08-17 NOTE — Telephone Encounter (Signed)
08/17/2015 2:19 PM [facility] Triage Patient Calls    Comment: New message   Pt is calling for rn pt verbalized that he wants rn and Dr.Skains to be aware that last night he was outside and mesquitos bit him many times in different places and he was bleeding a lot please call pt      Pt was outside yesterday working in the yard and was bit my mosquitos.  Pt states that he had 4 places on his legs that bled for about 5 mins but have been fine since.  Pt inquired about cutting back on Xarelto to help decrease bleeding for when this happens.  Educated pt on importance of taking Xarelto as prescribed.  Pt was wearing shorts when he was bit.  Advised if he can stand it, he can wear pants or use some kind of repellent to try to avoid getting bit.  Pt verbalized understanding and was in agreement with this plan.

## 2015-09-16 DIAGNOSIS — E039 Hypothyroidism, unspecified: Secondary | ICD-10-CM | POA: Diagnosis not present

## 2015-09-16 DIAGNOSIS — I480 Paroxysmal atrial fibrillation: Secondary | ICD-10-CM | POA: Diagnosis not present

## 2015-09-16 DIAGNOSIS — E1165 Type 2 diabetes mellitus with hyperglycemia: Secondary | ICD-10-CM | POA: Diagnosis not present

## 2015-09-16 DIAGNOSIS — Z23 Encounter for immunization: Secondary | ICD-10-CM | POA: Diagnosis not present

## 2015-09-16 DIAGNOSIS — I2510 Atherosclerotic heart disease of native coronary artery without angina pectoris: Secondary | ICD-10-CM | POA: Diagnosis not present

## 2015-09-16 DIAGNOSIS — Z0001 Encounter for general adult medical examination with abnormal findings: Secondary | ICD-10-CM | POA: Diagnosis not present

## 2015-09-16 DIAGNOSIS — G4700 Insomnia, unspecified: Secondary | ICD-10-CM | POA: Diagnosis not present

## 2015-09-16 DIAGNOSIS — I10 Essential (primary) hypertension: Secondary | ICD-10-CM | POA: Diagnosis not present

## 2015-09-16 DIAGNOSIS — Z79899 Other long term (current) drug therapy: Secondary | ICD-10-CM | POA: Diagnosis not present

## 2015-11-17 ENCOUNTER — Telehealth: Payer: Self-pay | Admitting: Cardiology

## 2015-11-17 DIAGNOSIS — I10 Essential (primary) hypertension: Secondary | ICD-10-CM | POA: Diagnosis not present

## 2015-11-17 DIAGNOSIS — I480 Paroxysmal atrial fibrillation: Secondary | ICD-10-CM | POA: Diagnosis not present

## 2015-11-17 DIAGNOSIS — Z794 Long term (current) use of insulin: Secondary | ICD-10-CM | POA: Diagnosis not present

## 2015-11-17 DIAGNOSIS — E785 Hyperlipidemia, unspecified: Secondary | ICD-10-CM | POA: Diagnosis not present

## 2015-11-17 DIAGNOSIS — I2510 Atherosclerotic heart disease of native coronary artery without angina pectoris: Secondary | ICD-10-CM | POA: Diagnosis not present

## 2015-11-17 DIAGNOSIS — E039 Hypothyroidism, unspecified: Secondary | ICD-10-CM | POA: Diagnosis not present

## 2015-11-17 DIAGNOSIS — E1165 Type 2 diabetes mellitus with hyperglycemia: Secondary | ICD-10-CM | POA: Diagnosis not present

## 2015-11-17 NOTE — Telephone Encounter (Signed)
Spoke with pt and advised him that replacing Xarelto with ASA is not appropriate.  Pt states Xarelto was going to cost him $460 now.  Advised Coumadin was a cheaper option.  Pt refuses Coumadin as he says his brother took Coumadin and he died from it.  Pt has about a month worth of pills remaining at home.  Advised pt I was going to place samples at the front and to come by and pick those up.  Pt verbalized understanding and was appreciative for assistance.   Called Dr. Versie Starks office and updated them that we were providing samples.  Robin appreciative for assistance.

## 2015-11-17 NOTE — Telephone Encounter (Signed)
New message      Pt cannot afford xarelto.  Pt has about 2wk of medication left. He told his PCP that he was going to stop taking xarelto and just take an aspirin.  PCP is calling to have [place] call him and see if there is something else cheaper he can take.  Please call pt at home

## 2015-12-20 ENCOUNTER — Other Ambulatory Visit: Payer: Self-pay | Admitting: Cardiology

## 2015-12-20 DIAGNOSIS — I2510 Atherosclerotic heart disease of native coronary artery without angina pectoris: Secondary | ICD-10-CM | POA: Diagnosis not present

## 2015-12-20 DIAGNOSIS — I480 Paroxysmal atrial fibrillation: Secondary | ICD-10-CM | POA: Diagnosis not present

## 2015-12-20 DIAGNOSIS — E1165 Type 2 diabetes mellitus with hyperglycemia: Secondary | ICD-10-CM | POA: Diagnosis not present

## 2015-12-20 DIAGNOSIS — I10 Essential (primary) hypertension: Secondary | ICD-10-CM | POA: Diagnosis not present

## 2015-12-20 DIAGNOSIS — E785 Hyperlipidemia, unspecified: Secondary | ICD-10-CM | POA: Diagnosis not present

## 2015-12-20 DIAGNOSIS — E039 Hypothyroidism, unspecified: Secondary | ICD-10-CM | POA: Diagnosis not present

## 2015-12-20 NOTE — Telephone Encounter (Signed)
Telephone   11/17/2015 [place], MD  Cardiology   Conversation  (Newest Message First)  Loren Racer, LPN  to Shellia Cleverly, RN       11/17/15 11:30 AM  FYI  Loren Racer, LPN      [id number] AM  Note    Spoke with pt and advised him that replacing Xarelto with ASA is not appropriate.  Pt states Xarelto was going to cost him $460 now.  Advised Coumadin was a cheaper option.  Pt refuses Coumadin as he says his brother took Coumadin and he died from it.  Pt has about a month worth of pills remaining at home.  Advised pt I was going to place samples at the front and to come by and pick those up.  Pt verbalized understanding and was appreciative for assistance.   Called Dr. Versie Starks office and updated them that we were providing samples.  Robin appreciative for assistance.       Samples still up front, LVM informing pt of this.

## 2015-12-20 NOTE — Telephone Encounter (Signed)
Patient calling the office for samples of medication:   1.  What medication and dosage are you requesting samples for?Xarelto 2.  Are you currently out of this medication? Yes,he would like enough until the end of the year.Pt is in the donut hole.

## 2016-02-24 ENCOUNTER — Telehealth: Payer: Self-pay | Admitting: Cardiology

## 2016-02-24 NOTE — Telephone Encounter (Signed)
LM TO CALL BACK ./CY 

## 2016-02-24 NOTE — Telephone Encounter (Signed)
New message     Pt c/o medication issue:  1. Name of Medication: xarelto  2. How are you currently taking this medication (dosage and times per day)? Once daily  3. Are you having a reaction (difficulty breathing--STAT)? No  4. What is your medication issue? Insurance isn't going to cover, will cost pt $400. Pt can not afford medication.   Pt states he was given samples of xarelto and has about a week supply left. He states he wants to go back to taking a baby aspirin.

## 2016-02-25 NOTE — Telephone Encounter (Signed)
Follow Up     Returning your call about medication

## 2016-02-25 NOTE — Telephone Encounter (Signed)
Reviewed pt's insurance that is on file and looked up his formulary online.  Xarelto and Eliquis are both Tier 3 and would cost the patient the same amt (which he can not afford).  Advised pt again that he can be changed to Coumadin however he does not which to do this because of the copay to come in for bloodwork.  He says he will look into other options but he really doesn't feel like he needs this medication because he "hasn't been in at fib."

## 2016-02-26 NOTE — Telephone Encounter (Signed)
Last ECG was NSR, seen by [place], [place].  If AFIB returns, he is at increased risk for stroke without anticoagulation. Please let us know if palpitations or sensation of AFIB return.  Thanks for update.   Candee Furbish, MD

## 2016-02-29 DIAGNOSIS — E7800 Pure hypercholesterolemia, unspecified: Secondary | ICD-10-CM | POA: Diagnosis not present

## 2016-02-29 DIAGNOSIS — E1165 Type 2 diabetes mellitus with hyperglycemia: Secondary | ICD-10-CM | POA: Diagnosis not present

## 2016-02-29 DIAGNOSIS — I10 Essential (primary) hypertension: Secondary | ICD-10-CM | POA: Diagnosis not present

## 2016-02-29 DIAGNOSIS — I480 Paroxysmal atrial fibrillation: Secondary | ICD-10-CM | POA: Diagnosis not present

## 2016-02-29 DIAGNOSIS — Z79899 Other long term (current) drug therapy: Secondary | ICD-10-CM | POA: Diagnosis not present

## 2016-02-29 DIAGNOSIS — G4700 Insomnia, unspecified: Secondary | ICD-10-CM | POA: Diagnosis not present

## 2016-02-29 DIAGNOSIS — E039 Hypothyroidism, unspecified: Secondary | ICD-10-CM | POA: Diagnosis not present

## 2016-03-20 DIAGNOSIS — I480 Paroxysmal atrial fibrillation: Secondary | ICD-10-CM | POA: Diagnosis not present

## 2016-03-20 DIAGNOSIS — E1165 Type 2 diabetes mellitus with hyperglycemia: Secondary | ICD-10-CM | POA: Diagnosis not present

## 2016-03-20 DIAGNOSIS — E785 Hyperlipidemia, unspecified: Secondary | ICD-10-CM | POA: Diagnosis not present

## 2016-03-20 DIAGNOSIS — Z794 Long term (current) use of insulin: Secondary | ICD-10-CM | POA: Diagnosis not present

## 2016-03-20 DIAGNOSIS — I2510 Atherosclerotic heart disease of native coronary artery without angina pectoris: Secondary | ICD-10-CM | POA: Diagnosis not present

## 2016-03-20 DIAGNOSIS — E039 Hypothyroidism, unspecified: Secondary | ICD-10-CM | POA: Diagnosis not present

## 2016-03-20 DIAGNOSIS — I10 Essential (primary) hypertension: Secondary | ICD-10-CM | POA: Diagnosis not present

## 2016-03-21 DIAGNOSIS — E1165 Type 2 diabetes mellitus with hyperglycemia: Secondary | ICD-10-CM | POA: Diagnosis not present

## 2016-03-21 DIAGNOSIS — I480 Paroxysmal atrial fibrillation: Secondary | ICD-10-CM | POA: Diagnosis not present

## 2016-03-21 DIAGNOSIS — Z794 Long term (current) use of insulin: Secondary | ICD-10-CM | POA: Diagnosis not present

## 2016-03-21 DIAGNOSIS — I10 Essential (primary) hypertension: Secondary | ICD-10-CM | POA: Diagnosis not present

## 2016-03-31 ENCOUNTER — Telehealth: Payer: Self-pay | Admitting: Cardiology

## 2016-03-31 MED ORDER — RIVAROXABAN 20 MG PO TABS: 20.0000 mg | ORAL_TABLET | Freq: Every day | ORAL | 1 refills | Status: DC

## 2016-03-31 NOTE — Telephone Encounter (Signed)
New Message   *STAT* If patient is at the pharmacy, call can be transferred to refill team.   1. Which medications need to be refilled? (please list name of each medication and dose if known) Xarelto 20mg   2. Which pharmacy/location (including street and city if local pharmacy) is medication to be sent to? [facility] in   3. Do they need a 30 day or 90 day supply? 90  Per pt would like to speak with someone in refill. Please call back to discuss

## 2016-04-04 ENCOUNTER — Encounter: Payer: Self-pay | Admitting: Endocrinology

## 2016-04-04 ENCOUNTER — Ambulatory Visit (INDEPENDENT_AMBULATORY_CARE_PROVIDER_SITE_OTHER): Payer: Commercial Managed Care - HMO | Admitting: Endocrinology

## 2016-04-04 VITALS — BP 140/86 | HR 98 | Ht 70.0 in | Wt 219.0 lb

## 2016-04-04 DIAGNOSIS — E119 Type 2 diabetes mellitus without complications: Secondary | ICD-10-CM | POA: Diagnosis not present

## 2016-04-04 DIAGNOSIS — Z794 Long term (current) use of insulin: Secondary | ICD-10-CM

## 2016-04-04 MED ORDER — INSULIN DETEMIR 100 UNIT/ML FLEXPEN: 50.0000 [IU] | PEN_INJECTOR | SUBCUTANEOUS | 11 refills | Status: DC

## 2016-04-04 NOTE — Patient Instructions (Addendum)
good diet and exercise significantly improve the control of your diabetes.  please let me know if you wish to be referred to a dietician.  high blood sugar is very risky to your health.  you should see an eye doctor and dentist every year.  It is very important to get all recommended vaccinations.  Controlling your blood pressure and cholesterol drastically reduces the damage diabetes does to your body.  Those who smoke should quit.  Please discuss these with your doctor.  check your blood sugar twice a day.  vary the time of day when you check, between before the 3 meals, and at bedtime.  also check if you have symptoms of your blood sugar being too high or too low.  please keep a record of the readings and bring it to your next appointment here (or you can bring the meter itself).  You can write it on any piece of paper.  please call us sooner if your blood sugar goes below 70, or if you have a lot of readings over 200.   For now, please: Change the levemir to 50 units each morning, and: Stop taking the glimepiride, and: Please continue the same metformin. Please call us in a few days, to tell us how the blood sugar is doing.   We will need to take this complex situation in stages.   Please come back for a follow-up appointment in 2 weeks.

## 2016-04-04 NOTE — Progress Notes (Signed)
Subjective:    Patient ID: Christopher Patel, male    DOB: [birth year], 71 y.o.   MRN: [id number]  HPI pt is referred by Dr Dorthy Cooler, for diabetes.  Pt states DM was dx'ed in 2012; he has mild if any neuropathy of the lower extremities; he is unaware of any associated chronic complications; he has been on insulin since mid-2017; pt says his diet and exercise are good; he has never had pancreatitis, pancreatic surgery, severe hypoglycemia or DKA.  He takes levemir 43 units qhs.  He also takes 2 oral meds.  He says cbg's vary from 120-180.  He says he has a limited ability to afford name brand meds.   Past Medical History:  Diagnosis Date  . CAD (coronary artery disease)   . Change in voice   . Cough   . Depression   . Diabetes mellitus 04-25-11   oral meds  . ED (erectile dysfunction)   . Hepatic lesion    left lobe  . Hyperlipidemia   . Hypertension    dr Marilynn Rail  . Hypothyroidism   . Lung nodule   . Nasal congestion   . Osteoarthritis 04-25-11   spine area-some neck issues  . Paroxysmal atrial fibrillation (HCC)    started on metoprolol and Xarelto 07/02/2015 for afib RVR  . Pneumonia   . PONV (postoperative nausea and vomiting)   . Thyroid disease    thyroid lesion  . Tubular adenoma    polyps- Dr.Edwards    Past Surgical History:  Procedure Laterality Date  . CARDIAC CATHETERIZATION  04-25-11   many yrs ago-very slight blockage 1 vessel (60% RCA by Dr. Dorthy Cooler notes)  . EYE SURGERY  11/2010   cataract x2  . EYE SURGERY  12/2010   len implant x2   . KNEE SURGERY  04-25-11   right/ left knee scope  . THYROIDECTOMY  05/02/2011   Procedure: THYROIDECTOMY;  Surgeon: Earnstine Regal, MD;  Location: [facility];  Service: General;  Laterality: N/A;  Total Thyroidectomy  . TOTAL KNEE ARTHROPLASTY Right 04/25/2013   Procedure: TOTAL KNEE ARTHROPLASTY;  Surgeon: Kerin Salen, MD;  Location: [facility];  Service: Orthopedics;  Laterality: Right;    Social History   Social History  . Marital  status: Widowed    Spouse name: N/A  . Number of children: N/A  . Years of education: N/A   Occupational History  . Not on file.   Social History Main Topics  . Smoking status: Former Smoker    Years: 50.00    Quit date: 11/24/2009  . Smokeless tobacco: Never Used  . Alcohol use Yes     Comment: Beer on weekends, 6 or more  . Drug use: No  . Sexual activity: No   Other Topics Concern  . Not on file   Social History Narrative  . No narrative on file    Current Outpatient Prescriptions on File Prior to Visit  Medication Sig Dispense Refill  . acetaminophen (TYLENOL) 500 MG tablet Take 500 mg by mouth every 6 (six) hours as needed for moderate pain (BACK PAIN).    Marland Kitchen atorvastatin (LIPITOR) 80 MG tablet Take 40 mg by mouth at bedtime.    Marland Kitchen levothyroxine (SYNTHROID, LEVOTHROID) 150 MCG tablet Take 150 mcg by mouth daily before breakfast.    . metFORMIN (GLUCOPHAGE-XR) 500 MG 24 hr tablet Take 1,000 mg by mouth 2 (two) times daily.    . metoprolol tartrate (LOPRESSOR) 25 MG  tablet Take 1 tablet (25 mg total) by mouth 2 (two) times daily. 180 tablet 3  . rivaroxaban (XARELTO) 20 MG TABS tablet Take 1 tablet (20 mg total) by mouth daily with supper. 90 tablet 1  . valsartan (DIOVAN) 320 MG tablet Take 1 tablet (320 mg total) by mouth daily.    . fexofenadine (ALLEGRA) 180 MG tablet Take 180 mg by mouth daily as needed. Allergies     . fluticasone (VERAMYST) 27.5 MCG/SPRAY nasal spray Place 2 sprays into the nose daily as needed for allergies.      No current facility-administered medications on file prior to visit.     Allergies  Allergen Reactions  . Actos [Pioglitazone Hydrochloride] Nausea Only  . Glyburide Other (See Comments)    hypoglycemia  . Metformin And Related Other (See Comments)    Hypoglycemia, shakes  . Codeine Itching    Family History  Problem Relation Age of Onset  . Diabetes Mother   . Cancer Brother     colon  . Diabetes Brother   . COPD Other   .  Hypertension Other   . Hyperlipidemia Other   . Heart attack Other   . Diabetes Sister    BP 140/86   Pulse 98   Ht 5\' 10"  (1.778 m)   Wt 219 lb (99.3 kg)   SpO2 92%   BMI 31.42 kg/m   Review of Systems denies blurry vision, headache, chest pain, sob, n/v, urinary frequency, muscle cramps, excessive diaphoresis, memory loss, and cold intolerance.  He has weight gain, rhinorrhea, and easy bruising.     Objective:   Physical Exam VS: see vs page GEN: no distress HEAD: head: no deformity eyes: no periorbital swelling, no proptosis external nose and ears are normal mouth: no lesion seen NECK: supple, thyroid is not enlarged CHEST WALL: no deformity.   LUNGS: clear to auscultation CV: reg rate and rhythm, no murmur ABD: abdomen is soft, nontender.  no hepatosplenomegaly.  not distended.  no hernia MUSCULOSKELETAL: muscle bulk and strength are grossly normal.  no obvious joint swelling.  gait is normal and steady EXTEMITIES: no deformity.  no ulcer on the feet.  feet are of normal color and temp.  1+ bilat leg edema PULSES: dorsalis pedis intact bilat.  no carotid bruit NEURO:  cn 2-12 grossly intact.   readily moves all 4's.  sensation is intact to touch on the feet SKIN:  Normal texture and temperature.  No rash or suspicious lesion is visible.   NODES:  None palpable at the neck.   PSYCH: alert, well-oriented.  Does not appear anxious nor depressed.   outside test results are reviewed: A1c=9.4%  I have reviewed outside records, and summarized: Pt was noted to have elevated a1c, and referred here.  Addition of another med was considered, but he cannot afford.  Diet was noted to be poor.       Assessment & Plan:  Insulin-requiring type 2 DM, new to me: I advised multiple daily injections, but he declines, at least for now.  He wants to use up 10 levemir pens before change to human insulin. When he uses this up, we'll plan to change to human insulin.  Patient is advised the  following: Patient Instructions  good diet and exercise significantly improve the control of your diabetes.  please let me know if you wish to be referred to a dietician.  high blood sugar is very risky to your health.  you should see an eye  doctor and dentist every year.  It is very important to get all recommended vaccinations.  Controlling your blood pressure and cholesterol drastically reduces the damage diabetes does to your body.  Those who smoke should quit.  Please discuss these with your doctor.  check your blood sugar twice a day.  vary the time of day when you check, between before the 3 meals, and at bedtime.  also check if you have symptoms of your blood sugar being too high or too low.  please keep a record of the readings and bring it to your next appointment here (or you can bring the meter itself).  You can write it on any piece of paper.  please call us sooner if your blood sugar goes below 70, or if you have a lot of readings over 200.   For now, please: Change the levemir to 50 units each morning, and: Stop taking the glimepiride, and: Please continue the same metformin. Please call us in a few days, to tell us how the blood sugar is doing.   We will need to take this complex situation in stages.   Please come back for a follow-up appointment in 2 weeks.

## 2016-04-06 ENCOUNTER — Telehealth: Payer: Self-pay | Admitting: Physician Assistant

## 2016-04-06 DIAGNOSIS — E039 Hypothyroidism, unspecified: Secondary | ICD-10-CM | POA: Diagnosis not present

## 2016-04-06 DIAGNOSIS — E785 Hyperlipidemia, unspecified: Secondary | ICD-10-CM | POA: Diagnosis not present

## 2016-04-06 DIAGNOSIS — E1165 Type 2 diabetes mellitus with hyperglycemia: Secondary | ICD-10-CM | POA: Diagnosis not present

## 2016-04-06 DIAGNOSIS — Z794 Long term (current) use of insulin: Secondary | ICD-10-CM | POA: Diagnosis not present

## 2016-04-06 DIAGNOSIS — I10 Essential (primary) hypertension: Secondary | ICD-10-CM | POA: Diagnosis not present

## 2016-04-06 DIAGNOSIS — I2510 Atherosclerotic heart disease of native coronary artery without angina pectoris: Secondary | ICD-10-CM | POA: Diagnosis not present

## 2016-04-06 DIAGNOSIS — I480 Paroxysmal atrial fibrillation: Secondary | ICD-10-CM | POA: Diagnosis not present

## 2016-04-06 NOTE — Telephone Encounter (Signed)
Please call,question about whether he needs to be taking his Xarelto.

## 2016-04-06 NOTE — Telephone Encounter (Addendum)
Spoke with pt, he told his medical doctor that he had not taken xarelto for over one month until recently he has restarted. His medical doctor was concerned and told the patient to call us because he may need a starter pack. The patient had not been taking the xarelto because of being in the donut hole and not being able to afford it. He is now out of the hole and can afford it. Advised patient to continue on 20 mg once daily, no starter pack needed. Follow up scheduled for patient to see dr Marlou Porch. Patient voiced understanding not to run out of medication without talking to [place].

## 2016-04-10 ENCOUNTER — Telehealth: Payer: Self-pay | Admitting: Endocrinology

## 2016-04-10 NOTE — Telephone Encounter (Signed)
Patient ask you to give him call about his b/s reading

## 2016-04-10 NOTE — Telephone Encounter (Signed)
Patient is returning your call.  

## 2016-04-10 NOTE — Telephone Encounter (Signed)
I contacted the patient and advised of message via voicemail. Requested a call back if the patient would like to discuss further.  

## 2016-04-10 NOTE — Telephone Encounter (Signed)
Patient called to report blood sugar readings.  04/06/2016:  Fasting:218  2 hrs after eating lunch:188 3 /16/2018:  Fasting: 174  2 hrs after eating lunch:190 04/08/2016:  Fasting: 184  2 hrs after eating lunch: 177  04/09/2016:  Fasting: 192  2 hrs after eating lunch:184  Patient confirmed he is taking 50 units of the levermir daily. Patient stated his blood sugars have been going up since discontinuing the glimepiride.

## 2016-04-10 NOTE — Telephone Encounter (Signed)
Please increase the levemir to 60 units each morning. I'll see you next time.

## 2016-04-10 NOTE — Telephone Encounter (Signed)
Patient notified of new instructions. He voiced understanding and had no further questions.

## 2016-04-20 ENCOUNTER — Encounter: Payer: Self-pay | Admitting: *Deleted

## 2016-04-26 NOTE — Telephone Encounter (Signed)
Patient notified of message. He voiced understanding and had no further questions at this time.

## 2016-04-26 NOTE — Telephone Encounter (Signed)
BS readings  4/4 0830 -150 fasting 4/3 1230 - 171; 9pm 136 4/2 1300 - 150; 730 pm 175 4/1 1330 - 168; 800 pm 119 3/30 - 1230 190 after having pizza the night before;  155

## 2016-04-26 NOTE — Telephone Encounter (Signed)
Dr. Cruzita Lederer, Could you review the number during Dr. Cordelia Pen absence. The patient is currently taking 60 units of levemir daily. Thanks!

## 2016-04-26 NOTE — Telephone Encounter (Signed)
I would suggest to continue current regimen for now.  Eat a lower fat diet: no pizza (!), limit (ideally eliminate) meat and cheese, icecream or other concentrated sweets. Use mostly veggies, fruit, nuts. Dinner should be the smallest meals of the day.  Call back with CBGs in 1 week.

## 2016-05-02 ENCOUNTER — Ambulatory Visit (INDEPENDENT_AMBULATORY_CARE_PROVIDER_SITE_OTHER): Payer: Medicare HMO | Admitting: Endocrinology

## 2016-05-02 VITALS — BP 144/82 | HR 73 | Ht 70.0 in | Wt 218.0 lb

## 2016-05-02 DIAGNOSIS — E119 Type 2 diabetes mellitus without complications: Secondary | ICD-10-CM

## 2016-05-02 DIAGNOSIS — Z794 Long term (current) use of insulin: Secondary | ICD-10-CM | POA: Diagnosis not present

## 2016-05-02 MED ORDER — INSULIN DETEMIR 100 UNIT/ML FLEXPEN: 65.0000 [IU] | PEN_INJECTOR | SUBCUTANEOUS | 11 refills | Status: DC

## 2016-05-02 NOTE — Progress Notes (Signed)
Subjective:    Patient ID: Christopher Patel, male    DOB: August 01, 1945, 71 y.o.   MRN: [id number]  HPI  Pt returns for f/u of diabetes mellitus: DM type: Insulin-requiring type 2 Dx'ed: 5465 Complications: none Therapy: insulin since 2017 DKA: never Severe hypoglycemia: never Pancreatitis: never Other: he says he has a limited ability to afford name brand meds; he declines multiple daily injections.   Interval history: he wants to try getting insulin from the [place].  He brings a record of his cbg's which I have reviewed today. It varies from 125-171.  There is no trend throughout the day. He takes 60 units each morning.  pt states he feels well in general.   Past Medical History:  Diagnosis Date  . CAD (coronary artery disease)   . Change in voice   . Cough   . Depression   . Diabetes mellitus 04-25-11   oral meds  . ED (erectile dysfunction)   . Hepatic lesion    left lobe  . Hyperlipidemia   . Hypertension    dr Marilynn Rail  . Hypothyroidism   . Lung nodule   . Nasal congestion   . Osteoarthritis 04-25-11   spine area-some neck issues  . Paroxysmal atrial fibrillation (HCC)    started on metoprolol and Xarelto 07/02/2015 for afib RVR  . Pneumonia   . PONV (postoperative nausea and vomiting)   . Thyroid disease    thyroid lesion  . Tubular adenoma    polyps- Dr.Edwards    Past Surgical History:  Procedure Laterality Date  . CARDIAC CATHETERIZATION  04-25-11   many yrs ago-very slight blockage 1 vessel (60% RCA by Dr. Dorthy Cooler notes)  . EYE SURGERY  11/2010   cataract x2  . EYE SURGERY  12/2010   len implant x2   . KNEE SURGERY  04-25-11   right/ left knee scope  . THYROIDECTOMY  05/02/2011   Procedure: THYROIDECTOMY;  Surgeon: Earnstine Regal, MD;  Location: [facility];  Service: General;  Laterality: N/A;  Total Thyroidectomy  . TOTAL KNEE ARTHROPLASTY Right 04/25/2013   Procedure: TOTAL KNEE ARTHROPLASTY;  Surgeon: Kerin Salen, MD;  Location: [place];  Service: Orthopedics;   Laterality: Right;    Social History   Social History  . Marital status: Widowed    Spouse name: N/A  . Number of children: N/A  . Years of education: N/A   Occupational History  . Not on file.   Social History Main Topics  . Smoking status: Former Smoker    Years: 50.00    Quit date: 11/24/2009  . Smokeless tobacco: Never Used  . Alcohol use Yes     Comment: Beer on weekends, 6 or more  . Drug use: No  . Sexual activity: No   Other Topics Concern  . Not on file   Social History Narrative  . No narrative on file    Current Outpatient Prescriptions on File Prior to Visit  Medication Sig Dispense Refill  . acetaminophen (TYLENOL) 500 MG tablet Take 500 mg by mouth every 6 (six) hours as needed for moderate pain (BACK PAIN).    Marland Kitchen amLODipine (NORVASC) 10 MG tablet Take 10 mg by mouth daily.    Marland Kitchen atorvastatin (LIPITOR) 80 MG tablet Take 40 mg by mouth at bedtime.    . fexofenadine (ALLEGRA) 180 MG tablet Take 180 mg by mouth daily as needed. Allergies     . fluticasone (VERAMYST) 27.5 MCG/SPRAY nasal  spray Place 2 sprays into the nose daily as needed for allergies.     . hydrochlorothiazide (HYDRODIURIL) 25 MG tablet Take 25 mg by mouth daily.    Marland Kitchen levothyroxine (SYNTHROID, LEVOTHROID) 150 MCG tablet Take 150 mcg by mouth daily before breakfast.    . metFORMIN (GLUCOPHAGE-XR) 500 MG 24 hr tablet Take 1,000 mg by mouth 2 (two) times daily.    . metoprolol tartrate (LOPRESSOR) 25 MG tablet Take 1 tablet (25 mg total) by mouth 2 (two) times daily. 180 tablet 3  . pantoprazole (PROTONIX) 40 MG tablet Take 40 mg by mouth daily.    . rivaroxaban (XARELTO) 20 MG TABS tablet Take 1 tablet (20 mg total) by mouth daily with supper. 90 tablet 1  . valsartan (DIOVAN) 320 MG tablet Take 1 tablet (320 mg total) by mouth daily.     No current facility-administered medications on file prior to visit.     Allergies  Allergen Reactions  . Actos [Pioglitazone Hydrochloride] Nausea Only  .  Glyburide Other (See Comments)    hypoglycemia  . Metformin And Related Other (See Comments)    Hypoglycemia, shakes  . Codeine Itching    Family History  Problem Relation Age of Onset  . Diabetes Mother   . Diabetes Brother   . Colon cancer Brother   . COPD Other   . Hypertension Other   . Hyperlipidemia Other   . Heart attack Other   . Diabetes Sister    BP (!) 144/82   Pulse 73   Ht 5\' 10"  (1.778 m)   Wt 218 lb (98.9 kg)   SpO2 95%   BMI 31.28 kg/m   Review of Systems He denies hypoglycemia.     Objective:   Physical Exam VITAL SIGNS:  See vs page.  GENERAL: no distress.  Pulses: dorsalis pedis intact bilat.   MSK: no deformity of the feet. CV: trace bilat leg edema.   Skin:  no ulcer on the feet.  normal color and temp on the feet.   Neuro: sensation is intact to touch on the feet.        Assessment & Plan:  Insulin-requiring type 2 DM: he needs increased rx.    Patient Instructions  check your blood sugar twice a day.  vary the time of day when you check, between before the 3 meals, and at bedtime.  also check if you have symptoms of your blood sugar being too high or too low.  please keep a record of the readings and bring it to your next appointment here (or you can bring the meter itself).  You can write it on any piece of paper.  please call us sooner if your blood sugar goes below 70, or if you have a lot of readings over 200.   For now, please: increase the levemir to 65 units each morning, and:  Please continue the same metformin. It is fine with me for you to get your insulin from the [place].   Please come back for a follow-up appointment in 1 month.

## 2016-05-02 NOTE — Patient Instructions (Addendum)
check your blood sugar twice a day.  vary the time of day when you check, between before the 3 meals, and at bedtime.  also check if you have symptoms of your blood sugar being too high or too low.  please keep a record of the readings and bring it to your next appointment here (or you can bring the meter itself).  You can write it on any piece of paper.  please call us sooner if your blood sugar goes below 70, or if you have a lot of readings over 200.   For now, please: increase the levemir to 65 units each morning, and:  Please continue the same metformin. It is fine with me for you to get your insulin from the [place].   Please come back for a follow-up appointment in 1 month.

## 2016-05-05 ENCOUNTER — Ambulatory Visit (INDEPENDENT_AMBULATORY_CARE_PROVIDER_SITE_OTHER): Payer: Medicare HMO | Admitting: Cardiology

## 2016-05-05 ENCOUNTER — Encounter: Payer: Self-pay | Admitting: Cardiology

## 2016-05-05 ENCOUNTER — Encounter (INDEPENDENT_AMBULATORY_CARE_PROVIDER_SITE_OTHER): Payer: Self-pay

## 2016-05-05 VITALS — BP 136/76 | HR 80 | Ht 70.0 in | Wt 219.2 lb

## 2016-05-05 DIAGNOSIS — I2583 Coronary atherosclerosis due to lipid rich plaque: Secondary | ICD-10-CM

## 2016-05-05 DIAGNOSIS — I480 Paroxysmal atrial fibrillation: Secondary | ICD-10-CM | POA: Diagnosis not present

## 2016-05-05 DIAGNOSIS — I10 Essential (primary) hypertension: Secondary | ICD-10-CM

## 2016-05-05 DIAGNOSIS — I2510 Atherosclerotic heart disease of native coronary artery without angina pectoris: Secondary | ICD-10-CM

## 2016-05-05 DIAGNOSIS — E785 Hyperlipidemia, unspecified: Secondary | ICD-10-CM | POA: Diagnosis not present

## 2016-05-05 NOTE — Patient Instructions (Signed)
Medication Instructions:  You may stop your Xarelto. Continue all other medications as listed.  Follow-Up: Follow up as needed with Dr Marlou Porch  Thank you for choosing [facility]!!

## 2016-05-05 NOTE — Progress Notes (Signed)
Cardiology Office Note    Date:  05/05/2016   ID:  Christopher Patel, DOB [birth year], MRN [id number]  PCP:  Lujean Amel, MD  Cardiologist:   Candee Furbish, MD     History of Present Illness:  Christopher Patel is a 71 y.o. male with prior history of coronary artery calcification, diabetes with hypertension with paroxysmal atrial fibrillation noted on 07/02/15, rapid ventricular response. Diltiazem drip-converted. Sinus rhythm. Xarelto.  He is not having any chest pain, no further palpitations, no further atrial fibrillation. He was highly symptomatic with his prior episode of atrial fibrillation. He does admit that he was likely provoked by heavy alcohol use.  Unfortunately, he is unable to afford his medications. Please see below for details.  No strokelike symptoms.  He is going to try to go to the [facility] to help with his medications.   Past Medical History:  Diagnosis Date  . CAD (coronary artery disease)   . Change in voice   . Cough   . Depression   . Diabetes mellitus 04-25-11   oral meds  . ED (erectile dysfunction)   . Hepatic lesion    left lobe  . Hyperlipidemia   . Hypertension    dr Marilynn Rail  . Hypothyroidism   . Lung nodule   . Nasal congestion   . Osteoarthritis 04-25-11   spine area-some neck issues  . Paroxysmal atrial fibrillation (HCC)    started on metoprolol and Xarelto 07/02/2015 for afib RVR  . Pneumonia   . PONV (postoperative nausea and vomiting)   . Thyroid disease    thyroid lesion  . Tubular adenoma    polyps- Dr.Edwards    Past Surgical History:  Procedure Laterality Date  . CARDIAC CATHETERIZATION  04-25-11   many yrs ago-very slight blockage 1 vessel (60% RCA by Dr. Dorthy Cooler notes)  . EYE SURGERY  11/2010   cataract x2  . EYE SURGERY  12/2010   len implant x2   . KNEE SURGERY  04-25-11   right/ left knee scope  . THYROIDECTOMY  05/02/2011   Procedure: THYROIDECTOMY;  Surgeon: Earnstine Regal, MD;  Location: [facility];  Service: General;   Laterality: N/A;  Total Thyroidectomy  . TOTAL KNEE ARTHROPLASTY Right 04/25/2013   Procedure: TOTAL KNEE ARTHROPLASTY;  Surgeon: Kerin Salen, MD;  Location: [place];  Service: Orthopedics;  Laterality: Right;    Current Medications: Outpatient Medications Prior to Visit  Medication Sig Dispense Refill  . acetaminophen (TYLENOL) 500 MG tablet Take 500 mg by mouth every 6 (six) hours as needed for moderate pain (BACK PAIN).    Marland Kitchen amLODipine (NORVASC) 10 MG tablet Take 10 mg by mouth daily.    Marland Kitchen atorvastatin (LIPITOR) 80 MG tablet Take 40 mg by mouth at bedtime.    . fexofenadine (ALLEGRA) 180 MG tablet Take 180 mg by mouth daily as needed. Allergies     . fluticasone (VERAMYST) 27.5 MCG/SPRAY nasal spray Place 2 sprays into the nose daily as needed for allergies.     . hydrochlorothiazide (HYDRODIURIL) 25 MG tablet Take 25 mg by mouth daily.    . Insulin Detemir (LEVEMIR FLEXTOUCH) 100 UNIT/ML Pen Inject 65 Units into the skin every morning. 30 mL 11  . levothyroxine (SYNTHROID, LEVOTHROID) 150 MCG tablet Take 150 mcg by mouth daily before breakfast.    . metFORMIN (GLUCOPHAGE-XR) 500 MG 24 hr tablet Take 1,000 mg by mouth 2 (two) times daily.    . metoprolol tartrate (  LOPRESSOR) 25 MG tablet Take 1 tablet (25 mg total) by mouth 2 (two) times daily. 180 tablet 3  . pantoprazole (PROTONIX) 40 MG tablet Take 40 mg by mouth daily.    . valsartan (DIOVAN) 320 MG tablet Take 1 tablet (320 mg total) by mouth daily.    . rivaroxaban (XARELTO) 20 MG TABS tablet Take 1 tablet (20 mg total) by mouth daily with supper. 90 tablet 1   No facility-administered medications prior to visit.      Allergies:   Actos [pioglitazone hydrochloride]; Glyburide; Metformin and related; and Codeine   Social History   Social History  . Marital status: Widowed    Spouse name: N/A  . Number of children: N/A  . Years of education: N/A   Social History Main Topics  . Smoking status: Former Smoker    Years: 50.00      Quit date: 11/24/2009  . Smokeless tobacco: Never Used  . Alcohol use Yes     Comment: Beer on weekends, 6 or more  . Drug use: No  . Sexual activity: No   Other Topics Concern  . None   Social History Narrative  . None     Family History:  The patient's family history includes COPD in his other; Colon cancer in his brother; Diabetes in his brother, mother, and sister; Heart attack in his other; Hyperlipidemia in his other; Hypertension in his other.   ROS:   Please see the history of present illness.    ROS All other systems reviewed and are negative.   PHYSICAL EXAM:   VS:  BP 136/76   Pulse 80   Ht 5\' 10"  (1.778 m)   Wt 219 lb 4 oz (99.5 kg)   SpO2 93%   BMI 31.46 kg/m    GEN: Well nourished, well developed, in no acute distress  HEENT: normal  Neck: no JVD, carotid bruits, or masses Cardiac: RRR; no murmurs, rubs, or gallops,no edema  Respiratory:  clear to auscultation bilaterally, normal work of breathing GI: soft, nontender, nondistended, + BS, obese MS: no deformity or atrophy  Skin: warm and dry, no rash Neuro:  Alert and Oriented x 3, Strength and sensation are intact Psych: euthymic mood, full affect  Wt Readings from Last 3 Encounters:  05/05/16 219 lb 4 oz (99.5 kg)  05/02/16 218 lb (98.9 kg)  04/04/16 219 lb (99.3 kg)      Studies/Labs Reviewed:   EKG:  Prior EKG sinus rhythm personally viewed  Recent Labs: 07/02/2015: BUN 8; Creatinine, Ser 0.93; Potassium 3.7; Sodium 137; TSH 0.439 07/26/2015: Hemoglobin 13.9; Platelets 192   Lipid Panel No results found for: CHOL, TRIG, HDL, CHOLHDL, VLDL, LDLCALC, LDLDIRECT  Additional studies/ records that were reviewed today include:  Echocardiogram-normal EF A prior office notes, lab work reviewed    ASSESSMENT:    1. Paroxysmal atrial fibrillation (HCC)   2. Dyslipidemia   3. Coronary atherosclerosis due to lipid rich plaque   4. Essential hypertension, benign      PLAN:  In order of  problems listed above:  Paroxysmal atrial fibrillation  - He thinks that it may of been from "partying a little too much "with his sons that were in town, i.e. heavy alcohol use. Probably a potential reversible cause. He has not had any further atrial fibrillation since that singular episode. His EF is normal.  He states that both his insulin and his Xarelto together or $1400 when he is in the donut hole. He  needs to buy groceries he states. He was close to taking out a second mortgage on his house so that he did not pay the high interest rates on credit card to afford his medications. I understand this financial dilemma. In a perfect world, we would be able to continue this medication just in case atrial fibrillation returns however he is adamant that he is unable to afford it. He also knows that we have a $45 co-pay here in clinic for Coumadin checks in this is unreasonable as well. Because of this, we are stopping the Xarelto. He was symptomatic previously with his atrial fibrillation so hopefully if it returns he will let us know and at that time we will have to make a decision on whether we use Xarelto or potentially Coumadin. My preference is to use the Xarelto for ease of use. He will let us know. He understands that without the Xarelto if atrial fibrillation returns he is at increased risk for stroke.   Essential hypertension  - Per Dr. Dorthy Cooler  - Continue current medications.  Diabetes with hypertension  - Dr. Loanne Drilling has been helping with this.  Coronary calcium on CT  - Continue prevention. Activity.    Medication Adjustments/Labs and Tests Ordered: Current medicines are reviewed at length with the patient today.  Concerns regarding medicines are outlined above.  Medication changes, Labs and Tests ordered today are listed in the Patient Instructions below. Patient Instructions  Medication Instructions:  You may stop your Xarelto. Continue all other medications as  listed.  Follow-Up: Follow up as needed with Dr Marlou Porch  Thank you for choosing [facility]!!        Signed, Candee Furbish, MD  05/05/2016 4:12 PM    [facility] [address], [place], [place]  [address] Phone: [phone]; Fax: [phone]

## 2016-05-22 DIAGNOSIS — I480 Paroxysmal atrial fibrillation: Secondary | ICD-10-CM | POA: Diagnosis not present

## 2016-05-22 DIAGNOSIS — I2510 Atherosclerotic heart disease of native coronary artery without angina pectoris: Secondary | ICD-10-CM | POA: Diagnosis not present

## 2016-05-22 DIAGNOSIS — I10 Essential (primary) hypertension: Secondary | ICD-10-CM | POA: Diagnosis not present

## 2016-05-22 DIAGNOSIS — E039 Hypothyroidism, unspecified: Secondary | ICD-10-CM | POA: Diagnosis not present

## 2016-05-22 DIAGNOSIS — Z794 Long term (current) use of insulin: Secondary | ICD-10-CM | POA: Diagnosis not present

## 2016-05-22 DIAGNOSIS — E785 Hyperlipidemia, unspecified: Secondary | ICD-10-CM | POA: Diagnosis not present

## 2016-05-22 DIAGNOSIS — E1165 Type 2 diabetes mellitus with hyperglycemia: Secondary | ICD-10-CM | POA: Diagnosis not present

## 2016-06-01 ENCOUNTER — Ambulatory Visit (INDEPENDENT_AMBULATORY_CARE_PROVIDER_SITE_OTHER): Payer: Medicare HMO | Admitting: Endocrinology

## 2016-06-01 ENCOUNTER — Encounter: Payer: Self-pay | Admitting: Endocrinology

## 2016-06-01 VITALS — BP 138/82 | HR 61 | Ht 70.0 in | Wt 219.0 lb

## 2016-06-01 DIAGNOSIS — E119 Type 2 diabetes mellitus without complications: Secondary | ICD-10-CM | POA: Diagnosis not present

## 2016-06-01 DIAGNOSIS — Z794 Long term (current) use of insulin: Secondary | ICD-10-CM

## 2016-06-01 LAB — POCT GLYCOSYLATED HEMOGLOBIN (HGB A1C): HEMOGLOBIN A1C: 8

## 2016-06-01 MED ORDER — INSULIN DETEMIR 100 UNIT/ML FLEXPEN: 70.0000 [IU] | PEN_INJECTOR | SUBCUTANEOUS | 11 refills | Status: DC

## 2016-06-01 NOTE — Patient Instructions (Addendum)
check your blood sugar twice a day.  vary the time of day when you check, between before the 3 meals, and at bedtime.  also check if you have symptoms of your blood sugar being too high or too low.  please keep a record of the readings and bring it to your next appointment here (or you can bring the meter itself).  You can write it on any piece of paper.  please call us sooner if your blood sugar goes below 70, or if you have a lot of readings over 200.   Please increase the levemir to 70 units each morning, and:  Please continue the same metformin. It is fine with me for you to get your insulin from the [place].   Please come back for a follow-up appointment in 2 months.

## 2016-06-01 NOTE — Progress Notes (Signed)
Subjective:    Patient ID: Christopher Patel, male    DOB: [birth year], 71 y.o.   MRN: [id number]  HPI Pt returns for f/u of diabetes mellitus: DM type: Insulin-requiring type 2 Dx'ed: 2229 Complications: none Therapy: insulin since 2017 DKA: never.  Severe hypoglycemia: never.   Pancreatitis: never.  Other: he says he has a limited ability to afford name brand meds; he declines multiple daily injections.   Interval history: he brings a record of his cbg's which I have reviewed today. It varies from 138-200.  There is no trend throughout the day. He takes 65 units each morning.  pt states he feels well in general.   Past Medical History:  Diagnosis Date  . CAD (coronary artery disease)   . Change in voice   . Cough   . Depression   . Diabetes mellitus 04-25-11   oral meds  . ED (erectile dysfunction)   . Hepatic lesion    left lobe  . Hyperlipidemia   . Hypertension    dr Marilynn Rail  . Hypothyroidism   . Lung nodule   . Nasal congestion   . Osteoarthritis 04-25-11   spine area-some neck issues  . Paroxysmal atrial fibrillation (HCC)    started on metoprolol and Xarelto 07/02/2015 for afib RVR  . Pneumonia   . PONV (postoperative nausea and vomiting)   . Thyroid disease    thyroid lesion  . Tubular adenoma    polyps- Dr.Edwards    Past Surgical History:  Procedure Laterality Date  . CARDIAC CATHETERIZATION  04-25-11   many yrs ago-very slight blockage 1 vessel (60% RCA by Dr. Dorthy Cooler notes)  . EYE SURGERY  11/2010   cataract x2  . EYE SURGERY  12/2010   len implant x2   . KNEE SURGERY  04-25-11   right/ left knee scope  . THYROIDECTOMY  05/02/2011   Procedure: THYROIDECTOMY;  Surgeon: Earnstine Regal, MD;  Location: [facility];  Service: General;  Laterality: N/A;  Total Thyroidectomy  . TOTAL KNEE ARTHROPLASTY Right 04/25/2013   Procedure: TOTAL KNEE ARTHROPLASTY;  Surgeon: Kerin Salen, MD;  Location: [place];  Service: Orthopedics;  Laterality: Right;    Social History     Social History  . Marital status: Widowed    Spouse name: N/A  . Number of children: N/A  . Years of education: N/A   Occupational History  . Not on file.   Social History Main Topics  . Smoking status: Former Smoker    Years: 50.00    Quit date: 11/24/2009  . Smokeless tobacco: Never Used  . Alcohol use Yes     Comment: Beer on weekends, 6 or more  . Drug use: No  . Sexual activity: No   Other Topics Concern  . Not on file   Social History Narrative  . No narrative on file    Current Outpatient Prescriptions on File Prior to Visit  Medication Sig Dispense Refill  . acetaminophen (TYLENOL) 500 MG tablet Take 500 mg by mouth every 6 (six) hours as needed for moderate pain (BACK PAIN).    Marland Kitchen amLODipine (NORVASC) 10 MG tablet Take 10 mg by mouth daily.    Marland Kitchen atorvastatin (LIPITOR) 80 MG tablet Take 40 mg by mouth at bedtime.    . fexofenadine (ALLEGRA) 180 MG tablet Take 180 mg by mouth daily as needed. Allergies     . fluticasone (VERAMYST) 27.5 MCG/SPRAY nasal spray Place 2 sprays into the  nose daily as needed for allergies.     . hydrochlorothiazide (HYDRODIURIL) 25 MG tablet Take 25 mg by mouth daily.    Marland Kitchen levothyroxine (SYNTHROID, LEVOTHROID) 150 MCG tablet Take 150 mcg by mouth daily before breakfast.    . metFORMIN (GLUCOPHAGE-XR) 500 MG 24 hr tablet Take 1,000 mg by mouth 2 (two) times daily.    . metoprolol tartrate (LOPRESSOR) 25 MG tablet Take 1 tablet (25 mg total) by mouth 2 (two) times daily. 180 tablet 3  . pantoprazole (PROTONIX) 40 MG tablet Take 40 mg by mouth daily.    . valsartan (DIOVAN) 320 MG tablet Take 1 tablet (320 mg total) by mouth daily.     No current facility-administered medications on file prior to visit.     Allergies  Allergen Reactions  . Actos [Pioglitazone Hydrochloride] Nausea Only  . Glyburide Other (See Comments)    hypoglycemia  . Metformin And Related Other (See Comments)    Hypoglycemia, shakes  . Codeine Itching     Family History  Problem Relation Age of Onset  . Diabetes Mother   . Diabetes Brother   . Colon cancer Brother   . COPD Other   . Hypertension Other   . Hyperlipidemia Other   . Heart attack Other   . Diabetes Sister     BP 138/82   Pulse 61   Ht 5\' 10"  (1.778 m)   Wt 219 lb (99.3 kg)   SpO2 94%   BMI 31.42 kg/m    Review of Systems He denies hypoglycemia.    Objective:   Physical Exam VITAL SIGNS:  See vs page.  GENERAL: no distress.  Pulses: dorsalis pedis intact bilat.   MSK: no deformity of the feet. CV: trace bilat leg edema.   Skin:  no ulcer on the feet.  normal color and temp on the feet.   Neuro: sensation is intact to touch on the feet.   Lab Results  Component Value Date   HGBA1C 8.0 06/01/2016      Assessment & Plan:  Insulin-requiring type 2 DM: he needs increased rx.   Patient Instructions  check your blood sugar twice a day.  vary the time of day when you check, between before the 3 meals, and at bedtime.  also check if you have symptoms of your blood sugar being too high or too low.  please keep a record of the readings and bring it to your next appointment here (or you can bring the meter itself).  You can write it on any piece of paper.  please call us sooner if your blood sugar goes below 70, or if you have a lot of readings over 200.   Please increase the levemir to 70 units each morning, and:  Please continue the same metformin. It is fine with me for you to get your insulin from the [place].   Please come back for a follow-up appointment in 2 months.

## 2016-06-12 ENCOUNTER — Telehealth: Payer: Self-pay | Admitting: Endocrinology

## 2016-06-12 DIAGNOSIS — E039 Hypothyroidism, unspecified: Secondary | ICD-10-CM | POA: Diagnosis not present

## 2016-06-12 DIAGNOSIS — Z794 Long term (current) use of insulin: Secondary | ICD-10-CM | POA: Diagnosis not present

## 2016-06-12 DIAGNOSIS — I2510 Atherosclerotic heart disease of native coronary artery without angina pectoris: Secondary | ICD-10-CM | POA: Diagnosis not present

## 2016-06-12 DIAGNOSIS — E1165 Type 2 diabetes mellitus with hyperglycemia: Secondary | ICD-10-CM | POA: Diagnosis not present

## 2016-06-12 DIAGNOSIS — I480 Paroxysmal atrial fibrillation: Secondary | ICD-10-CM | POA: Diagnosis not present

## 2016-06-12 DIAGNOSIS — I10 Essential (primary) hypertension: Secondary | ICD-10-CM | POA: Diagnosis not present

## 2016-06-12 DIAGNOSIS — E785 Hyperlipidemia, unspecified: Secondary | ICD-10-CM | POA: Diagnosis not present

## 2016-06-12 NOTE — Telephone Encounter (Signed)
Patient called to advise that the Insulin Detemir (LEVEMIR FLEXTOUCH) 100 UNIT/ML Pen is too expensive. Are there any alternatives or samples? Patient has some suggestions as well. Please call patient to advise.

## 2016-06-12 NOTE — Telephone Encounter (Signed)
I can change to [facility].  In order to get cheaper, you should buy from [place], and draw from the bottle.

## 2016-06-14 MED ORDER — INSULIN NPH (HUMAN) (ISOPHANE) 100 UNIT/ML ~~LOC~~ SUSP: 40.0000 [IU] | SUBCUTANEOUS | 11 refills | Status: DC

## 2016-06-14 NOTE — Telephone Encounter (Signed)
Patient states that he is fine with changing to NPH. He said that he cannot afford the levemir. Can this be called in to [place]. Pt still has a 10 day supply of levimer.

## 2016-06-14 NOTE — Telephone Encounter (Signed)
Please change the levemir NPH, 50 units each morning, and:  Please continue the same metformin. This is a guess as to how much NPH you would need, because it is not a unit-for-unit conversion. Please call us next week, to tell us how the blood sugar is doing

## 2016-06-15 MED ORDER — INSULIN NPH (HUMAN) (ISOPHANE) 100 UNIT/ML ~~LOC~~ SUSP: 50.0000 [IU] | SUBCUTANEOUS | 11 refills | Status: DC

## 2016-06-15 MED ORDER — "INSULIN SYRINGE-NEEDLE U-100 30G X 1/2"" 1 ML MISC": 2 refills | Status: DC

## 2016-06-15 NOTE — Telephone Encounter (Signed)
Patient decided that he would like NPH to be sent to  [place], [place] - [place] [address] [phone] (Phone) [phone] (Fax)   Please advise patient once this has been done.

## 2016-06-15 NOTE — Telephone Encounter (Signed)
Spoke with the patient and he stated he has gone into the doughnut hole with his insurance and wanted to know if it would be cheaper to get the [facility] instead- I advised him to call [employer] and compare [facility] price and give me a call back to let me know what he wants to do- patient stated an understanding and said he would call back today with his decision

## 2016-06-15 NOTE — Telephone Encounter (Signed)
rx sent and patient notified- also requesting syringes to be sent to [place]

## 2016-06-29 ENCOUNTER — Other Ambulatory Visit: Payer: Self-pay | Admitting: *Deleted

## 2016-06-29 NOTE — Patient Outreach (Signed)
[facility]) Care Management  06/29/2016  Christopher Patel 01-12-1946 [id number]   Referral from MD office -Patient needs assistance with medication (insulin):  Telephone call to patient; left HIPPA compliant voice mail requesting call back.   Plan: Will follow up.  Sherrin Daisy, RN BSN [place] Management Coordinator [facility] Care Management  [phone]

## 2016-06-30 ENCOUNTER — Other Ambulatory Visit: Payer: Self-pay | Admitting: *Deleted

## 2016-06-30 DIAGNOSIS — E039 Hypothyroidism, unspecified: Secondary | ICD-10-CM | POA: Diagnosis not present

## 2016-06-30 DIAGNOSIS — I480 Paroxysmal atrial fibrillation: Secondary | ICD-10-CM | POA: Diagnosis not present

## 2016-06-30 DIAGNOSIS — I2510 Atherosclerotic heart disease of native coronary artery without angina pectoris: Secondary | ICD-10-CM | POA: Diagnosis not present

## 2016-06-30 DIAGNOSIS — I10 Essential (primary) hypertension: Secondary | ICD-10-CM | POA: Diagnosis not present

## 2016-06-30 DIAGNOSIS — E1165 Type 2 diabetes mellitus with hyperglycemia: Secondary | ICD-10-CM | POA: Diagnosis not present

## 2016-06-30 NOTE — Patient Outreach (Signed)
[facility]) Care Management  06/30/2016  Christopher Patel 1945/05/20 [id number]  Received referral from MD office; " patient needs medication assistance":  Primary Care provider-Dr. Dibas Koirala Endocrinologist- Dr. Renato Shin. Cardiologist- Dr. Marlou Porch  Telephone call x 2 to patient; who was advised of reason for call & [facility] care management services. HIPPA verification receive from patient.   Patient voices that major concern now is that he is in donut hole and will not be able to pay for needed medication (Levemir Insulin). Patient states he  currently has enough insulin pens for about 2-3 weeks.    Patient voices that he checks blood sugar twice daily and takes insulin dosage of 70 units daily (also on metformin). Voices that last A1c level was 8.0 in May 2018.  Voices that he has taken diabetes classes and has good working knowledge of self care as it relates to his diabetes. States he has appointment with Nutrition [facility] Diabetes Services next week.   Patient consents to [facility] care management services of Pharmacist. States will call later if he feels he needs services of Health Coach after he attends diabetes classes.   Plan: Send to care management assistant to assign to Pharmacist for medication assistance.  Telephonic signing off of case.       Sherrin Daisy, RN BSN [place] Management Coordinator [facility] Care Management  [phone]

## 2016-07-05 ENCOUNTER — Ambulatory Visit: Payer: Medicare HMO | Admitting: Registered"

## 2016-07-05 ENCOUNTER — Other Ambulatory Visit: Payer: Self-pay

## 2016-07-05 MED ORDER — INSULIN NPH (HUMAN) (ISOPHANE) 100 UNIT/ML ~~LOC~~ SUSP: SUBCUTANEOUS | 4 refills | Status: DC

## 2016-07-11 ENCOUNTER — Other Ambulatory Visit: Payer: Self-pay | Admitting: Pharmacist

## 2016-07-11 NOTE — Patient Outreach (Signed)
Christ[facility] Patel) Care Management  Christopher Patel   07/11/2016  Christopher Patel [birth year] [id number]  Subjective:  Patient left message returning [facility] CM Pharmacist phone call.  Returned call to patient, HIPAA details verified and patient consented to speak with [name] Pharmacist.   Patient states he is presently using Levemir 70 units daily.  He reports he has 4 insulin pens left.  He reports he is in the coverage gap on his Part D plan and that he has VA Benefits, but he reports medications were cheaper through [place] than [place].    Patient reports he is trying to get switched to Reli-On insulin at [facility] which he is aware is ~$25/vial, requires injection with syringe.  Patient reports [facility] told him there was something wrong with the way the prescriber sent Novolin N prescription.   Patient was willing to review medications over phone.    Objective:   Current Medications: Current Outpatient Prescriptions  Medication Sig Dispense Refill  . acetaminophen (TYLENOL) 500 MG tablet Take 500 mg by mouth every 6 (six) hours as needed for moderate pain (BACK PAIN).    Marland Kitchen amLODipine (NORVASC) 10 MG tablet Take 10 mg by mouth daily.    Marland Kitchen atorvastatin (LIPITOR) 80 MG tablet Take 40 mg by mouth at bedtime.    . hydrochlorothiazide (HYDRODIURIL) 25 MG tablet Take 25 mg by mouth daily.    Marland Kitchen levothyroxine (SYNTHROID, LEVOTHROID) 150 MCG tablet Take 150 mcg by mouth daily before breakfast.    . metFORMIN (GLUCOPHAGE-XR) 500 MG 24 hr tablet Take 1,000 mg by mouth 2 (two) times daily.    . metoprolol tartrate (LOPRESSOR) 25 MG tablet Take 1 tablet (25 mg total) by mouth 2 (two) times daily. 180 tablet 3  . pantoprazole (PROTONIX) 40 MG tablet Take 40 mg by mouth daily.    . valsartan (DIOVAN) 320 MG tablet Take 1 tablet (320 mg total) by mouth daily.    . fexofenadine (ALLEGRA) 180 MG tablet Take 180 mg by mouth daily as needed. Allergies     . fluticasone  (VERAMYST) 27.5 MCG/SPRAY nasal spray Place 2 sprays into the nose daily as needed for allergies.     Marland Kitchen insulin NPH Human (HUMULIN N) 100 UNIT/ML injection Inject 0.5 mLs (50 Units total) into the skin every morning. And syringes 1/day (Patient not taking: Reported on 07/11/2016) 40 mL 11  . insulin NPH Human (HUMULIN N,NOVOLIN N) 100 UNIT/ML injection Inject 30 units twice daily (Patient not taking: Reported on 07/11/2016) 10 mL 4  . Insulin Syringe-Needle U-100 (SAFETY INSULIN SYRINGES) 30G X 1/2" 1 ML MISC Use to inject insulin once daily 100 each 2   No current facility-administered medications for this visit.     Functional Status: In your present state of health, do you have any difficulty performing the following activities: 06/30/2016  Hearing? N  Vision? N  Difficulty concentrating or making decisions? N  Walking or climbing stairs? N  Dressing or bathing? N  Doing errands, shopping? N  Some recent data might be hidden    Fall/Depression Screening: Fall Risk  06/30/2016  Falls in the past year? No   PHQ 2/9 Scores 06/30/2016  PHQ - 2 Score 0    Assessment:  Medication review per patient report and medication list in this chart:  Drugs sorted by system:  Cardiovascular: -amlodipine -atorvastatin  -hydrochlorothiazide -metoprolol tartrate  -valsartan  Pulmonary/Allergy: -fexofenadine as needed -fluticasone nasale spray as needed   Gastrointestinal: -pantoprazole  Endocrine: -levothyroxine -metformin  Pain: -acetaminophen  Other issues noted:  -Patient reports he is still using Levemir.   Per review of medication list, it appears two prescriptions for Novolin N have been sent to [facility] with different directions.    Medication assistance: -Discussed [employer] Extra Help with patient---he reports income exceeds requirements.  -Discussed [place] patient assistance programs appear to exclude patient's with VA benefits per application  requirements.   -Suggested patient consider contacting [place] to see about getting insulin through [place] and he prefers to continue seeing his current doctors and using his current pharmacies---he understands VA benefits exclude him from manufacturer patient assistance programs.   -[facility] [address]---was told they don't have prescription for Novolin N at the pharmacy for patient.   Plan:  Will route note to PCP and will in-basket Dr Loanne Drilling regarding insulin.   Prescription for Reli-On Christopher Novolin N will need to be sent to [facility] if this is therapeutically appropriate to use for patient given cost of Levemir.   Will follow-up with patient---he may need to be taught how to use insulin syringe/vial per his report.   [name], PharmD, [place] [phone]

## 2016-07-11 NOTE — Patient Outreach (Signed)
[facility]) Care Management  07/11/2016  Christopher Patel Feb 24, 1945 [id number]  Patient was referred to [place] by [place], [name], for patient assistance evaluation for Levemir.    Unsuccessful phone outreach to patient, HIPAA compliant message left requesting return call from patient.   Plan:  If no return call from patient, will make another outreach attempt in the next week.    [name], PharmD, [place] [phone]

## 2016-07-12 ENCOUNTER — Telehealth: Payer: Self-pay

## 2016-07-12 ENCOUNTER — Other Ambulatory Visit: Payer: Self-pay | Admitting: Endocrinology

## 2016-07-12 MED ORDER — INSULIN NPH (HUMAN) (ISOPHANE) 100 UNIT/ML ~~LOC~~ SUSP: 60.0000 [IU] | SUBCUTANEOUS | 11 refills | Status: DC

## 2016-07-12 NOTE — Telephone Encounter (Signed)
Patient advised of message and agreed to starting  the Humulin N. Patient had no further questions at this time.

## 2016-07-12 NOTE — Telephone Encounter (Signed)
-----   Message from Renato Shin, MD sent at 07/12/2016  2:18 PM EDT ----- please call patient: Pt says he cannot afford levemir.  I have [facility], to change to NPH, 60 units qam.  I'll see you next time.    ----- Message ----- From: Lin Givens, [facility] Sent: 07/11/2016   5:42 PM To: Renato Shin, MD  Dr Loanne Drilling:  [facility] Pharmacist spoke with patient regarding cost concerns with Levemir.  Manufacturer assistance options are limited due to patient VA benefits. He requests Novolin N at [facility] if therapeutically appropriate.   Per [facility], they don't have prescription for Reli-On Brand Novolin N---2 prescriptions with different directions are on medication list.    Can you clarify if Novolin N is appropriate for patient in place of Levemir, and if it is, issue prescription with appropriate directions for Reli-On Novolin N to [facility]?    Thanks for your time.   [name], PharmD, [place] [phone]

## 2016-07-14 ENCOUNTER — Other Ambulatory Visit: Payer: Self-pay | Admitting: Pharmacist

## 2016-07-14 NOTE — Patient Outreach (Signed)
[facility]) Care Management  07/14/2016  Christopher Patel [birth year] [id number]  1524:  Unsuccessful phone outreach to patient.  HIPAA compliant message left requesting return call.   4932:  Incoming call received from patient.  Patient reports he was told MD office sent in NPH insulin to [facility]---he reports not trying to pick up yet due to using up his Levemir.    Advised patient [facility] Pharmacist called [facility] and was told no prescription was there for Humulin N---also appears prescription was sent for Humulin N---it will need to be sent for Novolin N in order to be ~$25/vial.   Plan:  Patient aware will reach out to prescriber office to request Novolin N be sent.   Will continue to assist patient with this process.   [name], PharmD, [place] [phone]

## 2016-07-17 ENCOUNTER — Other Ambulatory Visit: Payer: Self-pay

## 2016-07-17 MED ORDER — INSULIN NPH (HUMAN) (ISOPHANE) 100 UNIT/ML ~~LOC~~ SUSP: 60.0000 [IU] | Freq: Every day | SUBCUTANEOUS | 5 refills | Status: DC

## 2016-07-19 ENCOUNTER — Other Ambulatory Visit: Payer: Self-pay

## 2016-07-19 ENCOUNTER — Encounter: Payer: Self-pay | Admitting: Pharmacist

## 2016-07-19 ENCOUNTER — Other Ambulatory Visit: Payer: Self-pay | Admitting: Pharmacist

## 2016-07-19 MED ORDER — INSULIN NPH (HUMAN) (ISOPHANE) 100 UNIT/ML ~~LOC~~ SUSP
60.0000 [IU] | Freq: Every day | SUBCUTANEOUS | 5 refills | Status: DC
Start: 2016-07-19 — End: 2016-09-01

## 2016-07-19 MED ORDER — INSULIN NPH (HUMAN) (ISOPHANE) 100 UNIT/ML ~~LOC~~ SUSP: 60.0000 [IU] | Freq: Every day | SUBCUTANEOUS | 5 refills | Status: DC

## 2016-07-19 NOTE — Patient Outreach (Signed)
[place]) Care Management  07/19/2016  Wesson Stith 1945/03/28 [id number]  Received message back from Dr Cordelia Pen office, Novolin N was sent to [place] again they confirmed [facility] had it.   Phone call to [facility] [employer], spoke with Nira Conn, who reports they have prescription for Novolin N and it is ready for patient.  [facility] had prescription under Romie Levee, not Etheleen Sia.   1633:  Unsuccessful phone outreach to patient, HIPAA compliant left requesting return call.   1654:  Patient returned call, HIPAA details verified.    Advised patient [facility] has his Novolin N for him under the name, Romie Levee.  Patient reports he has 2 Levemir pens left and he confirms he already has insulin syringes.  He reports he has a family member who uses insulin vial and syringe who plans to show him how.   Offered to patient for [facility] Pharmacist to follow-up with him to ensure he knows how to properly use insulin vial and syringe via a home visit if needed, and he declined at this time.   He was appreciative of the help and he denies other questions/concerns at this time.   Plan:  Will close patient's case as he denies further needs.   He confirmed he has [facility] Pharmacist phone number and was advised he can call [facility] Pharmacist if he decides he needs counseling on use of insulin vial and syringe.   Will send PCP and patient closure letters.    [name], PharmD, [place] [phone]

## 2016-07-31 ENCOUNTER — Telehealth: Payer: Self-pay | Admitting: Cardiology

## 2016-07-31 MED ORDER — METOPROLOL TARTRATE 25 MG PO TABS: 25.0000 mg | ORAL_TABLET | Freq: Two times a day (BID) | ORAL | 3 refills | Status: DC

## 2016-07-31 MED ORDER — METOPROLOL TARTRATE 25 MG PO TABS: 25.0000 mg | ORAL_TABLET | Freq: Two times a day (BID) | ORAL | 0 refills | Status: DC

## 2016-07-31 NOTE — Telephone Encounter (Signed)
New message       Pt c/o medication issue:  1. Name of Medication:  metoprolol 2. How are you currently taking this medication (dosage and times per day)? 25mg   3. Are you having a reaction (difficulty breathing--STAT)? no 4. What is your medication issue? Dr Marlou Porch stopped xarelto but pt does not remember if he is to also stop the metoprolol.  Please call and let him know if he is to continue taking it.  If yes, pt has 3 days of medication left. Please call in a refill at [place] [address]

## 2016-07-31 NOTE — Telephone Encounter (Signed)
Pt aware he is to continue Metoprolol tartrate as listed.  #60 sent into [facility] and #180 sent into [place] as requested.

## 2016-08-01 ENCOUNTER — Ambulatory Visit: Payer: Medicare HMO | Admitting: Endocrinology

## 2016-08-30 DIAGNOSIS — Z79899 Other long term (current) drug therapy: Secondary | ICD-10-CM | POA: Diagnosis not present

## 2016-08-30 DIAGNOSIS — N401 Enlarged prostate with lower urinary tract symptoms: Secondary | ICD-10-CM | POA: Diagnosis not present

## 2016-08-30 DIAGNOSIS — E039 Hypothyroidism, unspecified: Secondary | ICD-10-CM | POA: Diagnosis not present

## 2016-08-30 DIAGNOSIS — E669 Obesity, unspecified: Secondary | ICD-10-CM | POA: Diagnosis not present

## 2016-08-30 DIAGNOSIS — E1165 Type 2 diabetes mellitus with hyperglycemia: Secondary | ICD-10-CM | POA: Diagnosis not present

## 2016-08-30 DIAGNOSIS — E7800 Pure hypercholesterolemia, unspecified: Secondary | ICD-10-CM | POA: Diagnosis not present

## 2016-08-30 DIAGNOSIS — I10 Essential (primary) hypertension: Secondary | ICD-10-CM | POA: Diagnosis not present

## 2016-08-31 ENCOUNTER — Emergency Department (HOSPITAL_COMMUNITY): Payer: Medicare HMO

## 2016-08-31 ENCOUNTER — Inpatient Hospital Stay (HOSPITAL_COMMUNITY)
Admission: EM | Admit: 2016-08-31 | Discharge: 2016-09-03 | DRG: 871 | Disposition: A | Discharge status: Home / Self Care | Payer: Medicare HMO | Attending: Family Medicine | Admitting: Family Medicine

## 2016-08-31 ENCOUNTER — Encounter (HOSPITAL_COMMUNITY): Payer: Self-pay

## 2016-08-31 DIAGNOSIS — Z833 Family history of diabetes mellitus: Secondary | ICD-10-CM | POA: Diagnosis not present

## 2016-08-31 DIAGNOSIS — I2510 Atherosclerotic heart disease of native coronary artery without angina pectoris: Secondary | ICD-10-CM | POA: Diagnosis present

## 2016-08-31 DIAGNOSIS — R1011 Right upper quadrant pain: Secondary | ICD-10-CM | POA: Diagnosis not present

## 2016-08-31 DIAGNOSIS — R1031 Right lower quadrant pain: Secondary | ICD-10-CM | POA: Diagnosis not present

## 2016-08-31 DIAGNOSIS — Z888 Allergy status to other drugs, medicaments and biological substances status: Secondary | ICD-10-CM | POA: Diagnosis not present

## 2016-08-31 DIAGNOSIS — E890 Postprocedural hypothyroidism: Secondary | ICD-10-CM | POA: Diagnosis present

## 2016-08-31 DIAGNOSIS — K529 Noninfective gastroenteritis and colitis, unspecified: Secondary | ICD-10-CM | POA: Diagnosis not present

## 2016-08-31 DIAGNOSIS — R6510 Systemic inflammatory response syndrome (SIRS) of non-infectious origin without acute organ dysfunction: Secondary | ICD-10-CM | POA: Diagnosis present

## 2016-08-31 DIAGNOSIS — K760 Fatty (change of) liver, not elsewhere classified: Secondary | ICD-10-CM | POA: Diagnosis present

## 2016-08-31 DIAGNOSIS — J189 Pneumonia, unspecified organism: Secondary | ICD-10-CM | POA: Diagnosis not present

## 2016-08-31 DIAGNOSIS — R740 Nonspecific elevation of levels of transaminase and lactic acid dehydrogenase [LDH]: Secondary | ICD-10-CM | POA: Diagnosis not present

## 2016-08-31 DIAGNOSIS — Z86010 Personal history of colonic polyps: Secondary | ICD-10-CM | POA: Diagnosis not present

## 2016-08-31 DIAGNOSIS — I10 Essential (primary) hypertension: Secondary | ICD-10-CM | POA: Diagnosis present

## 2016-08-31 DIAGNOSIS — Z8249 Family history of ischemic heart disease and other diseases of the circulatory system: Secondary | ICD-10-CM | POA: Diagnosis not present

## 2016-08-31 DIAGNOSIS — R7881 Bacteremia: Secondary | ICD-10-CM | POA: Diagnosis not present

## 2016-08-31 DIAGNOSIS — E876 Hypokalemia: Secondary | ICD-10-CM | POA: Diagnosis present

## 2016-08-31 DIAGNOSIS — Z87891 Personal history of nicotine dependence: Secondary | ICD-10-CM

## 2016-08-31 DIAGNOSIS — R1084 Generalized abdominal pain: Secondary | ICD-10-CM | POA: Diagnosis not present

## 2016-08-31 DIAGNOSIS — Z7982 Long term (current) use of aspirin: Secondary | ICD-10-CM

## 2016-08-31 DIAGNOSIS — A4151 Sepsis due to Escherichia coli [E. coli]: Secondary | ICD-10-CM | POA: Diagnosis not present

## 2016-08-31 DIAGNOSIS — R509 Fever, unspecified: Secondary | ICD-10-CM | POA: Diagnosis not present

## 2016-08-31 DIAGNOSIS — B9620 Unspecified Escherichia coli [E. coli] as the cause of diseases classified elsewhere: Secondary | ICD-10-CM | POA: Diagnosis present

## 2016-08-31 DIAGNOSIS — E119 Type 2 diabetes mellitus without complications: Secondary | ICD-10-CM

## 2016-08-31 DIAGNOSIS — Z96651 Presence of right artificial knee joint: Secondary | ICD-10-CM | POA: Diagnosis present

## 2016-08-31 DIAGNOSIS — K8020 Calculus of gallbladder without cholecystitis without obstruction: Secondary | ICD-10-CM | POA: Diagnosis present

## 2016-08-31 DIAGNOSIS — R109 Unspecified abdominal pain: Secondary | ICD-10-CM | POA: Diagnosis not present

## 2016-08-31 DIAGNOSIS — R932 Abnormal findings on diagnostic imaging of liver and biliary tract: Secondary | ICD-10-CM | POA: Diagnosis not present

## 2016-08-31 DIAGNOSIS — A419 Sepsis, unspecified organism: Secondary | ICD-10-CM | POA: Diagnosis not present

## 2016-08-31 DIAGNOSIS — Z885 Allergy status to narcotic agent status: Secondary | ICD-10-CM | POA: Diagnosis not present

## 2016-08-31 DIAGNOSIS — D3502 Benign neoplasm of left adrenal gland: Secondary | ICD-10-CM | POA: Diagnosis present

## 2016-08-31 DIAGNOSIS — I4891 Unspecified atrial fibrillation: Secondary | ICD-10-CM | POA: Diagnosis not present

## 2016-08-31 DIAGNOSIS — Z7984 Long term (current) use of oral hypoglycemic drugs: Secondary | ICD-10-CM | POA: Diagnosis not present

## 2016-08-31 DIAGNOSIS — R7401 Elevation of levels of liver transaminase levels: Secondary | ICD-10-CM | POA: Diagnosis present

## 2016-08-31 DIAGNOSIS — E872 Acidosis, unspecified: Secondary | ICD-10-CM | POA: Diagnosis present

## 2016-08-31 DIAGNOSIS — R945 Abnormal results of liver function studies: Secondary | ICD-10-CM | POA: Diagnosis present

## 2016-08-31 DIAGNOSIS — R7989 Other specified abnormal findings of blood chemistry: Secondary | ICD-10-CM

## 2016-08-31 DIAGNOSIS — Z79899 Other long term (current) drug therapy: Secondary | ICD-10-CM

## 2016-08-31 DIAGNOSIS — Z794 Long term (current) use of insulin: Secondary | ICD-10-CM | POA: Diagnosis not present

## 2016-08-31 LAB — COMPREHENSIVE METABOLIC PANEL
ALK PHOS: 82 U/L (ref 38–126)
ALT: 262 U/L — ABNORMAL HIGH (ref 17–63)
ANION GAP: 11 (ref 5–15)
AST: 360 U/L — ABNORMAL HIGH (ref 15–41)
Albumin: 4.1 g/dL (ref 3.5–5.0)
BILIRUBIN TOTAL: 2 mg/dL — AB (ref 0.3–1.2)
BUN: 11 mg/dL (ref 6–20)
CALCIUM: 9 mg/dL (ref 8.9–10.3)
CO2: 28 mmol/L (ref 22–32)
CREATININE: 1.07 mg/dL (ref 0.61–1.24)
Chloride: 97 mmol/L — ABNORMAL LOW (ref 101–111)
GFR calc non Af Amer: >60 mL/min (ref >60)
Glucose, Bld: 221 mg/dL — ABNORMAL HIGH (ref 65–99)
Potassium: 3.8 mmol/L (ref 3.5–5.1)
Sodium: 136 mmol/L (ref 135–145)
Total Protein: 6.8 g/dL (ref 6.5–8.1)

## 2016-08-31 LAB — CBC
HCT: 42.4 % (ref 39.0–52.0)
HEMOGLOBIN: 14.3 g/dL (ref 13.0–17.0)
MCH: 29.7 pg (ref 26.0–34.0)
MCHC: 33.7 g/dL (ref 30.0–36.0)
MCV: 88.1 fL (ref 78.0–100.0)
PLATELETS: 214 10*3/uL (ref 150–400)
RBC: 4.81 MIL/uL (ref 4.22–5.81)
RDW: 13.9 % (ref 11.5–15.5)
WBC: 14.5 10*3/uL — AB (ref 4.0–10.5)

## 2016-08-31 LAB — URINALYSIS, ROUTINE W REFLEX MICROSCOPIC
Bilirubin Urine: NEGATIVE
Glucose, UA: 50 mg/dL — AB
Hgb urine dipstick: NEGATIVE
Ketones, ur: NEGATIVE mg/dL
LEUKOCYTES UA: NEGATIVE
Nitrite: NEGATIVE
PROTEIN: NEGATIVE mg/dL
SPECIFIC GRAVITY, URINE: 1.015 (ref 1.005–1.030)
pH: 5 (ref 5.0–8.0)

## 2016-08-31 LAB — I-STAT CG4 LACTIC ACID, ED
LACTIC ACID, VENOUS: 4.16 mmol/L — AB (ref 0.5–1.9)
LACTIC ACID, VENOUS: 2.87 mmol/L — AB (ref 0.5–1.9)

## 2016-08-31 LAB — LIPASE, BLOOD: Lipase: 91 U/L — ABNORMAL HIGH (ref 11–51)

## 2016-08-31 MED ORDER — PIPERACILLIN-TAZOBACTAM 3.375 G IVPB
3.3750 g | Freq: Three times a day (TID) | INTRAVENOUS | Status: DC
  Administered 2016-09-01 (×2): 3.375 g via INTRAVENOUS
  Filled 2016-08-31 (×3): qty 50

## 2016-08-31 MED ORDER — HYDROMORPHONE HCL 1 MG/ML IJ SOLN
0.5000 mg | INTRAMUSCULAR | Status: DC | PRN
  Administered 2016-08-31 (×2): 0.5 mg via INTRAVENOUS
  Filled 2016-08-31 (×2): qty 1

## 2016-08-31 MED ORDER — IOPAMIDOL (ISOVUE-300) INJECTION 61%
100.0000 mL | Freq: Once | INTRAVENOUS | Status: AC | PRN
  Administered 2016-08-31: 100 mL via INTRAVENOUS

## 2016-08-31 MED ORDER — METOPROLOL TARTRATE 25 MG PO TABS
25.0000 mg | ORAL_TABLET | Freq: Two times a day (BID) | ORAL | Status: DC
  Administered 2016-09-01 – 2016-09-03 (×5): 25 mg via ORAL
  Filled 2016-08-31 (×6): qty 1

## 2016-08-31 MED ORDER — ACETAMINOPHEN 325 MG PO TABS
650.0000 mg | ORAL_TABLET | Freq: Four times a day (QID) | ORAL | Status: DC | PRN
  Administered 2016-09-01 – 2016-09-02 (×3): 650 mg via ORAL
  Filled 2016-08-31 (×3): qty 2

## 2016-08-31 MED ORDER — PIPERACILLIN-TAZOBACTAM 3.375 G IVPB 30 MIN
3.3750 g | Freq: Once | INTRAVENOUS | Status: AC
  Administered 2016-08-31: 3.375 g via INTRAVENOUS
  Filled 2016-08-31: qty 50

## 2016-08-31 MED ORDER — HYDROMORPHONE HCL 1 MG/ML IJ SOLN
0.5000 mg | INTRAMUSCULAR | Status: DC | PRN
  Administered 2016-09-01 (×3): 0.5 mg via INTRAVENOUS
  Filled 2016-08-31 (×3): qty 0.5

## 2016-08-31 MED ORDER — LEVOTHYROXINE SODIUM 150 MCG PO TABS
150.0000 ug | ORAL_TABLET | Freq: Every day | ORAL | Status: DC
  Administered 2016-09-01 – 2016-09-03 (×3): 150 ug via ORAL
  Filled 2016-08-31 (×3): qty 2
  Filled 2016-08-31 (×3): qty 1

## 2016-08-31 MED ORDER — ACETAMINOPHEN 325 MG PO TABS
650.0000 mg | ORAL_TABLET | Freq: Once | ORAL | Status: AC
  Administered 2016-08-31: 650 mg via ORAL
  Filled 2016-08-31: qty 2

## 2016-08-31 MED ORDER — ADULT MULTIVITAMIN W/MINERALS CH
1.0000 | ORAL_TABLET | Freq: Every day | ORAL | Status: DC
  Administered 2016-09-01 – 2016-09-03 (×3): 1 via ORAL
  Filled 2016-08-31 (×3): qty 1

## 2016-08-31 MED ORDER — SODIUM CHLORIDE 0.9 % IV BOLUS (SEPSIS)
1000.0000 mL | Freq: Once | INTRAVENOUS | Status: AC
  Administered 2016-08-31: 1000 mL via INTRAVENOUS

## 2016-08-31 MED ORDER — ASPIRIN EC 81 MG PO TBEC
81.0000 mg | DELAYED_RELEASE_TABLET | Freq: Every day | ORAL | Status: DC
  Administered 2016-09-01 – 2016-09-03 (×3): 81 mg via ORAL
  Filled 2016-08-31 (×3): qty 1

## 2016-08-31 MED ORDER — ATORVASTATIN CALCIUM 40 MG PO TABS
40.0000 mg | ORAL_TABLET | Freq: Every day | ORAL | Status: DC
  Administered 2016-09-01 – 2016-09-02 (×3): 40 mg via ORAL
  Filled 2016-08-31 (×3): qty 1

## 2016-08-31 MED ORDER — FLUTICASONE PROPIONATE 50 MCG/ACT NA SUSP
2.0000 | Freq: Every day | NASAL | Status: DC | PRN
Start: 2016-08-31 — End: 2016-09-03

## 2016-08-31 MED ORDER — SODIUM CHLORIDE 0.9 % IV SOLN
INTRAVENOUS | Status: DC
  Administered 2016-08-31 – 2016-09-01 (×2): via INTRAVENOUS

## 2016-08-31 MED ORDER — ONDANSETRON HCL 4 MG/2ML IJ SOLN
4.0000 mg | Freq: Once | INTRAMUSCULAR | Status: AC
  Administered 2016-08-31: 4 mg via INTRAVENOUS
  Filled 2016-08-31: qty 2

## 2016-08-31 MED ORDER — PANTOPRAZOLE SODIUM 40 MG PO TBEC
40.0000 mg | DELAYED_RELEASE_TABLET | Freq: Every day | ORAL | Status: DC
  Administered 2016-09-01 – 2016-09-03 (×3): 40 mg via ORAL
  Filled 2016-08-31 (×3): qty 1

## 2016-08-31 NOTE — ED Notes (Signed)
Pt taken back to x-ray

## 2016-08-31 NOTE — ED Triage Notes (Signed)
Per EMS, pt from [place] with complaint of abd pain that began at 1000 this morning. Pt went to [facility] and was sent here. Pt given 164mcg of fentanyl in route with relief and 4mg  zofran. LBM yesterday. Denies n/v/d.

## 2016-08-31 NOTE — ED Notes (Signed)
Pt returned from x-ray, unable to complete xray due to dry heaving.

## 2016-08-31 NOTE — ED Provider Notes (Addendum)
[facility] DEPT Provider Note   CSN: [id number] Arrival date & time: 08/31/16  1803     History   Chief Complaint Chief Complaint  Patient presents with  . Abdominal Pain    HPI Christopher Patel is a 71 y.o. male.  HPI Patient presents to the emergency room for evaluation of diffuse abdominal pain. Patient states it started this morning at about 10:00.  Patient states she's having pain throughout his entire abdomen. The pain is severe. He denies any trouble with vomiting but has had some nausea. He denies any trouble with diarrhea constipation. He went to an urgent care and was referred to the emergency room. Patient was given fentanyl and Zofran by EMS.  Patient denies any chest pain or shortness of breath. He denies any chronic abdominal pain issues. He denies any prior abdominal surgeries. Past Medical History:  Diagnosis Date  . CAD (coronary artery disease)   . Change in voice   . Cough   . Depression   . Diabetes mellitus 04-25-11   oral meds  . ED (erectile dysfunction)   . Hepatic lesion    left lobe  . Hyperlipidemia   . Hypertension    dr Marilynn Rail  . Hypothyroidism   . Lung nodule   . Nasal congestion   . Osteoarthritis 04-25-11   spine area-some neck issues  . Paroxysmal atrial fibrillation (HCC)    started on metoprolol and Xarelto 07/02/2015 for afib RVR  . Pneumonia   . PONV (postoperative nausea and vomiting)   . Thyroid disease    thyroid lesion  . Tubular adenoma    polyps- Dr.Edwards    Patient Active Problem List   Diagnosis Date Noted  . Paroxysmal atrial fibrillation with RVR ([place]) 07/02/2015  . Status post right knee replacement 05/04/2013  . Essential hypertension, benign 05/04/2013  . Diabetes mellitus ([place]) 05/04/2013  . Dyslipidemia 05/04/2013  . Allergic rhinitis 05/04/2013  . Abdominal pain, unspecified site 04/27/2013  . Arthritis of right knee 04/25/2013  . Hypothyroidism, postsurgical 05/17/2011  . Multinodular goiter  (nontoxic) 04/05/2011    Past Surgical History:  Procedure Laterality Date  . CARDIAC CATHETERIZATION  04-25-11   many yrs ago-very slight blockage 1 vessel (60% RCA by Dr. Dorthy Cooler notes)  . EYE SURGERY  11/2010   cataract x2  . EYE SURGERY  12/2010   len implant x2   . KNEE SURGERY  04-25-11   right/ left knee scope  . THYROIDECTOMY  05/02/2011   Procedure: THYROIDECTOMY;  Surgeon: Earnstine Regal, MD;  Location: [facility];  Service: General;  Laterality: N/A;  Total Thyroidectomy  . TOTAL KNEE ARTHROPLASTY Right 04/25/2013   Procedure: TOTAL KNEE ARTHROPLASTY;  Surgeon: Kerin Salen, MD;  Location: [facility];  Service: Orthopedics;  Laterality: Right;       Home Medications    Prior to Admission medications   Medication Sig Start Date End Date Taking? Authorizing Provider  acetaminophen (TYLENOL) 500 MG tablet Take 500 mg by mouth every 6 (six) hours as needed for moderate pain (BACK PAIN).   Yes [provider]  amLODipine (NORVASC) 10 MG tablet Take 10 mg by mouth daily.   Yes [provider]  aspirin EC 81 MG tablet Take 81 mg by mouth daily.   Yes [provider]  atorvastatin (LIPITOR) 80 MG tablet Take 40 mg by mouth at bedtime.   Yes [provider]  fexofenadine (ALLEGRA) 180 MG tablet Take 180 mg by  mouth daily as needed. Allergies    Yes [provider]  fluticasone (VERAMYST) 27.5 MCG/SPRAY nasal spray Place 2 sprays into the nose daily as needed for allergies.    Yes [provider]  hydrochlorothiazide (HYDRODIURIL) 25 MG tablet Take 25 mg by mouth daily.   Yes [provider]  insulin NPH Human (NOVOLIN N) 100 UNIT/ML injection Inject 0.6 mLs (60 Units total) into the skin daily. 07/19/16  Yes Renato Shin, MD  irbesartan (AVAPRO) 300 MG tablet Take 300 mg by mouth daily.   Yes [provider]  levothyroxine (SYNTHROID, LEVOTHROID) 150 MCG tablet Take 150 mcg by mouth daily before breakfast.   Yes [provider]  metFORMIN (GLUCOPHAGE-XR) 500 MG 24 hr tablet Take 1,000 mg by mouth 2 (two) times daily.   Yes [provider]  metoprolol tartrate (LOPRESSOR) 25 MG tablet Take 1 tablet (25 mg total) by mouth 2 (two) times daily. 07/31/16  Yes Jerline Pain, MD  Multiple Vitamin (MULTIVITAMIN) tablet Take 1 tablet by mouth daily.   Yes [provider]  pantoprazole (PROTONIX) 40 MG tablet Take 40 mg by mouth daily.   Yes [provider]  Insulin Syringe-Needle U-100 (SAFETY INSULIN SYRINGES) 30G X 1/2" 1 ML MISC Use to inject insulin once daily Patient not taking: Reported on 08/31/2016 06/15/16   Renato Shin, MD  valsartan (DIOVAN) 320 MG tablet Take 1 tablet (320 mg total) by mouth daily. Patient not taking: Reported on 08/31/2016 07/26/15   Almyra Deforest, PA    Family History Family History  Problem Relation Age of Onset  . Diabetes Mother   . Diabetes Brother   . Colon cancer Brother   . COPD Other   . Hypertension Other   . Hyperlipidemia Other   . Heart attack Other   . Diabetes Sister     Social History Social History  Substance Use Topics  . Smoking status: Former Smoker    Years: 50.00    Quit date: 11/24/2009  . Smokeless tobacco: Never Used  . Alcohol use Yes     Comment: Beer on weekends, 6 or more     Allergies   Actos [pioglitazone hydrochloride]; Glyburide; Metformin and related; and Codeine   Review of Systems Review of Systems  All other systems reviewed and are negative.    Physical Exam Updated Vital Signs BP 124/73   Pulse (!) 103   Temp 99.7 F (37.6 C) (Oral)   Resp (!) 24   Ht 1.778 m (5\' 10" )   Wt 96.6 kg (213 lb)   SpO2 93%   BMI 30.56 kg/m   Physical Exam  Constitutional: He appears well-developed and well-nourished. No distress.  HENT:  Head: Normocephalic and atraumatic.  Right Ear: External ear normal.  Left Ear: External ear normal.  Eyes: Conjunctivae are normal. Right eye exhibits no discharge. Left eye  exhibits no discharge. No scleral icterus.  Neck: Neck supple. No tracheal deviation present.  Cardiovascular: Normal rate, regular rhythm and intact distal pulses.   Pulmonary/Chest: Effort normal and breath sounds normal. No stridor. No respiratory distress. He has no wheezes. He has no rales.  Abdominal: Soft. Bowel sounds are normal. He exhibits no distension. There is generalized tenderness. There is guarding. There is no rebound. No hernia.  Musculoskeletal: He exhibits no edema or tenderness.  Neurological: He is alert. He has normal strength. No cranial nerve deficit (no facial droop, extraocular movements intact, no slurred speech) or sensory deficit. He exhibits  normal muscle tone. He displays no seizure activity. Coordination normal.  Skin: Skin is warm and dry. No rash noted.  Psychiatric: He has a normal mood and affect.  Nursing note and vitals reviewed.    ED Treatments / Results  Labs (all labs ordered are listed, but only abnormal results are displayed) Labs Reviewed  LIPASE, BLOOD - Abnormal; Notable for the following:       Result Value   Lipase 91 (*)    All other components within normal limits  COMPREHENSIVE METABOLIC PANEL - Abnormal; Notable for the following:    Chloride 97 (*)    Glucose, Bld 221 (*)    AST 360 (*)    ALT 262 (*)    Total Bilirubin 2.0 (*)    All other components within normal limits  CBC - Abnormal; Notable for the following:    WBC 14.5 (*)    All other components within normal limits  URINALYSIS, ROUTINE W REFLEX MICROSCOPIC - Abnormal; Notable for the following:    Glucose, UA 50 (*)    All other components within normal limits  I-STAT CG4 LACTIC ACID, ED - Abnormal; Notable for the following:    Lactic Acid, Venous 4.16 (*)    All other components within normal limits  I-STAT CG4 LACTIC ACID, ED - Abnormal; Notable for the following:    Lactic Acid, Venous 2.87 (*)    All other components within normal limits  CULTURE, BLOOD  (ROUTINE X 2)  CULTURE, BLOOD (ROUTINE X 2)    EKG  EKG Interpretation None       Radiology Ct Abdomen Pelvis W Contrast  Result Date: 08/31/2016 CLINICAL DATA:  Right lower quadrant pain and nausea beginning this morning. EXAM: CT ABDOMEN AND PELVIS WITH CONTRAST TECHNIQUE: Multidetector CT imaging of the abdomen and pelvis was performed using the standard protocol following bolus administration of intravenous contrast. CONTRAST:  138mL ISOVUE-300 IOPAMIDOL (ISOVUE-300) INJECTION 61% COMPARISON:  04/27/2013 CT, MRI 06/17/2014 FINDINGS: Lower chest: The included heart is normal in size without pericardial effusion. There is bibasilar dependent atelectasis. Hepatobiliary: Hepatic steatosis with uncomplicated cholelithiasis. No biliary dilatation. Hemangioma seen on MRI is not apparent on current exam. This could be due timing of contrast. Pancreas: Unremarkable. No pancreatic ductal dilatation or surrounding inflammatory changes. Spleen: Normal in size without focal abnormality. Adrenals/Urinary Tract: Hypodense left anterior limb adrenal nodule measuring 1.4 x 1.6 x 1.3 cm is stable and consistent with an adenoma based on low Hounsfield unit of 25 post contrast CT. Right adrenal gland is unremarkable. The kidneys demonstrate symmetric enhancement without obstructive uropathy. The urinary bladder is physiologic. Stomach/Bowel: Slight thickening of the ascending colon with submucosal fatty deposition. Findings may reflect stigmata of inflammatory bowel disease or mild colitis. Normal-appearing appendix. The stomach and small intestine are nonacute. Next Vascular/Lymphatic: Moderate-to-marked atherosclerotic calcifications of the abdominal aorta extending into the common iliac arteries and their branch vessels. Mild branch vessel atherosclerosis off the aorta is noted involving the SMA. No adenopathy. Reproductive: Normal size prostate and seminal vesicles. Calcifications of the vas deferens may be seen  with diabetes. Other: No abdominal wall hernia or abnormality. No abdominopelvic ascites. Musculoskeletal: Degenerative changes are seen along the dorsal spine with small anterior osteophytes of the included lower thoracic spine and to a lesser degree the lumbar spine. Slight straightening of lumbar lordosis. Mild degenerate facet hypertrophy is identified of the lumbar facets. No suspicious osseous abnormality. IMPRESSION: 1. Slight thickened appearance of the ascending colon with submucosal fat  deposition weight represent stigmata of inflammatory bowel. A mild colitis is not entirely excluded. 2. No bowel obstruction is noted. 3. Stable left adrenal adenoma measuring 1.4 x 1.6 x 1.3 cm. 4. Mild hepatic steatosis. 5. Moderate aortoiliac and branch vessel atherosclerosis. Electronically Signed   By: Ashley Royalty M.D.   On: 08/31/2016 21:12   Dg Abd Acute W/chest  Result Date: 08/31/2016 CLINICAL DATA:  Abdominal pain EXAM: DG ABDOMEN ACUTE W/ 1V CHEST COMPARISON:  Chest x-ray   07/02/2015 FINDINGS: Right lung clear. Opacity left lung base is probably atelectatic although pneumonia cannot be completely excluded. The cardiopericardial silhouette is within normal limits for size. The visualized bony structures of the thorax are intact. Upright film shows no evidence for intraperitoneal free air. There is no evidence for gaseous bowel dilation to suggest obstruction. Visualized bony anatomy is unremarkable. IMPRESSION: 1. Left base opacity is probably atelectasis but pneumonia cannot be completely excluded. 2. No evidence for bowel perforation or obstruction. Electronically Signed   By: Misty Stanley M.D.   On: 08/31/2016 20:13    Procedures .Critical Care Performed by: Dorie Rank Authorized by: Dorie Rank   Critical care provider statement:    Critical care time (minutes):  35   Critical care was time spent personally by me on the following activities:  Discussions with consultants, evaluation of patient's  response to treatment, examination of patient, ordering and performing treatments and interventions, ordering and review of laboratory studies, ordering and review of radiographic studies, pulse oximetry, re-evaluation of patient's condition, obtaining history from patient or surrogate and review of old charts    (including critical care time)  Medications Ordered in ED Medications  sodium chloride 0.9 % bolus 1,000 mL (0 mLs Intravenous Stopped 08/31/16 2018)    And  0.9 %  sodium chloride infusion ( Intravenous New Bag/Given 08/31/16 1919)  HYDROmorphone (DILAUDID) injection 0.5 mg (0.5 mg Intravenous Given 08/31/16 2316)  piperacillin-tazobactam (ZOSYN) IVPB 3.375 g (not administered)  HYDROmorphone (DILAUDID) injection 0.5 mg (not administered)  ondansetron (ZOFRAN) injection 4 mg (4 mg Intravenous Given 08/31/16 1918)  sodium chloride 0.9 % bolus 1,000 mL (0 mLs Intravenous Stopped 08/31/16 2011)    And  sodium chloride 0.9 % bolus 1,000 mL (0 mLs Intravenous Stopped 08/31/16 2202)    And  sodium chloride 0.9 % bolus 1,000 mL (0 mLs Intravenous Stopped 08/31/16 2014)  piperacillin-tazobactam (ZOSYN) IVPB 3.375 g (0 g Intravenous Stopped 08/31/16 2011)  acetaminophen (TYLENOL) tablet 650 mg (650 mg Oral Given 08/31/16 2013)  iopamidol (ISOVUE-300) 61 % injection 100 mL (100 mLs Intravenous Contrast Given 08/31/16 2038)     Initial Impression / Assessment and Plan / ED Course  I have reviewed the triage vital signs and the nursing notes.  Pertinent labs & imaging results that were available during my care of the patient were reviewed by me and considered in my medical decision making (see chart for details).  Clinical Course as of Sep 01 2339  Thu Aug 31, 2016  6378 Notified that lactic acid level is elevated.  No fever here but he is having chills.  Concerning for possible sepsis.  IV abx and fluid ordered.  Imaging tests including CXR are pending  [JK]  2321 Discussed with Dr Oletta Lamas.  Will consult  on pt in the am  [JK]    Clinical Course User Index [JK] Dorie Rank, MD    Patient presented to the emergency room with complaints of abdominal pain,  while he was in  the emergency room he started developing chills. He also developed a low-grade temperature. Patient's laboratory tests are notable for an elevated lactic acid level and increased lfts.  No signs of severe sepsis. His LFTs were also elevated with an elevated bilirubin. CT scan does not show any evidence of cholecystitis but it does show cholelithiasis.  There is no evidence of biliary duct dilatation on the CT scan. This argues against acute cholangitis or choledocholithiasis. It is possible some of his symptoms may be related to his gallbladder .  At this point, patient is hemodynamically stable. I will consult the medical service for admission. I will consult with GI.  Consider surgical consult  Final Clinical Impressions(s) / ED Diagnoses   Final diagnoses:  Calculus of gallbladder without cholecystitis without obstruction  Fever, unspecified fever cause  Community acquired pneumonia, unspecified laterality  Sepsis, due to unspecified organism [facility])    New Prescriptions New Prescriptions   No medications on file     Dorie Rank, MD 08/31/16 2234    Dorie Rank, MD 08/31/16 [phone]

## 2016-08-31 NOTE — Progress Notes (Signed)
Pharmacy Antibiotic Note  Vue Pavon is a 71 y.o. male admitted on 08/31/2016 with intra-abdominal infection.  Pharmacy has been consulted for Zosyn dosing. SCr 1.07 on admit, CrCl~76, LA 4.16, WBC 14.5, afebrile.  Plan: Zosyn 3.375g IV (47min inf) x1; then 3.375g IV q8h (4h inf) Monitor clinical progress, c/s, renal function F/u de-escalation plan/LOT   Height: 5\' 10"  (177.8 cm) Weight: 213 lb (96.6 kg) IBW/kg (Calculated) : 73  Temp (24hrs), Avg:97.7 F (36.5 C), Min:97.7 F (36.5 C), Max:97.7 F (36.5 C)   Recent Labs Lab 08/31/16 1816 08/31/16 1927  WBC 14.5*  --   CREATININE 1.07  --   LATICACIDVEN  --  4.16*    Estimated Creatinine Clearance: 75.9 mL/min (by C-G formula based on SCr of 1.07 mg/dL).    Allergies  Allergen Reactions  . Actos [Pioglitazone Hydrochloride] Nausea Only  . Glyburide Other (See Comments)    hypoglycemia  . Metformin And Related Other (See Comments)    Hypoglycemia, shakes Fast acting metformin  . Codeine Itching    Elicia Lamp, PharmD, BCPS Clinical Pharmacist 08/31/2016 7:44 PM

## 2016-08-31 NOTE — ED Notes (Signed)
Patient transported to CT 

## 2016-08-31 NOTE — ED Notes (Addendum)
Pt taken to CT.

## 2016-08-31 NOTE — ED Notes (Signed)
Pt back from CT

## 2016-08-31 NOTE — ED Notes (Addendum)
Pt's spo2 keeps dropping to the mid 80s on RA, pt placed on 2L  and spo2 up to 94%. Pt coughing up yellow phlem

## 2016-08-31 NOTE — ED Notes (Signed)
Dr Tomi Bamberger and Phlebotomy in room.

## 2016-09-01 ENCOUNTER — Observation Stay (HOSPITAL_COMMUNITY): Payer: Medicare HMO

## 2016-09-01 DIAGNOSIS — E872 Acidosis: Secondary | ICD-10-CM | POA: Diagnosis not present

## 2016-09-01 DIAGNOSIS — K8020 Calculus of gallbladder without cholecystitis without obstruction: Secondary | ICD-10-CM | POA: Diagnosis not present

## 2016-09-01 DIAGNOSIS — R1011 Right upper quadrant pain: Secondary | ICD-10-CM

## 2016-09-01 DIAGNOSIS — R740 Nonspecific elevation of levels of transaminase and lactic acid dehydrogenase [LDH]: Secondary | ICD-10-CM | POA: Diagnosis not present

## 2016-09-01 DIAGNOSIS — I10 Essential (primary) hypertension: Secondary | ICD-10-CM | POA: Diagnosis not present

## 2016-09-01 DIAGNOSIS — Z794 Long term (current) use of insulin: Secondary | ICD-10-CM

## 2016-09-01 DIAGNOSIS — E119 Type 2 diabetes mellitus without complications: Secondary | ICD-10-CM | POA: Diagnosis not present

## 2016-09-01 DIAGNOSIS — J189 Pneumonia, unspecified organism: Secondary | ICD-10-CM | POA: Diagnosis not present

## 2016-09-01 DIAGNOSIS — R6510 Systemic inflammatory response syndrome (SIRS) of non-infectious origin without acute organ dysfunction: Secondary | ICD-10-CM | POA: Diagnosis not present

## 2016-09-01 LAB — BLOOD CULTURE ID PANEL (REFLEXED)
Acinetobacter baumannii: NOT DETECTED
CANDIDA PARAPSILOSIS: NOT DETECTED
CANDIDA TROPICALIS: NOT DETECTED
CARBAPENEM RESISTANCE: NOT DETECTED
Candida albicans: NOT DETECTED
Candida glabrata: NOT DETECTED
Candida krusei: NOT DETECTED
ENTEROCOCCUS SPECIES: NOT DETECTED
Enterobacter cloacae complex: NOT DETECTED
Enterobacteriaceae species: DETECTED — AB
Escherichia coli: DETECTED — AB
HAEMOPHILUS INFLUENZAE: NOT DETECTED
KLEBSIELLA PNEUMONIAE: NOT DETECTED
Klebsiella oxytoca: NOT DETECTED
Listeria monocytogenes: NOT DETECTED
Methicillin resistance: NOT DETECTED
Neisseria meningitidis: NOT DETECTED
PROTEUS SPECIES: NOT DETECTED
PSEUDOMONAS AERUGINOSA: NOT DETECTED
STAPHYLOCOCCUS AUREUS BCID: NOT DETECTED
STAPHYLOCOCCUS SPECIES: NOT DETECTED
Serratia marcescens: NOT DETECTED
Streptococcus agalactiae: NOT DETECTED
Streptococcus pneumoniae: NOT DETECTED
Streptococcus pyogenes: NOT DETECTED
Streptococcus species: NOT DETECTED
VANCOMYCIN RESISTANCE: NOT DETECTED

## 2016-09-01 LAB — GLUCOSE, CAPILLARY
GLUCOSE-CAPILLARY: 218 mg/dL — AB (ref 65–99)
GLUCOSE-CAPILLARY: 117 mg/dL — AB (ref 65–99)
Glucose-Capillary: 193 mg/dL — ABNORMAL HIGH (ref 65–99)
Glucose-Capillary: 117 mg/dL — ABNORMAL HIGH (ref 65–99)

## 2016-09-01 LAB — COMPREHENSIVE METABOLIC PANEL
ALT: 564 U/L — ABNORMAL HIGH (ref 17–63)
AST: 594 U/L — ABNORMAL HIGH (ref 15–41)
Albumin: 3.2 g/dL — ABNORMAL LOW (ref 3.5–5.0)
Alkaline Phosphatase: 67 U/L (ref 38–126)
Anion gap: 11 (ref 5–15)
BUN: 10 mg/dL (ref 6–20)
CHLORIDE: 99 mmol/L — AB (ref 101–111)
CO2: 26 mmol/L (ref 22–32)
Calcium: 7.9 mg/dL — ABNORMAL LOW (ref 8.9–10.3)
Creatinine, Ser: 1.11 mg/dL (ref 0.61–1.24)
Glucose, Bld: 219 mg/dL — ABNORMAL HIGH (ref 65–99)
POTASSIUM: 3.6 mmol/L (ref 3.5–5.1)
SODIUM: 136 mmol/L (ref 135–145)
Total Bilirubin: 2.8 mg/dL — ABNORMAL HIGH (ref 0.3–1.2)
Total Protein: 5.5 g/dL — ABNORMAL LOW (ref 6.5–8.1)

## 2016-09-01 LAB — LACTIC ACID, PLASMA: LACTIC ACID, VENOUS: 2 mmol/L — AB (ref 0.5–1.9)

## 2016-09-01 MED ORDER — INSULIN ASPART 100 UNIT/ML ~~LOC~~ SOLN
0.0000 [IU] | Freq: Three times a day (TID) | SUBCUTANEOUS | Status: DC
  Administered 2016-09-01: 3 [IU] via SUBCUTANEOUS
  Administered 2016-09-01: 5 [IU] via SUBCUTANEOUS
  Administered 2016-09-02: 3 [IU] via SUBCUTANEOUS
  Administered 2016-09-02: 5 [IU] via SUBCUTANEOUS
  Administered 2016-09-03: 3 [IU] via SUBCUTANEOUS

## 2016-09-01 MED ORDER — DEXTROSE 5 % IV SOLN
2.0000 g | INTRAVENOUS | Status: DC
  Administered 2016-09-01 – 2016-09-02 (×2): 2 g via INTRAVENOUS
  Filled 2016-09-01 (×3): qty 2

## 2016-09-01 MED ORDER — ONDANSETRON HCL 4 MG PO TABS: 4.0000 mg | ORAL_TABLET | Freq: Four times a day (QID) | ORAL | Status: DC | PRN

## 2016-09-01 MED ORDER — ONDANSETRON HCL 4 MG/2ML IJ SOLN: 4.0000 mg | Freq: Four times a day (QID) | INTRAMUSCULAR | Status: DC | PRN

## 2016-09-01 MED ORDER — ENOXAPARIN SODIUM 40 MG/0.4ML ~~LOC~~ SOLN
40.0000 mg | SUBCUTANEOUS | Status: DC
  Administered 2016-09-01 – 2016-09-02 (×2): 40 mg via SUBCUTANEOUS
  Filled 2016-09-01 (×2): qty 0.4

## 2016-09-01 MED ORDER — AMLODIPINE BESYLATE 5 MG PO TABS
10.0000 mg | ORAL_TABLET | Freq: Every day | ORAL | Status: DC
Start: 2016-09-01 — End: 2016-09-03
  Administered 2016-09-01 – 2016-09-03 (×3): 10 mg via ORAL
  Filled 2016-09-01 (×3): qty 2

## 2016-09-01 MED ORDER — IRBESARTAN 300 MG PO TABS
300.0000 mg | ORAL_TABLET | Freq: Every day | ORAL | Status: DC
Start: 2016-09-01 — End: 2016-09-03
  Administered 2016-09-01 – 2016-09-03 (×3): 300 mg via ORAL
  Filled 2016-09-01 (×3): qty 1

## 2016-09-01 MED ORDER — METRONIDAZOLE IN NACL 5-0.79 MG/ML-% IV SOLN
500.0000 mg | Freq: Three times a day (TID) | INTRAVENOUS | Status: DC
  Administered 2016-09-01 – 2016-09-03 (×5): 500 mg via INTRAVENOUS
  Filled 2016-09-01 (×5): qty 100

## 2016-09-01 MED ORDER — INSULIN GLARGINE 100 UNIT/ML ~~LOC~~ SOLN
10.0000 [IU] | Freq: Every day | SUBCUTANEOUS | Status: DC
  Administered 2016-09-01 – 2016-09-03 (×3): 10 [IU] via SUBCUTANEOUS
  Filled 2016-09-01 (×3): qty 0.1

## 2016-09-01 NOTE — Progress Notes (Signed)
Inpatient Diabetes Program Recommendations  AACE/ADA: New Consensus Statement on Inpatient Glycemic Control (2015)  Target Ranges:  Prepandial:   less than 140 mg/dL      Peak postprandial:   less than 180 mg/dL (1-2 hours)      Critically ill patients:  140 - 180 mg/dL   Lab Results  Component Value Date   GLUCAP 218 (H) 09/01/2016   HGBA1C 8.0 06/01/2016   Review of Glycemic Control  Diabetes history: DM 2 Outpatient Diabetes medications: NPH 50 units, Metformin 1000 mg BID Current orders for Inpatient glycemic control: Lantus 10 units, Novolog Moderate 0-15 units tid  Inpatient Diabetes Program Recommendations:    Referral for patient being in the doughnut hole and inquire about optimal DM medications. Patient sees Dr. Loanne Drilling, Endocrinologist, outpatient for DM management. Patient was taking Levemir 70 units and Metformin 1000 mg BID. Due to cost and insurance patient was unable to afford Levemir, he contacted Dr. Loanne Drilling who told him to get [employer] Novolin N (NPH)($25/Vial), not 70/30 insulin, instructed him to take 50 units to start and call back with his glucose trends.  This is the best/cheapest option for patient and for him to stay in contact with Dr. Cordelia Pen office with trends and titration needs.  Will follow trends while here.  Thanks,  Tama Headings RN, MSN, [facility] Inpatient Diabetes Coordinator Team Pager [phone] (8a-5p)

## 2016-09-01 NOTE — Care Management Note (Signed)
Case Management Note  Patient Details  Name: Christopher Patel MRN: [id number] Date of Birth: Mar 03, 1945  Subjective/Objective:  Pt admitted with RUQ pain. He is from home alone.                  Action/Plan: Plan is for patient to return home when medically ready. CM following for d/c needs, physician orders.   Expected Discharge Date:                  Expected Discharge Plan:  Home/Self Care  In-House Referral:     Discharge planning Services     Post Acute Care Choice:    Choice offered to:     DME Arranged:    DME Agency:     HH Arranged:    HH Agency:     Status of Service:  In process, will continue to follow  If discussed at Long Length of Stay Meetings, dates discussed:    Additional Comments:  Pollie Friar, RN 09/01/2016, 3:48 PM

## 2016-09-01 NOTE — Consult Note (Signed)
[facility] CONSULT Reason for consult:abdominal pain and elevated LFTs Referring Physician: [facility]. PCP: Dr Dorthy Cooler. Primary G.I.: Dr. Darlin Priestly Christopher Patel is an 71 y.o. male.  HPI: he is followed routinely for a history of colon polyps with colonoscopies every 5 years. His last colonoscopy was 2015. He is not known to have gallstones, has never had any type of gallbladder symptoms in the past. He had MRI 2016 for follow-up adrenal lesion and no gallstones are biliary abnormalities were noted at that time.he has left adrenal adenoma that was stable and hemangioma deliver that was also state. He has known fatty liver from previous CT scans. Lab work during physical about a year ago reveal completely normal LFTs. The patient was feeling fine yesterday.he states that he ate sausage and biscuitsfor breakfast and pizza for lunchand felt fine was quite hungry. Later that afternoon he developed severe diffuse abdominal pain and nausea with bloating. He always has pain in his back did not note that the pain actually went to his back. He was evaluated in the emergency room and CT scan showed probable gallstones with no dilated ducts.descending: slightly thick.the patient's labs will remarkable during the night for marked elevation of LFTs around 600 AST and ALT with normal AP TB 2.8.WBC was 14.5.the patient is being treated with Protonix and Zosyn. He states that the pain came on very suddenly and since he has been in the hospital his completely resolved. He is hungry. His last bowel movement was yesterday morning but he has passed gas this morning.  Past Medical History:  Diagnosis Date  . CAD (coronary artery disease)   . Change in voice   . Cough   . Depression   . Diabetes mellitus 04-25-11   oral meds  . ED (erectile dysfunction)   . Hepatic lesion    left lobe  . Hyperlipidemia   . Hypertension    dr Marilynn Rail  . Hypothyroidism   . Lung nodule   . Nasal  congestion   . Osteoarthritis 04-25-11   spine area-some neck issues  . Paroxysmal atrial fibrillation (HCC)    started on metoprolol and Xarelto 07/02/2015 for afib RVR  . Pneumonia   . PONV (postoperative nausea and vomiting)   . Thyroid disease    thyroid lesion  . Tubular adenoma    polyps- Dr.Eulis Salazar    Past Surgical History:  Procedure Laterality Date  . CARDIAC CATHETERIZATION  04-25-11   many yrs ago-very slight blockage 1 vessel (60% RCA by Dr. Dorthy Cooler notes)  . EYE SURGERY  11/2010   cataract x2  . EYE SURGERY  12/2010   len implant x2   . KNEE SURGERY  04-25-11   right/ left knee scope  . THYROIDECTOMY  05/02/2011   Procedure: THYROIDECTOMY;  Surgeon: Earnstine Regal, MD;  Location: [facility];  Service: General;  Laterality: N/A;  Total Thyroidectomy  . TOTAL KNEE ARTHROPLASTY Right 04/25/2013   Procedure: TOTAL KNEE ARTHROPLASTY;  Surgeon: Kerin Salen, MD;  Location: [facility];  Service: Orthopedics;  Laterality: Right;    Family History  Problem Relation Age of Onset  . Diabetes Mother   . Diabetes Brother   . Colon cancer Brother   . COPD Other   . Hypertension Other   . Hyperlipidemia Other   . Heart attack Other   . Diabetes Sister     Social History:  reports that he quit smoking about 6 years ago. He quit after 50.00  years of use. He has never used smokeless tobacco. He reports that he drinks alcohol. He reports that he does not use drugs.  Allergies:  Allergies  Allergen Reactions  . Actos [Pioglitazone Hydrochloride] Nausea Only  . Glyburide Other (See Comments)    hypoglycemia  . Metformin And Related Other (See Comments)    Hypoglycemia, shakes Fast acting metformin  . Codeine Itching    Medications; Prior to Admission medications   Medication Sig Start Date End Date Taking? Authorizing Provider  acetaminophen (TYLENOL) 500 MG tablet Take 500 mg by mouth every 6 (six) hours as needed for moderate pain (BACK PAIN).   Yes [provider]   amLODipine (NORVASC) 10 MG tablet Take 10 mg by mouth daily.   Yes [provider]  aspirin EC 81 MG tablet Take 81 mg by mouth daily.   Yes [provider]  atorvastatin (LIPITOR) 80 MG tablet Take 40 mg by mouth at bedtime.   Yes [provider]  fexofenadine (ALLEGRA) 180 MG tablet Take 180 mg by mouth daily as needed. Allergies    Yes [provider]  fluticasone (VERAMYST) 27.5 MCG/SPRAY nasal spray Place 2 sprays into the nose daily as needed for allergies.    Yes [provider]  hydrochlorothiazide (HYDRODIURIL) 25 MG tablet Take 25 mg by mouth daily.   Yes [provider]  irbesartan (AVAPRO) 300 MG tablet Take 300 mg by mouth daily.   Yes [provider]  levothyroxine (SYNTHROID, LEVOTHROID) 150 MCG tablet Take 150 mcg by mouth daily before breakfast.   Yes [provider]  metFORMIN (GLUCOPHAGE-XR) 500 MG 24 hr tablet Take 1,000 mg by mouth 2 (two) times daily.   Yes [provider]  metoprolol tartrate (LOPRESSOR) 25 MG tablet Take 1 tablet (25 mg total) by mouth 2 (two) times daily. 07/31/16  Yes Jerline Pain, MD  Multiple Vitamin (MULTIVITAMIN) tablet Take 1 tablet by mouth daily.   Yes [provider]  pantoprazole (PROTONIX) 40 MG tablet Take 40 mg by mouth daily.   Yes [provider]   . amLODipine  10 mg Oral Daily  . aspirin EC  81 mg Oral Daily  . atorvastatin  40 mg Oral QHS  . enoxaparin (LOVENOX) injection  40 mg Subcutaneous Q24H  . insulin aspart  0-15 Units Subcutaneous TID WC  . insulin glargine  10 Units Subcutaneous Daily  . irbesartan  300 mg Oral Daily  . levothyroxine  150 mcg Oral QAC breakfast  . metoprolol tartrate  25 mg Oral BID  . multivitamin with minerals  1 tablet Oral Daily  . pantoprazole  40 mg Oral Daily   PRN Meds acetaminophen, fluticasone, HYDROmorphone (DILAUDID) injection, ondansetron **OR** ondansetron (ZOFRAN) IV Results for orders  placed or performed during the hospital encounter of 08/31/16 (from the past 48 hour(s))  Lipase, blood     Status: Abnormal   Collection Time: 08/31/16  6:16 PM  Result Value Ref Range   Lipase 91 (H) 11 - 51 U/L  Comprehensive metabolic panel     Status: Abnormal   Collection Time: 08/31/16  6:16 PM  Result Value Ref Range   Sodium 136 135 - 145 mmol/L   Potassium 3.8 3.5 - 5.1 mmol/L   Chloride 97 (L) 101 - 111 mmol/L   CO2 28 22 - 32 mmol/L   Glucose, Bld 221 (H) 65 - 99 mg/dL   BUN 11 6 - 20 mg/dL   Creatinine, Ser 1.07 0.61 -  1.24 mg/dL   Calcium 9.0 8.9 - 10.3 mg/dL   Total Protein 6.8 6.5 - 8.1 g/dL   Albumin 4.1 3.5 - 5.0 g/dL   AST 360 (H) 15 - 41 U/L   ALT 262 (H) 17 - 63 U/L   Alkaline Phosphatase 82 38 - 126 U/L   Total Bilirubin 2.0 (H) 0.3 - 1.2 mg/dL   GFR calc non Af Amer >60 >60 mL/min   GFR calc Af Amer >60 >60 mL/min    Comment: (NOTE) The eGFR has been calculated using the CKD EPI equation. This calculation has not been validated in all clinical situations. eGFR's persistently <60 mL/min signify possible Chronic Kidney Disease.    Anion gap 11 5 - 15  CBC     Status: Abnormal   Collection Time: 08/31/16  6:16 PM  Result Value Ref Range   WBC 14.5 (H) 4.0 - 10.5 K/uL   RBC 4.81 4.22 - 5.81 MIL/uL   Hemoglobin 14.3 13.0 - 17.0 g/dL   HCT 42.4 39.0 - 52.0 %   MCV 88.1 78.0 - 100.0 fL   MCH 29.7 26.0 - 34.0 pg   MCHC 33.7 30.0 - 36.0 g/dL   RDW 13.9 11.5 - 15.5 %   Platelets 214 150 - 400 K/uL  I-Stat CG4 Lactic Acid, ED     Status: Abnormal   Collection Time: 08/31/16  7:27 PM  Result Value Ref Range   Lactic Acid, Venous 4.16 (HH) 0.5 - 1.9 mmol/L   Comment NOTIFIED PHYSICIAN   Urinalysis, Routine w reflex microscopic     Status: Abnormal   Collection Time: 08/31/16  8:24 PM  Result Value Ref Range   Color, Urine YELLOW YELLOW   APPearance CLEAR CLEAR   Specific Gravity, Urine 1.015 1.005 - 1.030   pH 5.0 5.0 - 8.0   Glucose, UA 50 (A)  NEGATIVE mg/dL   Hgb urine dipstick NEGATIVE NEGATIVE   Bilirubin Urine NEGATIVE NEGATIVE   Ketones, ur NEGATIVE NEGATIVE mg/dL   Protein, ur NEGATIVE NEGATIVE mg/dL   Nitrite NEGATIVE NEGATIVE   Leukocytes, UA NEGATIVE NEGATIVE  I-Stat CG4 Lactic Acid, ED     Status: Abnormal   Collection Time: 08/31/16 10:37 PM  Result Value Ref Range   Lactic Acid, Venous 2.87 (HH) 0.5 - 1.9 mmol/L   Comment NOTIFIED PHYSICIAN   Comprehensive metabolic panel     Status: Abnormal   Collection Time: 09/01/16  2:53 AM  Result Value Ref Range   Sodium 136 135 - 145 mmol/L   Potassium 3.6 3.5 - 5.1 mmol/L   Chloride 99 (L) 101 - 111 mmol/L   CO2 26 22 - 32 mmol/L   Glucose, Bld 219 (H) 65 - 99 mg/dL   BUN 10 6 - 20 mg/dL   Creatinine, Ser 1.11 0.61 - 1.24 mg/dL   Calcium 7.9 (L) 8.9 - 10.3 mg/dL   Total Protein 5.5 (L) 6.5 - 8.1 g/dL   Albumin 3.2 (L) 3.5 - 5.0 g/dL   AST 594 (H) 15 - 41 U/L   ALT 564 (H) 17 - 63 U/L   Alkaline Phosphatase 67 38 - 126 U/L   Total Bilirubin 2.8 (H) 0.3 - 1.2 mg/dL   GFR calc non Af Amer >60 >60 mL/min   GFR calc Af Amer >60 >60 mL/min    Comment: (NOTE) The eGFR has been calculated using the CKD EPI equation. This calculation has not been validated in all clinical situations. eGFR's persistently <60 mL/min signify possible  Chronic Kidney Disease.    Anion gap 11 5 - 15  Glucose, capillary     Status: Abnormal   Collection Time: 09/01/16  6:01 AM  Result Value Ref Range   Glucose-Capillary 218 (H) 65 - 99 mg/dL  Lactic acid, plasma     Status: Abnormal   Collection Time: 09/01/16  9:29 AM  Result Value Ref Range   Lactic Acid, Venous 2.0 (HH) 0.5 - 1.9 mmol/L    Comment: CRITICAL RESULT CALLED TO, READ BACK BY AND VERIFIED WITH: IJAOLE,O RN @ 1027 09/01/16 LEONARD,A     Ct Abdomen Pelvis W Contrast  Result Date: 08/31/2016 CLINICAL DATA:  Right lower quadrant pain and nausea beginning this morning. EXAM: CT ABDOMEN AND PELVIS WITH CONTRAST TECHNIQUE:  Multidetector CT imaging of the abdomen and pelvis was performed using the standard protocol following bolus administration of intravenous contrast. CONTRAST:  113m ISOVUE-300 IOPAMIDOL (ISOVUE-300) INJECTION 61% COMPARISON:  04/27/2013 CT, MRI 06/17/2014 FINDINGS: Lower chest: The included heart is normal in size without pericardial effusion. There is bibasilar dependent atelectasis. Hepatobiliary: Hepatic steatosis with uncomplicated cholelithiasis. No biliary dilatation. Hemangioma seen on MRI is not apparent on current exam. This could be due timing of contrast. Pancreas: Unremarkable. No pancreatic ductal dilatation or surrounding inflammatory changes. Spleen: Normal in size without focal abnormality. Adrenals/Urinary Tract: Hypodense left anterior limb adrenal nodule measuring 1.4 x 1.6 x 1.3 cm is stable and consistent with an adenoma based on low Hounsfield unit of 25 post contrast CT. Right adrenal gland is unremarkable. The kidneys demonstrate symmetric enhancement without obstructive uropathy. The urinary bladder is physiologic. Stomach/Bowel: Slight thickening of the ascending colon with submucosal fatty deposition. Findings may reflect stigmata of inflammatory bowel disease or mild colitis. Normal-appearing appendix. The stomach and small intestine are nonacute. Next Vascular/Lymphatic: Moderate-to-marked atherosclerotic calcifications of the abdominal aorta extending into the common iliac arteries and their branch vessels. Mild branch vessel atherosclerosis off the aorta is noted involving the SMA. No adenopathy. Reproductive: Normal size prostate and seminal vesicles. Calcifications of the vas deferens may be seen with diabetes. Other: No abdominal wall hernia or abnormality. No abdominopelvic ascites. Musculoskeletal: Degenerative changes are seen along the dorsal spine with small anterior osteophytes of the included lower thoracic spine and to a lesser degree the lumbar spine. Slight straightening  of lumbar lordosis. Mild degenerate facet hypertrophy is identified of the lumbar facets. No suspicious osseous abnormality. IMPRESSION: 1. Slight thickened appearance of the ascending colon with submucosal fat deposition weight represent stigmata of inflammatory bowel. A mild colitis is not entirely excluded. 2. No bowel obstruction is noted. 3. Stable left adrenal adenoma measuring 1.4 x 1.6 x 1.3 cm. 4. Mild hepatic steatosis. 5. Moderate aortoiliac and branch vessel atherosclerosis. Electronically Signed   By: DAshley RoyaltyM.D.   On: 08/31/2016 21:12   Dg Abd Acute W/chest  Result Date: 08/31/2016 CLINICAL DATA:  Abdominal pain EXAM: DG ABDOMEN ACUTE W/ 1V CHEST COMPARISON:  Chest x-ray   07/02/2015 FINDINGS: Right lung clear. Opacity left lung base is probably atelectatic although pneumonia cannot be completely excluded. The cardiopericardial silhouette is within normal limits for size. The visualized bony structures of the thorax are intact. Upright film shows no evidence for intraperitoneal free air. There is no evidence for gaseous bowel dilation to suggest obstruction. Visualized bony anatomy is unremarkable. IMPRESSION: 1. Left base opacity is probably atelectasis but pneumonia cannot be completely excluded. 2. No evidence for bowel perforation or obstruction. Electronically Signed   By: ERandall Hiss  Tery Sanfilippo M.D.   On: 08/31/2016 20:13              Blood pressure 116/87, pulse 85, temperature 98.1 F (36.7 C), temperature source Oral, resp. rate 18, height _0  (1.778 m), weight 96.6 kg (213 lb), SpO2 94 %.  Physical exam:   General--talkative somewhat chubby white male in no distress ENT--nonicteric Neck--supple Heart--regular rate and rhythm without murmurs are gallops Lungs--clear Abdomen--obese but nondistended with excellent bowel sounds. Nontender Psych--mood and affect her normal. Alert and oriented answers questions appropriately   Assessment: 1. Acute abdominal  pain/abnormal LFTs. This most likely is due to biliary colic. His symptoms appear to have completely resolved. 2. Type II diabetes 3. History of Afib. He was briefly treated with Xarelto in the past but the episode was transient and never recurred and after some time Xarelto was stopped. 4. History of hepatic steatosis. This is chronic and he also has an  Hemangioma of the liver by MRI but liver test in the past have been normal  Plan: 1.most likely explanation is that he had a gallbladder attack. We will obtain ultrasound to evaluate his gallbladder further and follow his LFTs. If clear gallstones demonstrated he may need surgical referral. We will follow.   Aniketh Huberty JR,Tinzlee Craker L 09/01/2016, 10:54 AM   This note was created using voice recognition software and minor errors may Have occurred unintentionally. Pager: [phone] If no answer or after hours call [phone]

## 2016-09-01 NOTE — Progress Notes (Signed)
Pt has critical lactic acid 2.0. MD notified

## 2016-09-01 NOTE — Progress Notes (Signed)
Patient seen and examined at bedside, patient admitted after midnight, please see earlier detailed admission note by Etta Quill, DO. Briefly, patient presented with abdominal pain which has improved with concern for colitis and +/- pneumonia. AST/ALT elevated. [facility] consulted.   Cordelia Poche, MD [facility] 09/01/2016, 10:24 AM Pager: [phone]

## 2016-09-01 NOTE — Progress Notes (Signed)
  PHARMACY - PHYSICIAN COMMUNICATION CRITICAL VALUE ALERT - BLOOD CULTURE IDENTIFICATION (BCID)  Results for orders placed or performed during the hospital encounter of 08/31/16  Blood Culture ID Panel (Reflexed) (Collected: 08/31/2016  7:14 PM)  Result Value Ref Range   Enterococcus species NOT DETECTED NOT DETECTED   Vancomycin resistance NOT DETECTED NOT DETECTED   Listeria monocytogenes NOT DETECTED NOT DETECTED   Staphylococcus species NOT DETECTED NOT DETECTED   Staphylococcus aureus NOT DETECTED NOT DETECTED   Methicillin resistance NOT DETECTED NOT DETECTED   Streptococcus species NOT DETECTED NOT DETECTED   Streptococcus agalactiae NOT DETECTED NOT DETECTED   Streptococcus pneumoniae NOT DETECTED NOT DETECTED   Streptococcus pyogenes NOT DETECTED NOT DETECTED   Acinetobacter baumannii NOT DETECTED NOT DETECTED   Enterobacteriaceae species DETECTED (A) NOT DETECTED   Enterobacter cloacae complex NOT DETECTED NOT DETECTED   Escherichia coli DETECTED (A) NOT DETECTED   Klebsiella oxytoca NOT DETECTED NOT DETECTED   Klebsiella pneumoniae NOT DETECTED NOT DETECTED   Proteus species NOT DETECTED NOT DETECTED   Serratia marcescens NOT DETECTED NOT DETECTED   Carbapenem resistance NOT DETECTED NOT DETECTED   Haemophilus influenzae NOT DETECTED NOT DETECTED   Neisseria meningitidis NOT DETECTED NOT DETECTED   Pseudomonas aeruginosa NOT DETECTED NOT DETECTED   Candida albicans NOT DETECTED NOT DETECTED   Candida glabrata NOT DETECTED NOT DETECTED   Candida krusei NOT DETECTED NOT DETECTED   Candida parapsilosis NOT DETECTED NOT DETECTED   Candida tropicalis NOT DETECTED NOT DETECTED    Name of physician (or Provider) Contacted: Nettey  Changes to prescribed antibiotics required: D/c Zosyn and change to Rocephin + Flagyl (MD still wants anaerobic coverage for now)  Lawson Radar 09/01/2016  1:26 PM

## 2016-09-01 NOTE — Progress Notes (Signed)
Pt c/o increase cough with red tinged sputum. Reports increase headache

## 2016-09-01 NOTE — H&P (Signed)
History and Physical    Christopher Patel VZC:[id number] DOB: Feb 09, 1945 DOA: 08/31/2016  PCP: Lujean Amel, MD  Patient coming from: Home  I have personally briefly reviewed patient's old medical records in [place]  Chief Complaint: ABD pain  HPI: Christopher Patel is a 71 y.o. male with medical history significant of DM2, HTN.  Patient presents to the ED with c/o diffuse abdominal pain.  Symptoms onset this AM about 10am, this was 1 hour after eating breakfast which included Eggs, sausage, toast, and pizza.  No diarrhea, constipation, nor vomiting, does have nausea.  Went to UC who sent him to ED.  No prior abd surgeries.  No known gallbladder issues though his dad had gallbladder removed.   ED Course: LFTs in the 300s, bili 2, lactate 4.1 improves to 2.x after IVF.  Tm 100.0, WBC 14.5k.  Patient started on zosyn.   Review of Systems: As per HPI otherwise 10 point review of systems negative.   Past Medical History:  Diagnosis Date  . CAD (coronary artery disease)   . Change in voice   . Cough   . Depression   . Diabetes mellitus 04-25-11   oral meds  . ED (erectile dysfunction)   . Hepatic lesion    left lobe  . Hyperlipidemia   . Hypertension    dr Marilynn Rail  . Hypothyroidism   . Lung nodule   . Nasal congestion   . Osteoarthritis 04-25-11   spine area-some neck issues  . Paroxysmal atrial fibrillation (HCC)    started on metoprolol and Xarelto 07/02/2015 for afib RVR  . Pneumonia   . PONV (postoperative nausea and vomiting)   . Thyroid disease    thyroid lesion  . Tubular adenoma    polyps- Dr.Edwards    Past Surgical History:  Procedure Laterality Date  . CARDIAC CATHETERIZATION  04-25-11   many yrs ago-very slight blockage 1 vessel (60% RCA by Dr. Dorthy Cooler notes)  . EYE SURGERY  11/2010   cataract x2  . EYE SURGERY  12/2010   len implant x2   . KNEE SURGERY  04-25-11   right/ left knee scope  . THYROIDECTOMY  05/02/2011   Procedure: THYROIDECTOMY;   Surgeon: Earnstine Regal, MD;  Location: [facility];  Service: General;  Laterality: N/A;  Total Thyroidectomy  . TOTAL KNEE ARTHROPLASTY Right 04/25/2013   Procedure: TOTAL KNEE ARTHROPLASTY;  Surgeon: Kerin Salen, MD;  Location: [facility];  Service: Orthopedics;  Laterality: Right;     reports that he quit smoking about 6 years ago. He quit after 50.00 years of use. He has never used smokeless tobacco. He reports that he drinks alcohol. He reports that he does not use drugs.  Allergies  Allergen Reactions  . Actos [Pioglitazone Hydrochloride] Nausea Only  . Glyburide Other (See Comments)    hypoglycemia  . Metformin And Related Other (See Comments)    Hypoglycemia, shakes Fast acting metformin  . Codeine Itching    Family History  Problem Relation Age of Onset  . Diabetes Mother   . Diabetes Brother   . Colon cancer Brother   . COPD Other   . Hypertension Other   . Hyperlipidemia Other   . Heart attack Other   . Diabetes Sister      Prior to Admission medications   Medication Sig Start Date End Date Taking? Authorizing Provider  acetaminophen (TYLENOL) 500 MG tablet Take 500 mg by mouth every 6 (six) hours  as needed for moderate pain (BACK PAIN).   Yes [provider]  amLODipine (NORVASC) 10 MG tablet Take 10 mg by mouth daily.   Yes [provider]  aspirin EC 81 MG tablet Take 81 mg by mouth daily.   Yes [provider]  atorvastatin (LIPITOR) 80 MG tablet Take 40 mg by mouth at bedtime.   Yes [provider]  fexofenadine (ALLEGRA) 180 MG tablet Take 180 mg by mouth daily as needed. Allergies    Yes [provider]  fluticasone (VERAMYST) 27.5 MCG/SPRAY nasal spray Place 2 sprays into the nose daily as needed for allergies.    Yes [provider]  hydrochlorothiazide (HYDRODIURIL) 25 MG tablet Take 25 mg by mouth daily.   Yes [provider]  irbesartan (AVAPRO) 300 MG tablet Take 300 mg by mouth daily.   Yes  [provider]  levothyroxine (SYNTHROID, LEVOTHROID) 150 MCG tablet Take 150 mcg by mouth daily before breakfast.   Yes [provider]  metFORMIN (GLUCOPHAGE-XR) 500 MG 24 hr tablet Take 1,000 mg by mouth 2 (two) times daily.   Yes [provider]  metoprolol tartrate (LOPRESSOR) 25 MG tablet Take 1 tablet (25 mg total) by mouth 2 (two) times daily. 07/31/16  Yes Jerline Pain, MD  Multiple Vitamin (MULTIVITAMIN) tablet Take 1 tablet by mouth daily.   Yes [provider]  pantoprazole (PROTONIX) 40 MG tablet Take 40 mg by mouth daily.   Yes [provider]    Physical Exam: Vitals:   08/31/16 2300 08/31/16 2315 08/31/16 2345 09/01/16 0030  BP: 131/67 124/73 (!) 118/58 119/66  Pulse: (!) 105 (!) 103 (!) 101 93  Resp: (!) 26 (!) 24 (!) 25 18  Temp:      TempSrc:      SpO2: 93% 93% 94% 95%  Weight:      Height:        Constitutional: NAD, calm, comfortable Eyes: PERRL, lids and conjunctivae normal ENMT: Mucous membranes are moist. Posterior pharynx clear of any exudate or lesions.Normal dentition.  Neck: normal, supple, no masses, no thyromegaly Respiratory: clear to auscultation bilaterally, no wheezing, no crackles. Normal respiratory effort. No accessory muscle use.  Cardiovascular: Regular rate and rhythm, no murmurs / rubs / gallops. No extremity edema. 2+ pedal pulses. No carotid bruits.  Abdomen: no tenderness, no masses palpated. No hepatosplenomegaly. Bowel sounds positive.  Musculoskeletal: no clubbing / cyanosis. No joint deformity upper and lower extremities. Good ROM, no contractures. Normal muscle tone.  Skin: no rashes, lesions, ulcers. No induration Neurologic: CN 2-12 grossly intact. Sensation intact, DTR normal. Strength 5/5 in all 4.  Psychiatric: Normal judgment and insight. Alert and oriented x 3. Normal mood.    Labs on Admission: I have personally reviewed following labs and imaging studies  CBC:  Recent  Labs Lab 08/31/16 1816  WBC 14.5*  HGB 14.3  HCT 42.4  MCV 88.1  PLT 505   Basic Metabolic Panel:  Recent Labs Lab 08/31/16 1816  NA 136  K 3.8  CL 97*  CO2 28  GLUCOSE 221*  BUN 11  CREATININE 1.07  CALCIUM 9.0   GFR: Estimated Creatinine Clearance: 75.9 mL/min (by C-G formula based on SCr of 1.07 mg/dL). Liver Function Tests:  Recent Labs Lab 08/31/16 1816  AST 360*  ALT 262*  ALKPHOS 82  BILITOT 2.0*  PROT 6.8  ALBUMIN 4.1    Recent Labs Lab 08/31/16 1816  LIPASE 91*   No results  for input(s): AMMONIA in the last 168 hours. Coagulation Profile: No results for input(s): INR, PROTIME in the last 168 hours. Cardiac Enzymes: No results for input(s): CKTOTAL, CKMB, CKMBINDEX, TROPONINI in the last 168 hours. BNP (last 3 results) No results for input(s): PROBNP in the last 8760 hours. HbA1C: No results for input(s): HGBA1C in the last 72 hours. CBG: No results for input(s): GLUCAP in the last 168 hours. Lipid Profile: No results for input(s): CHOL, HDL, LDLCALC, TRIG, CHOLHDL, LDLDIRECT in the last 72 hours. Thyroid Function Tests: No results for input(s): TSH, T4TOTAL, FREET4, T3FREE, THYROIDAB in the last 72 hours. Anemia Panel: No results for input(s): VITAMINB12, FOLATE, FERRITIN, TIBC, IRON, RETICCTPCT in the last 72 hours. Urine analysis:    Component Value Date/Time   COLORURINE YELLOW 08/31/2016 2024   APPEARANCEUR CLEAR 08/31/2016 2024   LABSPEC 1.015 08/31/2016 2024   PHURINE 5.0 08/31/2016 2024   GLUCOSEU 50 (A) 08/31/2016 2024   HGBUR NEGATIVE 08/31/2016 2024   BILIRUBINUR NEGATIVE 08/31/2016 2024   KETONESUR NEGATIVE 08/31/2016 2024   PROTEINUR NEGATIVE 08/31/2016 2024   UROBILINOGEN 0.2 04/26/2013 2235   NITRITE NEGATIVE 08/31/2016 2024   LEUKOCYTESUR NEGATIVE 08/31/2016 2024    Radiological Exams on Admission: Ct Abdomen Pelvis W Contrast  Result Date: 08/31/2016 CLINICAL DATA:  Right lower quadrant pain and nausea beginning  this morning. EXAM: CT ABDOMEN AND PELVIS WITH CONTRAST TECHNIQUE: Multidetector CT imaging of the abdomen and pelvis was performed using the standard protocol following bolus administration of intravenous contrast. CONTRAST:  150mL ISOVUE-300 IOPAMIDOL (ISOVUE-300) INJECTION 61% COMPARISON:  04/27/2013 CT, MRI 06/17/2014 FINDINGS: Lower chest: The included heart is normal in size without pericardial effusion. There is bibasilar dependent atelectasis. Hepatobiliary: Hepatic steatosis with uncomplicated cholelithiasis. No biliary dilatation. Hemangioma seen on MRI is not apparent on current exam. This could be due timing of contrast. Pancreas: Unremarkable. No pancreatic ductal dilatation or surrounding inflammatory changes. Spleen: Normal in size without focal abnormality. Adrenals/Urinary Tract: Hypodense left anterior limb adrenal nodule measuring 1.4 x 1.6 x 1.3 cm is stable and consistent with an adenoma based on low Hounsfield unit of 25 post contrast CT. Right adrenal gland is unremarkable. The kidneys demonstrate symmetric enhancement without obstructive uropathy. The urinary bladder is physiologic. Stomach/Bowel: Slight thickening of the ascending colon with submucosal fatty deposition. Findings may reflect stigmata of inflammatory bowel disease or mild colitis. Normal-appearing appendix. The stomach and small intestine are nonacute. Next Vascular/Lymphatic: Moderate-to-marked atherosclerotic calcifications of the abdominal aorta extending into the common iliac arteries and their branch vessels. Mild branch vessel atherosclerosis off the aorta is noted involving the SMA. No adenopathy. Reproductive: Normal size prostate and seminal vesicles. Calcifications of the vas deferens may be seen with diabetes. Other: No abdominal wall hernia or abnormality. No abdominopelvic ascites. Musculoskeletal: Degenerative changes are seen along the dorsal spine with small anterior osteophytes of the included lower thoracic  spine and to a lesser degree the lumbar spine. Slight straightening of lumbar lordosis. Mild degenerate facet hypertrophy is identified of the lumbar facets. No suspicious osseous abnormality. IMPRESSION: 1. Slight thickened appearance of the ascending colon with submucosal fat deposition weight represent stigmata of inflammatory bowel. A mild colitis is not entirely excluded. 2. No bowel obstruction is noted. 3. Stable left adrenal adenoma measuring 1.4 x 1.6 x 1.3 cm. 4. Mild hepatic steatosis. 5. Moderate aortoiliac and branch vessel atherosclerosis. Electronically Signed   By: Ashley Royalty M.D.   On: 08/31/2016 21:12   Dg Abd Acute W/chest  Result Date: 08/31/2016 CLINICAL DATA:  Abdominal pain EXAM: DG ABDOMEN ACUTE W/ 1V CHEST COMPARISON:  Chest x-ray   07/02/2015 FINDINGS: Right lung clear. Opacity left lung base is probably atelectatic although pneumonia cannot be completely excluded. The cardiopericardial silhouette is within normal limits for size. The visualized bony structures of the thorax are intact. Upright film shows no evidence for intraperitoneal free air. There is no evidence for gaseous bowel dilation to suggest obstruction. Visualized bony anatomy is unremarkable. IMPRESSION: 1. Left base opacity is probably atelectasis but pneumonia cannot be completely excluded. 2. No evidence for bowel perforation or obstruction. Electronically Signed   By: Misty Stanley M.D.   On: 08/31/2016 20:13    EKG: Independently reviewed.  Assessment/Plan Principal Problem:   RUQ abdominal pain Active Problems:   Essential hypertension, benign   Diabetes mellitus (HCC)   Transaminitis   SIRS (systemic inflammatory response syndrome) (HCC)   Lactic acidosis    1. RUQ abd pain - with transaminitis, SIRS and lactic acidosis.  Abd pain significantly improved at this time. 1. Suspicious for gallbladder colic / early cholecystitis 2. EDP called Dr. Oletta Lamas - he says he will consult in AM 3. Didn't ask  for any other tests at this point so will hold off on ordering 4. Will continue zosyn started in ED for now 5. IVF: 4L bolus and 125 cc/hr done in ED 6. Clear liquid diet 7. zofran PRN 8. Dilaudid PRN 9. Repeat CMP in AM 2. HTN - continue home meds but hold HCTZ 3. DM2 - 1. Hold metformin 2. Hold the insulin of type that we arnt sure of (patient thinks its 70/30) that he takes 60 units of once a day 3. Lantus 10 daily 4. Mod scale SSI AC 5. Diabetes coordinator consult ordered  DVT prophylaxis: Lovenox Code Status: Full Family Communication: No family in room Disposition Plan: Home after admit Consults called: Dr. Oletta Lamas with GI Admission status: Place in obs - may need conversion to inpatient however   Etta Quill DO [facility] Pager [phone]  If 7AM-7PM, please contact day team taking care of patient [email or web address] Password [email or web address]  09/01/2016, 12:59 AM

## 2016-09-02 ENCOUNTER — Inpatient Hospital Stay (HOSPITAL_COMMUNITY): Payer: Medicare HMO

## 2016-09-02 DIAGNOSIS — R7881 Bacteremia: Secondary | ICD-10-CM | POA: Diagnosis not present

## 2016-09-02 DIAGNOSIS — D3502 Benign neoplasm of left adrenal gland: Secondary | ICD-10-CM | POA: Diagnosis present

## 2016-09-02 DIAGNOSIS — E872 Acidosis: Secondary | ICD-10-CM | POA: Diagnosis present

## 2016-09-02 DIAGNOSIS — J189 Pneumonia, unspecified organism: Secondary | ICD-10-CM | POA: Diagnosis present

## 2016-09-02 DIAGNOSIS — Z885 Allergy status to narcotic agent status: Secondary | ICD-10-CM | POA: Diagnosis not present

## 2016-09-02 DIAGNOSIS — E876 Hypokalemia: Secondary | ICD-10-CM | POA: Diagnosis present

## 2016-09-02 DIAGNOSIS — Z888 Allergy status to other drugs, medicaments and biological substances status: Secondary | ICD-10-CM | POA: Diagnosis not present

## 2016-09-02 DIAGNOSIS — K529 Noninfective gastroenteritis and colitis, unspecified: Secondary | ICD-10-CM | POA: Diagnosis present

## 2016-09-02 DIAGNOSIS — E119 Type 2 diabetes mellitus without complications: Secondary | ICD-10-CM | POA: Diagnosis present

## 2016-09-02 DIAGNOSIS — I2510 Atherosclerotic heart disease of native coronary artery without angina pectoris: Secondary | ICD-10-CM | POA: Diagnosis present

## 2016-09-02 DIAGNOSIS — R740 Nonspecific elevation of levels of transaminase and lactic acid dehydrogenase [LDH]: Secondary | ICD-10-CM | POA: Diagnosis present

## 2016-09-02 DIAGNOSIS — K8020 Calculus of gallbladder without cholecystitis without obstruction: Secondary | ICD-10-CM

## 2016-09-02 DIAGNOSIS — B9620 Unspecified Escherichia coli [E. coli] as the cause of diseases classified elsewhere: Secondary | ICD-10-CM | POA: Diagnosis present

## 2016-09-02 DIAGNOSIS — Z96651 Presence of right artificial knee joint: Secondary | ICD-10-CM | POA: Diagnosis present

## 2016-09-02 DIAGNOSIS — I10 Essential (primary) hypertension: Secondary | ICD-10-CM | POA: Diagnosis present

## 2016-09-02 DIAGNOSIS — Z833 Family history of diabetes mellitus: Secondary | ICD-10-CM | POA: Diagnosis not present

## 2016-09-02 DIAGNOSIS — R1011 Right upper quadrant pain: Secondary | ICD-10-CM | POA: Diagnosis present

## 2016-09-02 DIAGNOSIS — Z86010 Personal history of colonic polyps: Secondary | ICD-10-CM | POA: Diagnosis not present

## 2016-09-02 DIAGNOSIS — I4891 Unspecified atrial fibrillation: Secondary | ICD-10-CM | POA: Diagnosis present

## 2016-09-02 DIAGNOSIS — Z8249 Family history of ischemic heart disease and other diseases of the circulatory system: Secondary | ICD-10-CM | POA: Diagnosis not present

## 2016-09-02 DIAGNOSIS — K760 Fatty (change of) liver, not elsewhere classified: Secondary | ICD-10-CM | POA: Diagnosis present

## 2016-09-02 DIAGNOSIS — A4151 Sepsis due to Escherichia coli [E. coli]: Secondary | ICD-10-CM | POA: Diagnosis present

## 2016-09-02 DIAGNOSIS — E890 Postprocedural hypothyroidism: Secondary | ICD-10-CM | POA: Diagnosis present

## 2016-09-02 DIAGNOSIS — Z87891 Personal history of nicotine dependence: Secondary | ICD-10-CM | POA: Diagnosis not present

## 2016-09-02 DIAGNOSIS — R945 Abnormal results of liver function studies: Secondary | ICD-10-CM | POA: Diagnosis present

## 2016-09-02 DIAGNOSIS — Z7984 Long term (current) use of oral hypoglycemic drugs: Secondary | ICD-10-CM | POA: Diagnosis not present

## 2016-09-02 LAB — GLUCOSE, CAPILLARY
Glucose-Capillary: 167 mg/dL — ABNORMAL HIGH (ref 65–99)
Glucose-Capillary: 245 mg/dL — ABNORMAL HIGH (ref 65–99)
Glucose-Capillary: 212 mg/dL — ABNORMAL HIGH (ref 65–99)
Glucose-Capillary: 245 mg/dL — ABNORMAL HIGH (ref 65–99)

## 2016-09-02 LAB — CBC WITH DIFFERENTIAL/PLATELET
Basophils Absolute: 0 10*3/uL (ref 0.0–0.1)
Basophils Relative: 0 %
EOS PCT: 0 %
Eosinophils Absolute: 0 10*3/uL (ref 0.0–0.7)
HCT: 36.5 % — ABNORMAL LOW (ref 39.0–52.0)
HEMOGLOBIN: 12.4 g/dL — AB (ref 13.0–17.0)
LYMPHS ABS: 0.7 10*3/uL (ref 0.7–4.0)
LYMPHS PCT: 6 %
MCH: 30.2 pg (ref 26.0–34.0)
MCHC: 34 g/dL (ref 30.0–36.0)
MCV: 89 fL (ref 78.0–100.0)
Monocytes Absolute: 0.7 10*3/uL (ref 0.1–1.0)
Monocytes Relative: 6 %
NEUTROS PCT: 88 %
Neutro Abs: 10.6 10*3/uL — ABNORMAL HIGH (ref 1.7–7.7)
Platelets: 139 10*3/uL — ABNORMAL LOW (ref 150–400)
RBC: 4.1 MIL/uL — AB (ref 4.22–5.81)
RDW: 14.8 % (ref 11.5–15.5)
WBC: 12.1 10*3/uL — AB (ref 4.0–10.5)

## 2016-09-02 LAB — COMPREHENSIVE METABOLIC PANEL
ALT: 582 U/L — AB (ref 17–63)
ANION GAP: 9 (ref 5–15)
AST: 349 U/L — ABNORMAL HIGH (ref 15–41)
Albumin: 3.2 g/dL — ABNORMAL LOW (ref 3.5–5.0)
Alkaline Phosphatase: 90 U/L (ref 38–126)
BUN: 8 mg/dL (ref 6–20)
CHLORIDE: 99 mmol/L — AB (ref 101–111)
CO2: 29 mmol/L (ref 22–32)
Calcium: 8.1 mg/dL — ABNORMAL LOW (ref 8.9–10.3)
Creatinine, Ser: 1.03 mg/dL (ref 0.61–1.24)
Glucose, Bld: 116 mg/dL — ABNORMAL HIGH (ref 65–99)
POTASSIUM: 3.2 mmol/L — AB (ref 3.5–5.1)
SODIUM: 137 mmol/L (ref 135–145)
Total Bilirubin: 3.2 mg/dL — ABNORMAL HIGH (ref 0.3–1.2)
Total Protein: 5.7 g/dL — ABNORMAL LOW (ref 6.5–8.1)

## 2016-09-02 MED ORDER — LORAZEPAM 2 MG/ML IJ SOLN: 1.0000 mg | Freq: Once | INTRAMUSCULAR | Status: DC

## 2016-09-02 MED ORDER — GADOBENATE DIMEGLUMINE 529 MG/ML IV SOLN
20.0000 mL | Freq: Once | INTRAVENOUS | Status: AC
  Administered 2016-09-02: 20 mL via INTRAVENOUS

## 2016-09-02 MED ORDER — LORAZEPAM 2 MG/ML IJ SOLN
INTRAMUSCULAR | Status: AC
  Administered 2016-09-02: 1 mg
  Filled 2016-09-02: qty 1

## 2016-09-02 MED ORDER — POTASSIUM CHLORIDE CRYS ER 20 MEQ PO TBCR
20.0000 meq | EXTENDED_RELEASE_TABLET | Freq: Once | ORAL | Status: AC
  Administered 2016-09-02: 20 meq via ORAL
  Filled 2016-09-02: qty 1

## 2016-09-02 MED ORDER — BENZONATATE 100 MG PO CAPS: 200.0000 mg | ORAL_CAPSULE | Freq: Three times a day (TID) | ORAL | Status: DC | PRN

## 2016-09-02 MED ORDER — LORAZEPAM 1 MG PO TABS: 1.0000 mg | ORAL_TABLET | Freq: Once | ORAL | Status: DC

## 2016-09-02 NOTE — Progress Notes (Signed)
PROGRESS NOTE    Christopher Patel  ESP:[id number] DOB: 1945-04-26 DOA: 08/31/2016 PCP: Lujean Amel, MD   Brief Narrative: Christopher Patel is a 71 y.o. neofissure diabetes mellitus type 2, hypertension, hypothyroidism. Patient presented with diffuse abdominal pain with concern for enteritis. He was started on broad-spectrum antibiotics. C. difficile and gastrointestinal panel were negative for infection. Blood cultures were obtained and have grown Escherichia coli. In addition, he has had elevated AST/ALT in addition to bilirubin. Imaging has been unremarkable for etiology. Gastroenterology has been consulted and is following   Assessment & Plan:   Principal Problem:   RUQ abdominal pain Active Problems:   Essential hypertension, benign   Diabetes mellitus (HCC)   Transaminitis   SIRS (systemic inflammatory response syndrome) (HCC)   Lactic acidosis   RUQ pain Resolved. Possibly secondary to biliary colic.  Elevated AST/ALT Elevated bilirubin AST trending down, however ALT and bilirubin trending up. Patient with some mild scleral icterus. -Patient will likely need MRCP, will await GI recommendations  Hypokalemia Potassium of 3.2 this morning. -Potassium supplementation  Sepsis Escherichia coli growing in blood. -Continue ceftriaxone -culture pending  Enteritis/colitis -Continue ceftriaxone/Flagyl  Hypothyroidism -continue Synthroid   DVT prophylaxis: Lovenox Code Status: Full code Family Communication: None at bedside Disposition Plan: Discharge likely in 24-48 hours   Consultants:   Gastroenterology  Procedures:   None  Antimicrobials:   Zosyn  Ceftriaxone  Metronidazole   Subjective: Febrile to 100.4 overnight.  Objective: Vitals:   09/01/16 2117 09/01/16 2138 09/02/16 0519 09/02/16 1050  BP: (!) 160/73  130/69 (!) 145/86  Pulse: 98  69 (!) 57  Resp: 18  19 18   Temp: (!) 100.4 F (38 C)  98.6 F (37 C) 98.6 F (37 C)  TempSrc:  Oral  Oral Oral  SpO2: 92% 96% 94% 94%  Weight:      Height:        Intake/Output Summary (Last 24 hours) at 09/02/16 1111 Last data filed at 09/02/16 0600  Gross per 24 hour  Intake              250 ml  Output             1200 ml  Net             -950 ml   Filed Weights   08/31/16 1806  Weight: 96.6 kg (213 lb)    Examination:  General exam: Appears calm and comfortable Respiratory system: Clear to auscultation. Respiratory effort normal. Cardiovascular system: S1 & S2 heard, RRR. No murmurs, rubs, gallops or clicks. Gastrointestinal system: Abdomen is nondistended, soft and nontender. No organomegaly or masses felt. Normal bowel sounds heard. Central nervous system: Alert and oriented. No focal neurological deficits. Extremities: No edema. No calf tenderness Skin: No cyanosis. No rashes Psychiatry: Judgement and insight appear normal. Mood & affect appropriate.     Data Reviewed: I have personally reviewed following labs and imaging studies  CBC:  Recent Labs Lab 08/31/16 1816 09/02/16 0007  WBC 14.5* 12.1*  NEUTROABS  --  10.6*  HGB 14.3 12.4*  HCT 42.4 36.5*  MCV 88.1 89.0  PLT 214 633*   Basic Metabolic Panel:  Recent Labs Lab 08/31/16 1816 09/01/16 0253 09/02/16 0007  NA 136 136 137  K 3.8 3.6 3.2*  CL 97* 99* 99*  CO2 28 26 29   GLUCOSE 221* 219* 116*  BUN 11 10 8   CREATININE 1.07 1.11 1.03  CALCIUM 9.0 7.9* 8.1*   GFR: Estimated  Creatinine Clearance: 78.9 mL/min (by C-G formula based on SCr of 1.03 mg/dL). Liver Function Tests:  Recent Labs Lab 08/31/16 1816 09/01/16 0253 09/02/16 0007  AST 360* 594* 349*  ALT 262* 564* 582*  ALKPHOS 82 67 90  BILITOT 2.0* 2.8* 3.2*  PROT 6.8 5.5* 5.7*  ALBUMIN 4.1 3.2* 3.2*    Recent Labs Lab 08/31/16 1816  LIPASE 91*   No results for input(s): AMMONIA in the last 168 hours. Coagulation Profile: No results for input(s): INR, PROTIME in the last 168 hours. Cardiac Enzymes: No results for  input(s): CKTOTAL, CKMB, CKMBINDEX, TROPONINI in the last 168 hours. BNP (last 3 results) No results for input(s): PROBNP in the last 8760 hours. HbA1C: No results for input(s): HGBA1C in the last 72 hours. CBG:  Recent Labs Lab 09/01/16 0601 09/01/16 1112 09/01/16 1633 09/01/16 2228 09/02/16 0626  GLUCAP 218* 193* 117* 117* 167*   Lipid Profile: No results for input(s): CHOL, HDL, LDLCALC, TRIG, CHOLHDL, LDLDIRECT in the last 72 hours. Thyroid Function Tests: No results for input(s): TSH, T4TOTAL, FREET4, T3FREE, THYROIDAB in the last 72 hours. Anemia Panel: No results for input(s): VITAMINB12, FOLATE, FERRITIN, TIBC, IRON, RETICCTPCT in the last 72 hours. Sepsis Labs:  Recent Labs Lab 08/31/16 1927 08/31/16 2237 09/01/16 0929  LATICACIDVEN 4.16* 2.87* 2.0*    Recent Results (from the past 240 hour(s))  Blood culture (routine x 2)     Status: Abnormal (Preliminary result)   Collection Time: 08/31/16  7:14 PM  Result Value Ref Range Status   Specimen Description BLOOD LEFT ANTECUBITAL  Final   Special Requests   Final    BOTTLES DRAWN AEROBIC AND ANAEROBIC Blood Culture adequate volume   Culture  Setup Time   Final    GRAM NEGATIVE RODS AEROBIC BOTTLE ONLY Organism ID to follow CRITICAL RESULT CALLED TO, READ BACK BY AND VERIFIED WITH: Alvino Chapel.D. 13:10 09/01/16 ([email or web address])    Culture ESCHERICHIA COLI SUSCEPTIBILITIES TO FOLLOW  (A)  Final   Report Status PENDING  Incomplete  Blood Culture ID Panel (Reflexed)     Status: Abnormal   Collection Time: 08/31/16  7:14 PM  Result Value Ref Range Status   Enterococcus species NOT DETECTED NOT DETECTED Final   Vancomycin resistance NOT DETECTED NOT DETECTED Final   Listeria monocytogenes NOT DETECTED NOT DETECTED Final   Staphylococcus species NOT DETECTED NOT DETECTED Final   Staphylococcus aureus NOT DETECTED NOT DETECTED Final   Methicillin resistance NOT DETECTED NOT DETECTED Final   Streptococcus species NOT  DETECTED NOT DETECTED Final   Streptococcus agalactiae NOT DETECTED NOT DETECTED Final   Streptococcus pneumoniae NOT DETECTED NOT DETECTED Final   Streptococcus pyogenes NOT DETECTED NOT DETECTED Final   Acinetobacter baumannii NOT DETECTED NOT DETECTED Final   Enterobacteriaceae species DETECTED (A) NOT DETECTED Final    Comment: Enterobacteriaceae represent a large family of gram-negative bacteria, not a single organism. CRITICAL RESULT CALLED TO, READ BACK BY AND VERIFIED WITH: Alvino Chapel.D. 13:10 09/01/16 ([email or web address])    Enterobacter cloacae complex NOT DETECTED NOT DETECTED Final   Escherichia coli DETECTED (A) NOT DETECTED Final    Comment: CRITICAL RESULT CALLED TO, READ BACK BY AND VERIFIED WITH: EDaisy Lazar.D. 13:10 09/01/08 ([email or web address])    Klebsiella oxytoca NOT DETECTED NOT DETECTED Final   Klebsiella pneumoniae NOT DETECTED NOT DETECTED Final   Proteus species NOT DETECTED NOT DETECTED Final   Serratia marcescens NOT DETECTED NOT DETECTED Final   Carbapenem resistance NOT DETECTED NOT  DETECTED Final   Haemophilus influenzae NOT DETECTED NOT DETECTED Final   Neisseria meningitidis NOT DETECTED NOT DETECTED Final   Pseudomonas aeruginosa NOT DETECTED NOT DETECTED Final   Candida albicans NOT DETECTED NOT DETECTED Final   Candida glabrata NOT DETECTED NOT DETECTED Final   Candida krusei NOT DETECTED NOT DETECTED Final   Candida parapsilosis NOT DETECTED NOT DETECTED Final   Candida tropicalis NOT DETECTED NOT DETECTED Final  Blood culture (routine x 2)     Status: None (Preliminary result)   Collection Time: 08/31/16  7:39 PM  Result Value Ref Range Status   Specimen Description BLOOD RIGHT HAND  Final   Special Requests   Final    BOTTLES DRAWN AEROBIC AND ANAEROBIC Blood Culture adequate volume   Culture NO GROWTH < 24 HOURS  Final   Report Status PENDING  Incomplete         Radiology Studies: Ct Abdomen Pelvis W Contrast  Result Date: 08/31/2016 CLINICAL  DATA:  Right lower quadrant pain and nausea beginning this morning. EXAM: CT ABDOMEN AND PELVIS WITH CONTRAST TECHNIQUE: Multidetector CT imaging of the abdomen and pelvis was performed using the standard protocol following bolus administration of intravenous contrast. CONTRAST:  142mL ISOVUE-300 IOPAMIDOL (ISOVUE-300) INJECTION 61% COMPARISON:  04/27/2013 CT, MRI 06/17/2014 FINDINGS: Lower chest: The included heart is normal in size without pericardial effusion. There is bibasilar dependent atelectasis. Hepatobiliary: Hepatic steatosis with uncomplicated cholelithiasis. No biliary dilatation. Hemangioma seen on MRI is not apparent on current exam. This could be due timing of contrast. Pancreas: Unremarkable. No pancreatic ductal dilatation or surrounding inflammatory changes. Spleen: Normal in size without focal abnormality. Adrenals/Urinary Tract: Hypodense left anterior limb adrenal nodule measuring 1.4 x 1.6 x 1.3 cm is stable and consistent with an adenoma based on low Hounsfield unit of 25 post contrast CT. Right adrenal gland is unremarkable. The kidneys demonstrate symmetric enhancement without obstructive uropathy. The urinary bladder is physiologic. Stomach/Bowel: Slight thickening of the ascending colon with submucosal fatty deposition. Findings may reflect stigmata of inflammatory bowel disease or mild colitis. Normal-appearing appendix. The stomach and small intestine are nonacute. Next Vascular/Lymphatic: Moderate-to-marked atherosclerotic calcifications of the abdominal aorta extending into the common iliac arteries and their branch vessels. Mild branch vessel atherosclerosis off the aorta is noted involving the SMA. No adenopathy. Reproductive: Normal size prostate and seminal vesicles. Calcifications of the vas deferens may be seen with diabetes. Other: No abdominal wall hernia or abnormality. No abdominopelvic ascites. Musculoskeletal: Degenerative changes are seen along the dorsal spine with small  anterior osteophytes of the included lower thoracic spine and to a lesser degree the lumbar spine. Slight straightening of lumbar lordosis. Mild degenerate facet hypertrophy is identified of the lumbar facets. No suspicious osseous abnormality. IMPRESSION: 1. Slight thickened appearance of the ascending colon with submucosal fat deposition weight represent stigmata of inflammatory bowel. A mild colitis is not entirely excluded. 2. No bowel obstruction is noted. 3. Stable left adrenal adenoma measuring 1.4 x 1.6 x 1.3 cm. 4. Mild hepatic steatosis. 5. Moderate aortoiliac and branch vessel atherosclerosis. Electronically Signed   By: Ashley Royalty M.D.   On: 08/31/2016 21:12   US Abdomen Limited  Result Date: 09/01/2016 CLINICAL DATA:  Abdominal pain in the right upper quadrant. EXAM: ULTRASOUND ABDOMEN LIMITED RIGHT UPPER QUADRANT COMPARISON:  08/31/2016 CT FINDINGS: Gallbladder: Biliary sludge and tiny calculi are seen near the neck of the gallbladder without wall thickening or pericholecystic fluid. The gallbladder wall is 1.7 mm in thickness. No sonographic  Murphy's. Common bile duct: Diameter: 7.1 mm and normal for age Liver: No focal lesion identified. Increased echogenicity of the liver consistent with fatty infiltration. IMPRESSION: 1. Uncomplicated cholelithiasis and biliary sludge noted of the gallbladder. 2. Fatty liver. Electronically Signed   By: Ashley Royalty M.D.   On: 09/01/2016 22:59   Dg Abd Acute W/chest  Result Date: 08/31/2016 CLINICAL DATA:  Abdominal pain EXAM: DG ABDOMEN ACUTE W/ 1V CHEST COMPARISON:  Chest x-ray   07/02/2015 FINDINGS: Right lung clear. Opacity left lung base is probably atelectatic although pneumonia cannot be completely excluded. The cardiopericardial silhouette is within normal limits for size. The visualized bony structures of the thorax are intact. Upright film shows no evidence for intraperitoneal free air. There is no evidence for gaseous bowel dilation to suggest  obstruction. Visualized bony anatomy is unremarkable. IMPRESSION: 1. Left base opacity is probably atelectasis but pneumonia cannot be completely excluded. 2. No evidence for bowel perforation or obstruction. Electronically Signed   By: Misty Stanley M.D.   On: 08/31/2016 20:13        Scheduled Meds: . amLODipine  10 mg Oral Daily  . aspirin EC  81 mg Oral Daily  . atorvastatin  40 mg Oral QHS  . enoxaparin (LOVENOX) injection  40 mg Subcutaneous Q24H  . insulin aspart  0-15 Units Subcutaneous TID WC  . insulin glargine  10 Units Subcutaneous Daily  . irbesartan  300 mg Oral Daily  . levothyroxine  150 mcg Oral QAC breakfast  . metoprolol tartrate  25 mg Oral BID  . multivitamin with minerals  1 tablet Oral Daily  . pantoprazole  40 mg Oral Daily   Continuous Infusions: . cefTRIAXone (ROCEPHIN)  IV Stopped (09/01/16 2030)  . metronidazole Stopped (09/02/16 0520)     LOS: 0 days     Cordelia Poche, MD [facility] 09/02/2016, 11:11 AM Pager: [phone]  If 7PM-7AM, please contact night-coverage [email or web address] Password [email or web address] 09/02/2016, 11:11 AM

## 2016-09-02 NOTE — Progress Notes (Signed)
[facility] Gastroenterology Progress Note  Christopher Patel 71 y.o. 08/23/1945  CC:  Abdominal pain, elevated LFTs   Subjective: Patient's abdominal pain is resolved now. Fever with temperature 100.4 last night. Denied nausea or vomiting. Denied blood in stool.  ROS : Negative for chest pain and weakness. Feeling better.   Objective: Vital signs in last 24 hours: Vitals:   09/02/16 0519 09/02/16 1050  BP: 130/69 (!) 145/86  Pulse: 69 (!) 57  Resp: 19 18  Temp: 98.6 F (37 C) 98.6 F (37 C)  SpO2: 94% 94%    Physical Exam:  General:  Alert, cooperative, no distress, appears stated age  Head:  Normocephalic, without obvious abnormality, atraumatic  Eyes:  , EOM's intact,   Lungs:   Clear to auscultation bilaterally, respirations unlabored  Heart:  Regular rate and rhythm, S1, S2 normal  Abdomen:   Soft, non-tender, bowel sounds active all four quadrants,  no masses,   Extremities: Extremities normal, atraumatic, no  edema  Pulses: 2+ and symmetric    Lab Results:  Recent Labs  09/01/16 0253 09/02/16 0007  NA 136 137  K 3.6 3.2*  CL 99* 99*  CO2 26 29  GLUCOSE 219* 116*  BUN 10 8  CREATININE 1.11 1.03  CALCIUM 7.9* 8.1*    Recent Labs  09/01/16 0253 09/02/16 0007  AST 594* 349*  ALT 564* 582*  ALKPHOS 67 90  BILITOT 2.8* 3.2*  PROT 5.5* 5.7*  ALBUMIN 3.2* 3.2*    Recent Labs  08/31/16 1816 09/02/16 0007  WBC 14.5* 12.1*  NEUTROABS  --  10.6*  HGB 14.3 12.4*  HCT 42.4 36.5*  MCV 88.1 89.0  PLT 214 139*   No results for input(s): LABPROT, INR in the last 72 hours.    Assessment/Plan: - Abnormal LFTs. Trending upwards. Need to rule out choledocholithiasis. - RUQ  Abdominal pain - resolved.  - Abnormal CT scan showing mild thickening of the ascending colon. Last colonoscopy around 3 years ago was normal and he is due for colonoscopy in 2020 according to patient.   Recommendations --------------------------- - Ultrasound only  showed gallstone  otherwise unremarkable. Hepatitis panel pending. - Patient's LFTs are trending up  And had  fever of 100.4 . I will get MRI/MRCP for further evaluation - Continue antibiotics for now. - Monitor LFTs. - GI will follow    Otis Brace MD, FACP 09/02/2016, 11:20 AM  Pager [phone]  If no answer or after 5 PM call [phone]

## 2016-09-02 NOTE — Progress Notes (Signed)
Pt was attempted on 1.5T and 3T scanners, refused both due to claustrophobia

## 2016-09-03 DIAGNOSIS — I10 Essential (primary) hypertension: Secondary | ICD-10-CM

## 2016-09-03 DIAGNOSIS — A4151 Sepsis due to Escherichia coli [E. coli]: Principal | ICD-10-CM

## 2016-09-03 LAB — COMPREHENSIVE METABOLIC PANEL
ALBUMIN: 3 g/dL — AB (ref 3.5–5.0)
ALT: 496 U/L — AB (ref 17–63)
ANION GAP: 9 (ref 5–15)
AST: 222 U/L — ABNORMAL HIGH (ref 15–41)
Alkaline Phosphatase: 153 U/L — ABNORMAL HIGH (ref 38–126)
BUN: 9 mg/dL (ref 6–20)
CHLORIDE: 100 mmol/L — AB (ref 101–111)
CO2: 26 mmol/L (ref 22–32)
Calcium: 8.3 mg/dL — ABNORMAL LOW (ref 8.9–10.3)
Creatinine, Ser: 1.02 mg/dL (ref 0.61–1.24)
GFR calc non Af Amer: >60 mL/min (ref >60)
GLUCOSE: 198 mg/dL — AB (ref 65–99)
Potassium: 3.6 mmol/L (ref 3.5–5.1)
SODIUM: 135 mmol/L (ref 135–145)
TOTAL PROTEIN: 6 g/dL — AB (ref 6.5–8.1)
Total Bilirubin: 2.2 mg/dL — ABNORMAL HIGH (ref 0.3–1.2)

## 2016-09-03 LAB — CULTURE, BLOOD (ROUTINE X 2): Special Requests: ADEQUATE

## 2016-09-03 LAB — HEPATITIS PANEL, ACUTE
HCV Ab: <0.1 s/co ratio (ref 0.0–0.9)
HEP B C IGM: NEGATIVE
HEP B S AG: NEGATIVE
Hep A IgM: NEGATIVE

## 2016-09-03 LAB — GLUCOSE, CAPILLARY
GLUCOSE-CAPILLARY: 176 mg/dL — AB (ref 65–99)
Glucose-Capillary: 220 mg/dL — ABNORMAL HIGH (ref 65–99)

## 2016-09-03 MED ORDER — AMOXICILLIN 500 MG PO CAPS: 500.0000 mg | ORAL_CAPSULE | Freq: Three times a day (TID) | ORAL | 0 refills | Status: AC

## 2016-09-03 MED ORDER — AMOXICILLIN 500 MG PO CAPS: 500.0000 mg | ORAL_CAPSULE | Freq: Three times a day (TID) | ORAL | Status: DC

## 2016-09-03 MED ORDER — METRONIDAZOLE 500 MG PO TABS: 250.0000 mg | ORAL_TABLET | Freq: Three times a day (TID) | ORAL | Status: DC

## 2016-09-03 MED ORDER — METRONIDAZOLE 250 MG PO TABS: 250.0000 mg | ORAL_TABLET | Freq: Three times a day (TID) | ORAL | 0 refills | Status: AC

## 2016-09-03 NOTE — Discharge Summary (Signed)
Physician Discharge Summary  Yohann Curl JJH:[id number] DOB: 05/03/1945 DOA: 08/31/2016  PCP: Lujean Amel, MD  Admit date: 08/31/2016 Discharge date: 09/03/2016  Admitted From: Home Disposition: Home  Recommendations for Outpatient Follow-up:  1. Follow up with PCP in 1 week 2. Follow up with GI in 4 weeks 3. Please obtain CMP in one week 4. Please follow up on the following pending results: Final blood culture report (1/2)  Home Health: None Equipment/Devices: None  Discharge Condition: Stable CODE STATUS: Full code Diet recommendation: Heart healthy   Brief/Interim Summary:  Admission HPI written by Etta Quill, DO   Chief Complaint: ABD pain  HPI: Christopher Patel is a 71 y.o. male with medical history significant of DM2, HTN.  Patient presents to the ED with c/o diffuse abdominal pain.  Symptoms onset this AM about 10am, this was 1 hour after eating breakfast which included Eggs, sausage, toast, and pizza.  No diarrhea, constipation, nor vomiting, does have nausea.  Went to UC who sent him to ED.  No prior abd surgeries.  No known gallbladder issues though his dad had gallbladder removed.   ED Course: LFTs in the 300s, bili 2, lactate 4.1 improves to 2.x after IVF.  Tm 100.0, WBC 14.5k.  Patient started on zosyn.   Hospital course:  RUQ pain Resolved. Possibly secondary to biliary colic vs passed gallstone. GI recommending outpatient follow-up.  Elevated AST/ALT Elevated bilirubin Over the course of the admission, AST, ALT, bilirubin trending down. According phosphatase increasing prior to discharge. Right upper quadrant ultrasound unremarkable. MRCP significant for Fatty liver infiltration with cholelithiasis and no evidence of cholecystitis; normal caliber and course of common bile duct and main pancreatic duct; stable small left paraspinal mass at the T8-9 level as no change since 2011; stable small left adrenal gland adenoma. Gastroenterology recommends  follow-up outpatient with them in 4-6 weeks. He will need repeat laboratory sure continued improvement in liver enzymes. Recommend holding statin until liver enzymes have normalized.  MRCP report was not available on discharge be epic and hard copy was obtained. Hard copy kept in chart for submission to medical records for scanning. I have provided pictures of the report in this discharge.  Hypokalemia Patient given potassium supplementation.  Sepsis Secondary to Escherichia coli. Initially managed with ceftriaxone and transitioned to amoxicillin at discharge.  Enteritis/colitis Seen on CT scan. Patient managed with ceftriaxone and Flagyl. He will be discharged on amoxicillin and Flagyl.  Hypothyroidism Continued Synthroid  Cough Possible pneumonia seen on CT. Treatment with amoxicillin on discharge. Cough has improved.  Discharge Diagnoses:  Principal Problem:   RUQ abdominal pain Active Problems:   Essential hypertension, benign   Diabetes mellitus (HCC)   Transaminitis   SIRS (systemic inflammatory response syndrome) (HCC)   Lactic acidosis    Discharge Instructions  Discharge Instructions    Call MD for:  difficulty breathing, headache or visual disturbances    Complete by:  As directed    Call MD for:  extreme fatigue    Complete by:  As directed    Call MD for:  persistant dizziness or light-headedness    Complete by:  As directed    Call MD for:  persistant nausea and vomiting    Complete by:  As directed    Call MD for:  severe uncontrolled pain    Complete by:  As directed    Call MD for:  temperature >100.4    Complete by:  As directed    Diet -  low sodium heart healthy    Complete by:  As directed    Increase activity slowly    Complete by:  As directed      Allergies as of 09/03/2016      Reactions   Actos [pioglitazone Hydrochloride] Nausea Only   Glyburide Other (See Comments)   hypoglycemia   Metformin And Related Other (See Comments)    Hypoglycemia, shakes Fast acting metformin   Codeine Itching      Medication List    STOP taking these medications   atorvastatin 80 MG tablet Commonly known as:  LIPITOR     TAKE these medications   amLODipine 10 MG tablet Commonly known as:  NORVASC Take 10 mg by mouth daily.   amoxicillin 500 MG capsule Commonly known as:  AMOXIL Take 1 capsule (500 mg total) by mouth 3 (three) times daily.   aspirin EC 81 MG tablet Take 81 mg by mouth daily.   fexofenadine 180 MG tablet Commonly known as:  ALLEGRA Take 180 mg by mouth daily as needed. Allergies   fluticasone 27.5 MCG/SPRAY nasal spray Commonly known as:  VERAMYST Place 2 sprays into the nose daily as needed for allergies.   hydrochlorothiazide 25 MG tablet Commonly known as:  HYDRODIURIL Take 25 mg by mouth daily.   irbesartan 300 MG tablet Commonly known as:  AVAPRO Take 300 mg by mouth daily.   levothyroxine 150 MCG tablet Commonly known as:  SYNTHROID, LEVOTHROID Take 150 mcg by mouth daily before breakfast.   metFORMIN 500 MG 24 hr tablet Commonly known as:  GLUCOPHAGE-XR Take 1,000 mg by mouth 2 (two) times daily.   metoprolol tartrate 25 MG tablet Commonly known as:  LOPRESSOR Take 1 tablet (25 mg total) by mouth 2 (two) times daily.   metroNIDAZOLE 250 MG tablet Commonly known as:  FLAGYL Take 1 tablet (250 mg total) by mouth 3 (three) times daily.   multivitamin tablet Take 1 tablet by mouth daily.   pantoprazole 40 MG tablet Commonly known as:  PROTONIX Take 40 mg by mouth daily.   TYLENOL 500 MG tablet Generic drug:  acetaminophen Take 500 mg by mouth every 6 (six) hours as needed for moderate pain (BACK PAIN).      Follow-up Information    Brahmbhatt, Parag, MD. Schedule an appointment as soon as possible for a visit in 4 week(s).   Specialty:  Gastroenterology Contact information: [place] [address] [phone]        Lujean Amel, MD.  Schedule an appointment as soon as possible for a visit in 1 week(s).   Specialty:  Family Medicine Why:  Recheck labs Contact information: [address] [place] [address] [phone]          Allergies  Allergen Reactions  . Actos [Pioglitazone Hydrochloride] Nausea Only  . Glyburide Other (See Comments)    hypoglycemia  . Metformin And Related Other (See Comments)    Hypoglycemia, shakes Fast acting metformin  . Codeine Itching    Consultations:  Gastroenterology   Procedures/Studies: Ct Abdomen Pelvis W Contrast  Result Date: 08/31/2016 CLINICAL DATA:  Right lower quadrant pain and nausea beginning this morning. EXAM: CT ABDOMEN AND PELVIS WITH CONTRAST TECHNIQUE: Multidetector CT imaging of the abdomen and pelvis was performed using the standard protocol following bolus administration of intravenous contrast. CONTRAST:  193mL ISOVUE-300 IOPAMIDOL (ISOVUE-300) INJECTION 61% COMPARISON:  04/27/2013 CT, MRI 06/17/2014 FINDINGS: Lower chest: The included heart is normal  in size without pericardial effusion. There is bibasilar dependent atelectasis. Hepatobiliary: Hepatic steatosis with uncomplicated cholelithiasis. No biliary dilatation. Hemangioma seen on MRI is not apparent on current exam. This could be due timing of contrast. Pancreas: Unremarkable. No pancreatic ductal dilatation or surrounding inflammatory changes. Spleen: Normal in size without focal abnormality. Adrenals/Urinary Tract: Hypodense left anterior limb adrenal nodule measuring 1.4 x 1.6 x 1.3 cm is stable and consistent with an adenoma based on low Hounsfield unit of 25 post contrast CT. Right adrenal gland is unremarkable. The kidneys demonstrate symmetric enhancement without obstructive uropathy. The urinary bladder is physiologic. Stomach/Bowel: Slight thickening of the ascending colon with submucosal fatty deposition. Findings may reflect stigmata of inflammatory bowel disease or mild  colitis. Normal-appearing appendix. The stomach and small intestine are nonacute. Next Vascular/Lymphatic: Moderate-to-marked atherosclerotic calcifications of the abdominal aorta extending into the common iliac arteries and their branch vessels. Mild branch vessel atherosclerosis off the aorta is noted involving the SMA. No adenopathy. Reproductive: Normal size prostate and seminal vesicles. Calcifications of the vas deferens may be seen with diabetes. Other: No abdominal wall hernia or abnormality. No abdominopelvic ascites. Musculoskeletal: Degenerative changes are seen along the dorsal spine with small anterior osteophytes of the included lower thoracic spine and to a lesser degree the lumbar spine. Slight straightening of lumbar lordosis. Mild degenerate facet hypertrophy is identified of the lumbar facets. No suspicious osseous abnormality. IMPRESSION: 1. Slight thickened appearance of the ascending colon with submucosal fat deposition weight represent stigmata of inflammatory bowel. A mild colitis is not entirely excluded. 2. No bowel obstruction is noted. 3. Stable left adrenal adenoma measuring 1.4 x 1.6 x 1.3 cm. 4. Mild hepatic steatosis. 5. Moderate aortoiliac and branch vessel atherosclerosis. Electronically Signed   By: Ashley Royalty M.D.   On: 08/31/2016 21:12   US Abdomen Limited  Result Date: 09/01/2016 CLINICAL DATA:  Abdominal pain in the right upper quadrant. EXAM: ULTRASOUND ABDOMEN LIMITED RIGHT UPPER QUADRANT COMPARISON:  08/31/2016 CT FINDINGS: Gallbladder: Biliary sludge and tiny calculi are seen near the neck of the gallbladder without wall thickening or pericholecystic fluid. The gallbladder wall is 1.7 mm in thickness. No sonographic Murphy's. Common bile duct: Diameter: 7.1 mm and normal for age Liver: No focal lesion identified. Increased echogenicity of the liver consistent with fatty infiltration. IMPRESSION: 1. Uncomplicated cholelithiasis and biliary sludge noted of the  gallbladder. 2. Fatty liver. Electronically Signed   By: Ashley Royalty M.D.   On: 09/01/2016 22:59   Dg Abd Acute W/chest  Result Date: 08/31/2016 CLINICAL DATA:  Abdominal pain EXAM: DG ABDOMEN ACUTE W/ 1V CHEST COMPARISON:  Chest x-ray   07/02/2015 FINDINGS: Right lung clear. Opacity left lung base is probably atelectatic although pneumonia cannot be completely excluded. The cardiopericardial silhouette is within normal limits for size. The visualized bony structures of the thorax are intact. Upright film shows no evidence for intraperitoneal free air. There is no evidence for gaseous bowel dilation to suggest obstruction. Visualized bony anatomy is unremarkable. IMPRESSION: 1. Left base opacity is probably atelectasis but pneumonia cannot be completely excluded. 2. No evidence for bowel perforation or obstruction. Electronically Signed   By: Misty Stanley M.D.   On: 08/31/2016 20:13        Impression by Marijo Sanes, MD on 09/03/2016 at 2209  1. Fatty infiltration of the liver but no focal hepatic lesions or intrahepatic biliary dilatation. The portal and hepatic veins are normal. 2. Cholelithiasis but no MR findings to suggest acute cholecystitis. Normal  caliber and course of the common bile duct and main pancreatic duct. 3. Small duodenum diverticulum near the pancreatic head. 4. 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 No change since 2011. 5. Stable small left adrenal gland adenoma. 6. No mesenteric or retroperitoneal mass or adenopathy. Stable periportal lymph nodes.   Subjective: No abdominal pain.  Discharge Exam: Vitals:   09/03/16 0429 09/03/16 1024  BP: (!) 157/83 133/69  Pulse: 87 64  Resp: 18 18  Temp: 97.7 F (36.5 C) 97.7 F (36.5 C)  SpO2: 93% 94%   Vitals:   09/02/16 2103 09/02/16 2351 09/03/16 0429 09/03/16 1024  BP: (!) 149/81 138/64 (!) 157/83 133/69  Pulse: 82 81 87 64  Resp: 18 18 18 18   Temp: 98.6 F (37 C) 98.7 F (37.1 C) 97.7 F (36.5 C)  97.7 F (36.5 C)  TempSrc: Oral Oral Oral Oral  SpO2: 91% 91% 93% 94%  Weight:      Height:        General: Pt is alert, awake, not in acute distress Cardiovascular: RRR, S1/S2 +, no rubs, no gallops Respiratory: CTA bilaterally, no wheezing, no rhonchi Abdominal: Soft, NT, ND, bowel sounds + Extremities: no edema, no cyanosis    The results of significant diagnostics from this hospitalization (including imaging, microbiology, ancillary and laboratory) are listed below for reference.     Microbiology: Recent Results (from the past 240 hour(s))  Blood culture (routine x 2)     Status: Abnormal   Collection Time: 08/31/16  7:14 PM  Result Value Ref Range Status   Specimen Description BLOOD LEFT ANTECUBITAL  Final   Special Requests   Final    BOTTLES DRAWN AEROBIC AND ANAEROBIC Blood Culture adequate volume   Culture  Setup Time   Final    GRAM NEGATIVE RODS AEROBIC BOTTLE ONLY Organism ID to follow CRITICAL RESULT CALLED TO, READ BACK BY AND VERIFIED WITH: Alvino Chapel.D. 13:10 09/01/16 ([email or web address])    Culture ESCHERICHIA COLI (A)  Final   Report Status 09/03/2016 FINAL  Final   Organism ID, Bacteria ESCHERICHIA COLI  Final      Susceptibility   Escherichia coli - MIC*    AMPICILLIN 4 SENSITIVE Sensitive     CEFAZOLIN <=4 SENSITIVE Sensitive     CEFEPIME <=1 SENSITIVE Sensitive     CEFTAZIDIME <=1 SENSITIVE Sensitive     CEFTRIAXONE <=1 SENSITIVE Sensitive     CIPROFLOXACIN <=0.25 SENSITIVE Sensitive     GENTAMICIN <=1 SENSITIVE Sensitive     IMIPENEM <=0.25 SENSITIVE Sensitive     TRIMETH/SULFA <=20 SENSITIVE Sensitive     AMPICILLIN/SULBACTAM <=2 SENSITIVE Sensitive     PIP/TAZO <=4 SENSITIVE Sensitive     Extended ESBL NEGATIVE Sensitive     * ESCHERICHIA COLI  Blood Culture ID Panel (Reflexed)     Status: Abnormal   Collection Time: 08/31/16  7:14 PM  Result Value Ref Range Status   Enterococcus species NOT DETECTED NOT DETECTED Final   Vancomycin resistance  NOT DETECTED NOT DETECTED Final   Listeria monocytogenes NOT DETECTED NOT DETECTED Final   Staphylococcus species NOT DETECTED NOT DETECTED Final   Staphylococcus aureus NOT DETECTED NOT DETECTED Final   Methicillin resistance NOT DETECTED NOT DETECTED Final   Streptococcus species NOT DETECTED NOT DETECTED Final   Streptococcus agalactiae NOT DETECTED NOT DETECTED Final   Streptococcus pneumoniae NOT DETECTED NOT DETECTED Final   Streptococcus pyogenes NOT DETECTED NOT DETECTED Final   Acinetobacter baumannii NOT DETECTED  NOT DETECTED Final   Enterobacteriaceae species DETECTED (A) NOT DETECTED Final    Comment: Enterobacteriaceae represent a large family of gram-negative bacteria, not a single organism. CRITICAL RESULT CALLED TO, READ BACK BY AND VERIFIED WITH: Alvino Chapel.D. 13:10 09/01/16 ([email or web address])    Enterobacter cloacae complex NOT DETECTED NOT DETECTED Final   Escherichia coli DETECTED (A) NOT DETECTED Final    Comment: CRITICAL RESULT CALLED TO, READ BACK BY AND VERIFIED WITH: EDaisy Lazar.D. 13:10 09/01/08 ([email or web address])    Klebsiella oxytoca NOT DETECTED NOT DETECTED Final   Klebsiella pneumoniae NOT DETECTED NOT DETECTED Final   Proteus species NOT DETECTED NOT DETECTED Final   Serratia marcescens NOT DETECTED NOT DETECTED Final   Carbapenem resistance NOT DETECTED NOT DETECTED Final   Haemophilus influenzae NOT DETECTED NOT DETECTED Final   Neisseria meningitidis NOT DETECTED NOT DETECTED Final   Pseudomonas aeruginosa NOT DETECTED NOT DETECTED Final   Candida albicans NOT DETECTED NOT DETECTED Final   Candida glabrata NOT DETECTED NOT DETECTED Final   Candida krusei NOT DETECTED NOT DETECTED Final   Candida parapsilosis NOT DETECTED NOT DETECTED Final   Candida tropicalis NOT DETECTED NOT DETECTED Final  Blood culture (routine x 2)     Status: None (Preliminary result)   Collection Time: 08/31/16  7:39 PM  Result Value Ref Range Status   Specimen Description BLOOD  RIGHT HAND  Final   Special Requests   Final    BOTTLES DRAWN AEROBIC AND ANAEROBIC Blood Culture adequate volume   Culture NO GROWTH 3 DAYS  Final   Report Status PENDING  Incomplete     Labs: BNP (last 3 results) No results for input(s): BNP in the last 8760 hours. Basic Metabolic Panel:  Recent Labs Lab 08/31/16 1816 09/01/16 0253 09/02/16 0007 09/03/16 0220  NA 136 136 137 135  K 3.8 3.6 3.2* 3.6  CL 97* 99* 99* 100*  CO2 28 26 29 26   GLUCOSE 221* 219* 116* 198*  BUN 11 10 8 9   CREATININE 1.07 1.11 1.03 1.02  CALCIUM 9.0 7.9* 8.1* 8.3*   Liver Function Tests:  Recent Labs Lab 08/31/16 1816 09/01/16 0253 09/02/16 0007 09/03/16 0220  AST 360* 594* 349* 222*  ALT 262* 564* 582* 496*  ALKPHOS 82 67 90 153*  BILITOT 2.0* 2.8* 3.2* 2.2*  PROT 6.8 5.5* 5.7* 6.0*  ALBUMIN 4.1 3.2* 3.2* 3.0*    Recent Labs Lab 08/31/16 1816  LIPASE 91*   No results for input(s): AMMONIA in the last 168 hours. CBC:  Recent Labs Lab 08/31/16 1816 09/02/16 0007  WBC 14.5* 12.1*  NEUTROABS  --  10.6*  HGB 14.3 12.4*  HCT 42.4 36.5*  MCV 88.1 89.0  PLT 214 139*   Cardiac Enzymes: No results for input(s): CKTOTAL, CKMB, CKMBINDEX, TROPONINI in the last 168 hours. BNP: Invalid input(s): POCBNP CBG:  Recent Labs Lab 09/02/16 1131 09/02/16 1617 09/02/16 2108 09/03/16 0650 09/03/16 1153  GLUCAP 245* 212* 245* 176* 220*   D-Dimer No results for input(s): DDIMER in the last 72 hours. Hgb A1c No results for input(s): HGBA1C in the last 72 hours. Lipid Profile No results for input(s): CHOL, HDL, LDLCALC, TRIG, CHOLHDL, LDLDIRECT in the last 72 hours. Thyroid function studies No results for input(s): TSH, T4TOTAL, T3FREE, THYROIDAB in the last 72 hours.  Invalid input(s): FREET3 Anemia work up No results for input(s): VITAMINB12, FOLATE, FERRITIN, TIBC, IRON, RETICCTPCT in the last 72 hours. Urinalysis    Component Value Date/Time  COLORURINE YELLOW 08/31/2016  2024   APPEARANCEUR CLEAR 08/31/2016 2024   LABSPEC 1.015 08/31/2016 2024   PHURINE 5.0 08/31/2016 2024   GLUCOSEU 50 (A) 08/31/2016 2024   HGBUR NEGATIVE 08/31/2016 2024   BILIRUBINUR NEGATIVE 08/31/2016 2024   KETONESUR NEGATIVE 08/31/2016 2024   PROTEINUR NEGATIVE 08/31/2016 2024   UROBILINOGEN 0.2 04/26/2013 2235   NITRITE NEGATIVE 08/31/2016 2024   LEUKOCYTESUR NEGATIVE 08/31/2016 2024   Sepsis Labs Invalid input(s): PROCALCITONIN,  WBC,  LACTICIDVEN Microbiology Recent Results (from the past 240 hour(s))  Blood culture (routine x 2)     Status: Abnormal   Collection Time: 08/31/16  7:14 PM  Result Value Ref Range Status   Specimen Description BLOOD LEFT ANTECUBITAL  Final   Special Requests   Final    BOTTLES DRAWN AEROBIC AND ANAEROBIC Blood Culture adequate volume   Culture  Setup Time   Final    GRAM NEGATIVE RODS AEROBIC BOTTLE ONLY Organism ID to follow CRITICAL RESULT CALLED TO, READ BACK BY AND VERIFIED WITH: Alvino Chapel.D. 13:10 09/01/16 ([email or web address])    Culture ESCHERICHIA COLI (A)  Final   Report Status 09/03/2016 FINAL  Final   Organism ID, Bacteria ESCHERICHIA COLI  Final      Susceptibility   Escherichia coli - MIC*    AMPICILLIN 4 SENSITIVE Sensitive     CEFAZOLIN <=4 SENSITIVE Sensitive     CEFEPIME <=1 SENSITIVE Sensitive     CEFTAZIDIME <=1 SENSITIVE Sensitive     CEFTRIAXONE <=1 SENSITIVE Sensitive     CIPROFLOXACIN <=0.25 SENSITIVE Sensitive     GENTAMICIN <=1 SENSITIVE Sensitive     IMIPENEM <=0.25 SENSITIVE Sensitive     TRIMETH/SULFA <=20 SENSITIVE Sensitive     AMPICILLIN/SULBACTAM <=2 SENSITIVE Sensitive     PIP/TAZO <=4 SENSITIVE Sensitive     Extended ESBL NEGATIVE Sensitive     * ESCHERICHIA COLI  Blood Culture ID Panel (Reflexed)     Status: Abnormal   Collection Time: 08/31/16  7:14 PM  Result Value Ref Range Status   Enterococcus species NOT DETECTED NOT DETECTED Final   Vancomycin resistance NOT DETECTED NOT DETECTED Final    Listeria monocytogenes NOT DETECTED NOT DETECTED Final   Staphylococcus species NOT DETECTED NOT DETECTED Final   Staphylococcus aureus NOT DETECTED NOT DETECTED Final   Methicillin resistance NOT DETECTED NOT DETECTED Final   Streptococcus species NOT DETECTED NOT DETECTED Final   Streptococcus agalactiae NOT DETECTED NOT DETECTED Final   Streptococcus pneumoniae NOT DETECTED NOT DETECTED Final   Streptococcus pyogenes NOT DETECTED NOT DETECTED Final   Acinetobacter baumannii NOT DETECTED NOT DETECTED Final   Enterobacteriaceae species DETECTED (A) NOT DETECTED Final    Comment: Enterobacteriaceae represent a large family of gram-negative bacteria, not a single organism. CRITICAL RESULT CALLED TO, READ BACK BY AND VERIFIED WITH: Alvino Chapel.D. 13:10 09/01/16 ([email or web address])    Enterobacter cloacae complex NOT DETECTED NOT DETECTED Final   Escherichia coli DETECTED (A) NOT DETECTED Final    Comment: CRITICAL RESULT CALLED TO, READ BACK BY AND VERIFIED WITH: EDaisy Lazar.D. 13:10 09/01/08 ([email or web address])    Klebsiella oxytoca NOT DETECTED NOT DETECTED Final   Klebsiella pneumoniae NOT DETECTED NOT DETECTED Final   Proteus species NOT DETECTED NOT DETECTED Final   Serratia marcescens NOT DETECTED NOT DETECTED Final   Carbapenem resistance NOT DETECTED NOT DETECTED Final   Haemophilus influenzae NOT DETECTED NOT DETECTED Final   Neisseria meningitidis NOT DETECTED NOT DETECTED Final   Pseudomonas aeruginosa NOT DETECTED  NOT DETECTED Final   Candida albicans NOT DETECTED NOT DETECTED Final   Candida glabrata NOT DETECTED NOT DETECTED Final   Candida krusei NOT DETECTED NOT DETECTED Final   Candida parapsilosis NOT DETECTED NOT DETECTED Final   Candida tropicalis NOT DETECTED NOT DETECTED Final  Blood culture (routine x 2)     Status: None (Preliminary result)   Collection Time: 08/31/16  7:39 PM  Result Value Ref Range Status   Specimen Description BLOOD RIGHT HAND  Final   Special Requests    Final    BOTTLES DRAWN AEROBIC AND ANAEROBIC Blood Culture adequate volume   Culture NO GROWTH 3 DAYS  Final   Report Status PENDING  Incomplete     Time coordinating discharge: Over 30 minutes  SIGNED:   Cordelia Poche, MD [facility] 09/03/2016, 1:14 PM Pager [phone]  If 7PM-7AM, please contact night-coverage [email or web address] Password [email or web address]

## 2016-09-03 NOTE — Progress Notes (Signed)
[facility] Gastroenterology Progress Note  Christopher Patel 71 y.o. 1945/12/07  CC:  Abdominal pain, elevated LFTs   Subjective:  No further episodes of fever. Remains asymptomatic. Denied abdominal pain, nausea or vomiting. Denied blood in the stool.  ROS : Negative for chest pain and weakness. Feeling better.   Objective: Vital signs in last 24 hours: Vitals:   09/02/16 2351 09/03/16 0429  BP: 138/64 (!) 157/83  Pulse: 81 87  Resp: 18 18  Temp: 98.7 F (37.1 C) 97.7 F (36.5 C)  SpO2: 91% 93%    Physical Exam:  General:  Alert, cooperative, no distress, appears stated age  Head:  Normocephalic, without obvious abnormality, atraumatic  Eyes:  , EOM's intact,   Lungs:   Clear to auscultation bilaterally, respirations unlabored  Heart:  Regular rate and rhythm, S1, S2 normal  Abdomen:   Soft, non-tender, bowel sounds active all four quadrants,  no masses,   Extremities: Extremities normal, atraumatic, no  edema  Pulses: 2+ and symmetric    Lab Results:  Recent Labs  09/02/16 0007 09/03/16 0220  NA 137 135  K 3.2* 3.6  CL 99* 100*  CO2 29 26  GLUCOSE 116* 198*  BUN 8 9  CREATININE 1.03 1.02  CALCIUM 8.1* 8.3*    Recent Labs  09/02/16 0007 09/03/16 0220  AST 349* 222*  ALT 582* 496*  ALKPHOS 90 153*  BILITOT 3.2* 2.2*  PROT 5.7* 6.0*  ALBUMIN 3.2* 3.0*    Recent Labs  08/31/16 1816 09/02/16 0007  WBC 14.5* 12.1*  NEUTROABS  --  10.6*  HGB 14.3 12.4*  HCT 42.4 36.5*  MCV 88.1 89.0  PLT 214 139*   No results for input(s): LABPROT, INR in the last 72 hours.    Assessment/Plan: - Abnormal LFTs.. Need to rule out choledocholithiasis. - RUQ  Abdominal pain - resolved.  - Abnormal CT scan showing mild thickening of the ascending colon. Last colonoscopy around 3 years ago was normal and he is due for colonoscopy in 2020 according to patient.   Recommendations --------------------------- - Ultrasound only  showed gallstone otherwise unremarkable.  Hepatitis panel Negative - Follow MRI-MRCP report. LFTs trending down except for alkaline phosphatase which is trending upward. - Okay to discharge home from GI standpoint if negative MRI. - If MRI shows choledocholithiasis, he will need ERCP - Follow-up in GI clinic in 4-6 weeks after discharge - Continue antibiotics for now. - GI will follow    Otis Brace MD, [place] 09/03/2016, 10:12 AM  Pager [phone]  If no answer or after 5 PM call [phone]

## 2016-09-03 NOTE — Discharge Instructions (Signed)
Christopher Patel,  Your admitted for concern of a gallbladder issue. You were found to have gallbladder stones but no evidence of acute cholecystitis. Lab work as started to improve prior to discharge but he will need to follow-up with her primary care to have repeat lab work performed. While you're here, your also found to have an Escherichia coli blood infection. You have been treated with antibiotics while in the hospital and will need to continue on discharge. Please follow-up with the gastroenterologist in 4 weeks with regard to your liver and gallbladder.

## 2016-09-05 DIAGNOSIS — E7800 Pure hypercholesterolemia, unspecified: Secondary | ICD-10-CM | POA: Diagnosis not present

## 2016-09-05 DIAGNOSIS — K8020 Calculus of gallbladder without cholecystitis without obstruction: Secondary | ICD-10-CM | POA: Diagnosis not present

## 2016-09-05 DIAGNOSIS — R740 Nonspecific elevation of levels of transaminase and lactic acid dehydrogenase [LDH]: Secondary | ICD-10-CM | POA: Diagnosis not present

## 2016-09-05 LAB — CULTURE, BLOOD (ROUTINE X 2)
CULTURE: NO GROWTH
Special Requests: ADEQUATE

## 2016-09-08 DIAGNOSIS — E1165 Type 2 diabetes mellitus with hyperglycemia: Secondary | ICD-10-CM | POA: Diagnosis not present

## 2016-09-08 DIAGNOSIS — E785 Hyperlipidemia, unspecified: Secondary | ICD-10-CM | POA: Diagnosis not present

## 2016-09-08 DIAGNOSIS — E039 Hypothyroidism, unspecified: Secondary | ICD-10-CM | POA: Diagnosis not present

## 2016-09-08 DIAGNOSIS — I2510 Atherosclerotic heart disease of native coronary artery without angina pectoris: Secondary | ICD-10-CM | POA: Diagnosis not present

## 2016-09-08 DIAGNOSIS — I480 Paroxysmal atrial fibrillation: Secondary | ICD-10-CM | POA: Diagnosis not present

## 2016-09-08 DIAGNOSIS — N401 Enlarged prostate with lower urinary tract symptoms: Secondary | ICD-10-CM | POA: Diagnosis not present

## 2016-09-08 DIAGNOSIS — I10 Essential (primary) hypertension: Secondary | ICD-10-CM | POA: Diagnosis not present

## 2016-09-22 DIAGNOSIS — K8020 Calculus of gallbladder without cholecystitis without obstruction: Secondary | ICD-10-CM | POA: Diagnosis not present

## 2016-10-04 DIAGNOSIS — I480 Paroxysmal atrial fibrillation: Secondary | ICD-10-CM | POA: Diagnosis not present

## 2016-10-04 DIAGNOSIS — Z794 Long term (current) use of insulin: Secondary | ICD-10-CM | POA: Diagnosis not present

## 2016-10-04 DIAGNOSIS — E1165 Type 2 diabetes mellitus with hyperglycemia: Secondary | ICD-10-CM | POA: Diagnosis not present

## 2016-10-04 DIAGNOSIS — I2510 Atherosclerotic heart disease of native coronary artery without angina pectoris: Secondary | ICD-10-CM | POA: Diagnosis not present

## 2016-10-04 DIAGNOSIS — I10 Essential (primary) hypertension: Secondary | ICD-10-CM | POA: Diagnosis not present

## 2016-10-04 DIAGNOSIS — K8050 Calculus of bile duct without cholangitis or cholecystitis without obstruction: Secondary | ICD-10-CM | POA: Diagnosis not present

## 2016-10-20 DIAGNOSIS — I480 Paroxysmal atrial fibrillation: Secondary | ICD-10-CM | POA: Diagnosis not present

## 2016-10-20 DIAGNOSIS — I10 Essential (primary) hypertension: Secondary | ICD-10-CM | POA: Diagnosis not present

## 2016-10-20 DIAGNOSIS — E039 Hypothyroidism, unspecified: Secondary | ICD-10-CM | POA: Diagnosis not present

## 2016-10-20 DIAGNOSIS — N401 Enlarged prostate with lower urinary tract symptoms: Secondary | ICD-10-CM | POA: Diagnosis not present

## 2016-10-20 DIAGNOSIS — I2510 Atherosclerotic heart disease of native coronary artery without angina pectoris: Secondary | ICD-10-CM | POA: Diagnosis not present

## 2016-10-20 DIAGNOSIS — E1165 Type 2 diabetes mellitus with hyperglycemia: Secondary | ICD-10-CM | POA: Diagnosis not present

## 2016-10-20 DIAGNOSIS — E785 Hyperlipidemia, unspecified: Secondary | ICD-10-CM | POA: Diagnosis not present

## 2016-11-09 DIAGNOSIS — Z23 Encounter for immunization: Secondary | ICD-10-CM | POA: Diagnosis not present

## 2016-11-10 ENCOUNTER — Other Ambulatory Visit: Payer: Self-pay

## 2016-11-10 ENCOUNTER — Telehealth: Payer: Self-pay | Admitting: Endocrinology

## 2016-11-10 NOTE — Telephone Encounter (Signed)
Called and spoke with patient. He didn't realize that insulin was only sold at [place] he could buy it OTC.

## 2016-11-10 NOTE — Telephone Encounter (Signed)
Patient had prescription changed to [place] on Novalin Injection. [employer] never received it. Please call patient asap

## 2016-12-20 DIAGNOSIS — N401 Enlarged prostate with lower urinary tract symptoms: Secondary | ICD-10-CM | POA: Diagnosis not present

## 2016-12-20 DIAGNOSIS — I10 Essential (primary) hypertension: Secondary | ICD-10-CM | POA: Diagnosis not present

## 2016-12-25 ENCOUNTER — Other Ambulatory Visit: Payer: Self-pay | Admitting: Endocrinology

## 2017-01-01 DIAGNOSIS — E039 Hypothyroidism, unspecified: Secondary | ICD-10-CM | POA: Diagnosis not present

## 2017-01-01 DIAGNOSIS — I480 Paroxysmal atrial fibrillation: Secondary | ICD-10-CM | POA: Diagnosis not present

## 2017-01-01 DIAGNOSIS — I10 Essential (primary) hypertension: Secondary | ICD-10-CM | POA: Diagnosis not present

## 2017-01-01 DIAGNOSIS — E1165 Type 2 diabetes mellitus with hyperglycemia: Secondary | ICD-10-CM | POA: Diagnosis not present

## 2017-01-01 DIAGNOSIS — Z794 Long term (current) use of insulin: Secondary | ICD-10-CM | POA: Diagnosis not present

## 2017-01-01 DIAGNOSIS — N401 Enlarged prostate with lower urinary tract symptoms: Secondary | ICD-10-CM | POA: Diagnosis not present

## 2017-01-01 DIAGNOSIS — E785 Hyperlipidemia, unspecified: Secondary | ICD-10-CM | POA: Diagnosis not present

## 2017-01-01 DIAGNOSIS — I2510 Atherosclerotic heart disease of native coronary artery without angina pectoris: Secondary | ICD-10-CM | POA: Diagnosis not present

## 2017-01-17 DIAGNOSIS — E1165 Type 2 diabetes mellitus with hyperglycemia: Secondary | ICD-10-CM | POA: Diagnosis not present

## 2017-01-17 DIAGNOSIS — Z125 Encounter for screening for malignant neoplasm of prostate: Secondary | ICD-10-CM | POA: Diagnosis not present

## 2017-01-17 DIAGNOSIS — E039 Hypothyroidism, unspecified: Secondary | ICD-10-CM | POA: Diagnosis not present

## 2017-01-17 DIAGNOSIS — I10 Essential (primary) hypertension: Secondary | ICD-10-CM | POA: Diagnosis not present

## 2017-01-17 DIAGNOSIS — Z0001 Encounter for general adult medical examination with abnormal findings: Secondary | ICD-10-CM | POA: Diagnosis not present

## 2017-01-17 DIAGNOSIS — I2510 Atherosclerotic heart disease of native coronary artery without angina pectoris: Secondary | ICD-10-CM | POA: Diagnosis not present

## 2017-01-17 DIAGNOSIS — Z79899 Other long term (current) drug therapy: Secondary | ICD-10-CM | POA: Diagnosis not present

## 2017-01-17 DIAGNOSIS — E7800 Pure hypercholesterolemia, unspecified: Secondary | ICD-10-CM | POA: Diagnosis not present

## 2017-02-15 DIAGNOSIS — R3912 Poor urinary stream: Secondary | ICD-10-CM | POA: Diagnosis not present

## 2017-02-15 DIAGNOSIS — R351 Nocturia: Secondary | ICD-10-CM | POA: Diagnosis not present

## 2017-02-15 DIAGNOSIS — N401 Enlarged prostate with lower urinary tract symptoms: Secondary | ICD-10-CM | POA: Diagnosis not present

## 2017-03-05 DIAGNOSIS — Z79899 Other long term (current) drug therapy: Secondary | ICD-10-CM | POA: Diagnosis not present

## 2017-03-12 DIAGNOSIS — I480 Paroxysmal atrial fibrillation: Secondary | ICD-10-CM | POA: Diagnosis not present

## 2017-03-12 DIAGNOSIS — I10 Essential (primary) hypertension: Secondary | ICD-10-CM | POA: Diagnosis not present

## 2017-03-12 DIAGNOSIS — N401 Enlarged prostate with lower urinary tract symptoms: Secondary | ICD-10-CM | POA: Diagnosis not present

## 2017-03-12 DIAGNOSIS — E039 Hypothyroidism, unspecified: Secondary | ICD-10-CM | POA: Diagnosis not present

## 2017-03-12 DIAGNOSIS — Z794 Long term (current) use of insulin: Secondary | ICD-10-CM | POA: Diagnosis not present

## 2017-03-12 DIAGNOSIS — E785 Hyperlipidemia, unspecified: Secondary | ICD-10-CM | POA: Diagnosis not present

## 2017-03-12 DIAGNOSIS — E1165 Type 2 diabetes mellitus with hyperglycemia: Secondary | ICD-10-CM | POA: Diagnosis not present

## 2017-03-12 DIAGNOSIS — I2510 Atherosclerotic heart disease of native coronary artery without angina pectoris: Secondary | ICD-10-CM | POA: Diagnosis not present

## 2017-03-16 DIAGNOSIS — N401 Enlarged prostate with lower urinary tract symptoms: Secondary | ICD-10-CM | POA: Diagnosis not present

## 2017-03-16 DIAGNOSIS — R351 Nocturia: Secondary | ICD-10-CM | POA: Diagnosis not present

## 2017-04-05 DIAGNOSIS — Z794 Long term (current) use of insulin: Secondary | ICD-10-CM | POA: Diagnosis not present

## 2017-04-05 DIAGNOSIS — E1165 Type 2 diabetes mellitus with hyperglycemia: Secondary | ICD-10-CM | POA: Diagnosis not present

## 2017-04-05 DIAGNOSIS — N401 Enlarged prostate with lower urinary tract symptoms: Secondary | ICD-10-CM | POA: Diagnosis not present

## 2017-04-05 DIAGNOSIS — I2510 Atherosclerotic heart disease of native coronary artery without angina pectoris: Secondary | ICD-10-CM | POA: Diagnosis not present

## 2017-04-05 DIAGNOSIS — I10 Essential (primary) hypertension: Secondary | ICD-10-CM | POA: Diagnosis not present

## 2017-04-05 DIAGNOSIS — I480 Paroxysmal atrial fibrillation: Secondary | ICD-10-CM | POA: Diagnosis not present

## 2017-04-05 DIAGNOSIS — E039 Hypothyroidism, unspecified: Secondary | ICD-10-CM | POA: Diagnosis not present

## 2017-04-05 DIAGNOSIS — E785 Hyperlipidemia, unspecified: Secondary | ICD-10-CM | POA: Diagnosis not present

## 2017-04-19 DIAGNOSIS — N401 Enlarged prostate with lower urinary tract symptoms: Secondary | ICD-10-CM | POA: Diagnosis not present

## 2017-04-19 DIAGNOSIS — E1165 Type 2 diabetes mellitus with hyperglycemia: Secondary | ICD-10-CM | POA: Diagnosis not present

## 2017-04-19 DIAGNOSIS — E7800 Pure hypercholesterolemia, unspecified: Secondary | ICD-10-CM | POA: Diagnosis not present

## 2017-04-19 DIAGNOSIS — I10 Essential (primary) hypertension: Secondary | ICD-10-CM | POA: Diagnosis not present

## 2017-04-19 DIAGNOSIS — E039 Hypothyroidism, unspecified: Secondary | ICD-10-CM | POA: Diagnosis not present

## 2017-04-19 DIAGNOSIS — I2510 Atherosclerotic heart disease of native coronary artery without angina pectoris: Secondary | ICD-10-CM | POA: Diagnosis not present

## 2017-04-27 DIAGNOSIS — M519 Unspecified thoracic, thoracolumbar and lumbosacral intervertebral disc disorder: Secondary | ICD-10-CM | POA: Diagnosis not present

## 2017-05-07 ENCOUNTER — Other Ambulatory Visit: Payer: Self-pay | Admitting: Cardiology

## 2017-06-11 ENCOUNTER — Ambulatory Visit: Payer: Medicare HMO | Admitting: Endocrinology

## 2017-06-11 ENCOUNTER — Encounter: Payer: Self-pay | Admitting: Endocrinology

## 2017-06-11 VITALS — BP 152/78 | HR 76 | Wt 233.0 lb

## 2017-06-11 DIAGNOSIS — E119 Type 2 diabetes mellitus without complications: Secondary | ICD-10-CM

## 2017-06-11 DIAGNOSIS — Z794 Long term (current) use of insulin: Secondary | ICD-10-CM | POA: Diagnosis not present

## 2017-06-11 LAB — POCT GLYCOSYLATED HEMOGLOBIN (HGB A1C): Hemoglobin A1C: 7

## 2017-06-11 MED ORDER — INSULIN NPH (HUMAN) (ISOPHANE) 100 UNIT/ML ~~LOC~~ SUSP: 60.0000 [IU] | SUBCUTANEOUS | 11 refills | Status: DC

## 2017-06-11 NOTE — Patient Instructions (Addendum)
check your blood sugar twice a day.  vary the time of day when you check, between before the 3 meals, and at bedtime.  also check if you have symptoms of your blood sugar being too high or too low.  please keep a record of the readings and bring it to your next appointment here (or you can bring the meter itself).  You can write it on any piece of paper.  please call us sooner if your blood sugar goes below 70, or if you have a lot of readings over 200.   Please continue the same medications for diabetes Please come back for a follow-up appointment in 3-4 months.

## 2017-06-11 NOTE — Progress Notes (Signed)
Subjective:    Patient ID: Christopher Patel, male    DOB: February 20, 1945, 72 y.o.   MRN: [id number]  HPI Pt returns for f/u of diabetes mellitus: DM type: Insulin-requiring type 2 Dx'ed: 8250 Complications: none Therapy: insulin since 2017 DKA: never.  Severe hypoglycemia: never.   Pancreatitis: never.  Other: he takes human insulin, due to cost; he declines multiple daily injections.   Interval history: no cbg record, but states cbg's are well-controlled. He takes 60 units each morning.  pt states he feels well in general.   Past Medical History:  Diagnosis Date  . CAD (coronary artery disease)   . Change in voice   . Cough   . Depression   . Diabetes mellitus 04-25-11   oral meds  . ED (erectile dysfunction)   . Hepatic lesion    left lobe  . Hyperlipidemia   . Hypertension    dr Marilynn Rail  . Hypothyroidism   . Lung nodule   . Nasal congestion   . Osteoarthritis 04-25-11   spine area-some neck issues  . Paroxysmal atrial fibrillation (HCC)    started on metoprolol and Xarelto 07/02/2015 for afib RVR  . Pneumonia   . PONV (postoperative nausea and vomiting)   . Thyroid disease    thyroid lesion  . Tubular adenoma    polyps- Dr.Edwards    Past Surgical History:  Procedure Laterality Date  . CARDIAC CATHETERIZATION  04-25-11   many yrs ago-very slight blockage 1 vessel (60% RCA by Dr. Dorthy Cooler notes)  . EYE SURGERY  11/2010   cataract x2  . EYE SURGERY  12/2010   len implant x2   . KNEE SURGERY  04-25-11   right/ left knee scope  . THYROIDECTOMY  05/02/2011   Procedure: THYROIDECTOMY;  Surgeon: Earnstine Regal, MD;  Location: [facility];  Service: General;  Laterality: N/A;  Total Thyroidectomy  . 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 Marital status: Widowed    Spouse name: Not on file  . Number  of children: Not on file  . Years of education: Not on file  . Highest education level: Not on file  Occupational History  . Not on file  Social Needs  . Financial resource strain: Not on file  . Food insecurity:    Worry: Not on file    Inability: Not on file  . Transportation needs:    Medical: Not on file    Non-medical: Not on file  Tobacco Use  . Smoking status: Former Smoker    Years: 50.00    Last attempt to quit: 11/24/2009    Years since quitting: 7.5  . Smokeless tobacco: Never Used  Substance and Sexual Activity  . Alcohol use: Yes    Comment: Beer on weekends, 6 or more  . Drug use: No  . Sexual activity: Never  Lifestyle  . Physical activity:    Days per week: Not on file    Minutes per session: Not on file  . Stress: Not on file  Relationships  . Social connections:    Talks on phone: Not on file    Gets together: Not on file    Attends religious service: Not on file    Active member of club or organization: Not on file  Attends meetings of clubs or organizations: Not on file    Relationship status: Not on file  . Intimate partner violence:    Fear of current or ex partner: Not on file    Emotionally abused: Not on file    Physically abused: Not on file    Forced sexual activity: Not on file  Other Topics Concern  . Not on file  Social History Narrative  . Not on file    Current Outpatient Medications on File Prior to Visit  Medication Sig Dispense Refill  . acetaminophen (TYLENOL) 500 MG tablet Take 500 mg by mouth every 6 (six) hours as needed for moderate pain (BACK PAIN).    Marland Kitchen amLODipine (NORVASC) 10 MG tablet Take 10 mg by mouth daily.    Marland Kitchen aspirin EC 81 MG tablet Take 81 mg by mouth daily.    . B-D INS SYR ULTRAFINE 1CC/30G 30G X 1/2" 1 ML MISC USE TO INJECT INSULIN ONCE DAILY 90 each 2  . fexofenadine (ALLEGRA) 180 MG tablet Take 180 mg by mouth daily as needed. Allergies     . fluticasone (VERAMYST) 27.5 MCG/SPRAY nasal spray Place 2  sprays into the nose daily as needed for allergies.     . hydrochlorothiazide (HYDRODIURIL) 25 MG tablet Take 25 mg by mouth daily.    . irbesartan (AVAPRO) 300 MG tablet Take 300 mg by mouth daily.    Marland Kitchen levothyroxine (SYNTHROID, LEVOTHROID) 150 MCG tablet Take 150 mcg by mouth daily before breakfast.    . metFORMIN (GLUCOPHAGE-XR) 500 MG 24 hr tablet Take 1,000 mg by mouth 2 (two) times daily.    . metoprolol tartrate (LOPRESSOR) 25 MG tablet TAKE 1 TABLET TWICE DAILY 180 tablet 3  . Multiple Vitamin (MULTIVITAMIN) tablet Take 1 tablet by mouth daily.    . pantoprazole (PROTONIX) 40 MG tablet Take 40 mg by mouth daily.     No current facility-administered medications on file prior to visit.     Allergies  Allergen Reactions  . Actos [Pioglitazone Hydrochloride] Nausea Only  . Glyburide Other (See Comments)    hypoglycemia  . Metformin And Related Other (See Comments)    Hypoglycemia, shakes Fast acting metformin  . Codeine Itching    Family History  Problem Relation Age of Onset  . Diabetes Mother   . Diabetes Brother   . Colon cancer Brother   . COPD Other   . Hypertension Other   . Hyperlipidemia Other   . Heart attack Other   . Diabetes Sister     BP (!) 152/78   Pulse 76   Wt 233 lb (105.7 kg)   SpO2 93%   BMI 33.43 kg/m    Review of Systems He denies hypoglycemia.      Objective:   Physical Exam VITAL SIGNS:  See vs page GENERAL: no distress Pulses: dorsalis pedis intact bilat.   MSK: no deformity of the feet CV: trace bilat leg edema Skin:  no ulcer on the feet.  normal color and temp on the feet. Neuro: sensation is intact to touch on the feet  Lab Results  Component Value Date   HGBA1C 7.0 06/11/2017       Assessment & Plan:  Insulin-requiring type 2 DM: this is the best control this pt should aim for, given this regimen, which does match insulin to his changing needs throughout the day.     Patient Instructions  check your blood sugar  twice a day.  vary the time of  day when you check, between before the 3 meals, and at bedtime.  also check if you have symptoms of your blood sugar being too high or too low.  please keep a record of the readings and bring it to your next appointment here (or you can bring the meter itself).  You can write it on any piece of paper.  please call us sooner if your blood sugar goes below 70, or if you have a lot of readings over 200.   Please continue the same medications for diabetes Please come back for a follow-up appointment in 3-4 months.

## 2017-06-29 DIAGNOSIS — M48062 Spinal stenosis, lumbar region with neurogenic claudication: Secondary | ICD-10-CM | POA: Diagnosis not present

## 2017-07-16 DIAGNOSIS — Z7984 Long term (current) use of oral hypoglycemic drugs: Secondary | ICD-10-CM | POA: Diagnosis not present

## 2017-07-16 DIAGNOSIS — I2510 Atherosclerotic heart disease of native coronary artery without angina pectoris: Secondary | ICD-10-CM | POA: Diagnosis not present

## 2017-07-16 DIAGNOSIS — I480 Paroxysmal atrial fibrillation: Secondary | ICD-10-CM | POA: Diagnosis not present

## 2017-07-16 DIAGNOSIS — E1165 Type 2 diabetes mellitus with hyperglycemia: Secondary | ICD-10-CM | POA: Diagnosis not present

## 2017-07-16 DIAGNOSIS — E785 Hyperlipidemia, unspecified: Secondary | ICD-10-CM | POA: Diagnosis not present

## 2017-07-16 DIAGNOSIS — N401 Enlarged prostate with lower urinary tract symptoms: Secondary | ICD-10-CM | POA: Diagnosis not present

## 2017-07-16 DIAGNOSIS — I10 Essential (primary) hypertension: Secondary | ICD-10-CM | POA: Diagnosis not present

## 2017-07-16 DIAGNOSIS — E039 Hypothyroidism, unspecified: Secondary | ICD-10-CM | POA: Diagnosis not present

## 2017-07-18 DIAGNOSIS — E6609 Other obesity due to excess calories: Secondary | ICD-10-CM | POA: Diagnosis not present

## 2017-07-18 DIAGNOSIS — G4700 Insomnia, unspecified: Secondary | ICD-10-CM | POA: Diagnosis not present

## 2017-07-18 DIAGNOSIS — Z6834 Body mass index (BMI) 34.0-34.9, adult: Secondary | ICD-10-CM | POA: Diagnosis not present

## 2017-07-18 DIAGNOSIS — E1165 Type 2 diabetes mellitus with hyperglycemia: Secondary | ICD-10-CM | POA: Diagnosis not present

## 2017-07-18 DIAGNOSIS — I10 Essential (primary) hypertension: Secondary | ICD-10-CM | POA: Diagnosis not present

## 2017-08-02 DIAGNOSIS — M48062 Spinal stenosis, lumbar region with neurogenic claudication: Secondary | ICD-10-CM | POA: Diagnosis not present

## 2017-08-06 ENCOUNTER — Other Ambulatory Visit: Payer: Self-pay | Admitting: Endocrinology

## 2017-08-08 DIAGNOSIS — H5210 Myopia, unspecified eye: Secondary | ICD-10-CM | POA: Diagnosis not present

## 2017-08-20 DIAGNOSIS — M519 Unspecified thoracic, thoracolumbar and lumbosacral intervertebral disc disorder: Secondary | ICD-10-CM | POA: Diagnosis not present

## 2017-08-20 DIAGNOSIS — E1165 Type 2 diabetes mellitus with hyperglycemia: Secondary | ICD-10-CM | POA: Diagnosis not present

## 2017-08-20 DIAGNOSIS — E039 Hypothyroidism, unspecified: Secondary | ICD-10-CM | POA: Diagnosis not present

## 2017-08-20 DIAGNOSIS — Z79899 Other long term (current) drug therapy: Secondary | ICD-10-CM | POA: Diagnosis not present

## 2017-08-20 DIAGNOSIS — I10 Essential (primary) hypertension: Secondary | ICD-10-CM | POA: Diagnosis not present

## 2017-08-21 DIAGNOSIS — M47816 Spondylosis without myelopathy or radiculopathy, lumbar region: Secondary | ICD-10-CM | POA: Diagnosis not present

## 2017-09-06 DIAGNOSIS — M47816 Spondylosis without myelopathy or radiculopathy, lumbar region: Secondary | ICD-10-CM | POA: Diagnosis not present

## 2017-09-10 DIAGNOSIS — E039 Hypothyroidism, unspecified: Secondary | ICD-10-CM | POA: Diagnosis not present

## 2017-09-10 DIAGNOSIS — N401 Enlarged prostate with lower urinary tract symptoms: Secondary | ICD-10-CM | POA: Diagnosis not present

## 2017-09-10 DIAGNOSIS — E785 Hyperlipidemia, unspecified: Secondary | ICD-10-CM | POA: Diagnosis not present

## 2017-09-10 DIAGNOSIS — I2510 Atherosclerotic heart disease of native coronary artery without angina pectoris: Secondary | ICD-10-CM | POA: Diagnosis not present

## 2017-09-10 DIAGNOSIS — E1165 Type 2 diabetes mellitus with hyperglycemia: Secondary | ICD-10-CM | POA: Diagnosis not present

## 2017-09-10 DIAGNOSIS — I480 Paroxysmal atrial fibrillation: Secondary | ICD-10-CM | POA: Diagnosis not present

## 2017-09-10 DIAGNOSIS — Z7984 Long term (current) use of oral hypoglycemic drugs: Secondary | ICD-10-CM | POA: Diagnosis not present

## 2017-09-10 DIAGNOSIS — I10 Essential (primary) hypertension: Secondary | ICD-10-CM | POA: Diagnosis not present

## 2017-09-18 ENCOUNTER — Encounter: Payer: Self-pay | Admitting: Endocrinology

## 2017-09-18 ENCOUNTER — Ambulatory Visit: Payer: Medicare HMO | Admitting: Endocrinology

## 2017-09-18 VITALS — BP 140/84 | HR 68 | Ht 70.0 in | Wt 231.0 lb

## 2017-09-18 DIAGNOSIS — E119 Type 2 diabetes mellitus without complications: Secondary | ICD-10-CM | POA: Diagnosis not present

## 2017-09-18 DIAGNOSIS — Z794 Long term (current) use of insulin: Secondary | ICD-10-CM

## 2017-09-18 LAB — POCT GLYCOSYLATED HEMOGLOBIN (HGB A1C): Hemoglobin A1C: 7.9 % — AB (ref 4.0–5.6)

## 2017-09-18 MED ORDER — INSULIN NPH (HUMAN) (ISOPHANE) 100 UNIT/ML ~~LOC~~ SUSP: 60.0000 [IU] | SUBCUTANEOUS | 11 refills | Status: DC

## 2017-09-18 NOTE — Patient Instructions (Addendum)
check your blood sugar twice a day.  vary the time of day when you check, between before the 3 meals, and at bedtime.  also check if you have symptoms of your blood sugar being too high or too low.  please keep a record of the readings and bring it to your next appointment here (or you can bring the meter itself).  You can write it on any piece of paper.  please call us sooner if your blood sugar goes below 70, or if you have a lot of readings over 200.   Please continue the same medications for diabetes, but take the insulin first thing in the morning.  Please come back for a follow-up appointment in 3 months.

## 2017-09-18 NOTE — Progress Notes (Signed)
Subjective:    Patient ID: Christopher Patel, male    DOB: Jun 09, 1945, 72 y.o.   MRN: [id number]  HPI Pt returns for f/u of diabetes mellitus: DM type: Insulin-requiring type 2 Dx'ed: 2263 Complications: none Therapy: insulin since 2017 DKA: never.  Severe hypoglycemia: never.   Pancreatitis: never.  Other: he takes human insulin, due to cost; he declines multiple daily injections.   Interval history: no cbg record, but states cbg's are well-controlled. He takes 60 units with lunch.  He seldom has hypoglycemia, and these episodes are mild.  This happens in the late evening.  pt states he feels well in general.  He recently had 2 steroid injections into the lower back.   Past Medical History:  Diagnosis Date  . CAD (coronary artery disease)   . Change in voice   . Cough   . Depression   . Diabetes mellitus 04-25-11   oral meds  . ED (erectile dysfunction)   . Hepatic lesion    left lobe  . Hyperlipidemia   . Hypertension    dr Marilynn Rail  . Hypothyroidism   . Lung nodule   . Nasal congestion   . Osteoarthritis 04-25-11   spine area-some neck issues  . Paroxysmal atrial fibrillation (HCC)    started on metoprolol and Xarelto 07/02/2015 for afib RVR  . Pneumonia   . PONV (postoperative nausea and vomiting)   . Thyroid disease    thyroid lesion  . Tubular adenoma    polyps- Dr.Edwards    Past Surgical History:  Procedure Laterality Date  . CARDIAC CATHETERIZATION  04-25-11   many yrs ago-very slight blockage 1 vessel (60% RCA by Dr. Dorthy Cooler notes)  . EYE SURGERY  11/2010   cataract x2  . EYE SURGERY  12/2010   len implant x2   . KNEE SURGERY  04-25-11   right/ left knee scope  . THYROIDECTOMY  05/02/2011   Procedure: THYROIDECTOMY;  Surgeon: Earnstine Regal, MD;  Location: [facility];  Service: General;  Laterality: N/A;  Total Thyroidectomy  . 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 Marital status: Widowed    Spouse name: Not on file  . Number of children: Not on file  . Years of education: Not on file  . Highest education level: Not on file  Occupational History  . Not on file  Social Needs  . Financial resource strain: Not on file  . Food insecurity:    Worry: Not on file    Inability: Not on file  . Transportation needs:    Medical: Not on file    Non-medical: Not on file  Tobacco Use  . Smoking status: Former Smoker    Years: 50.00    Last attempt to quit: 11/24/2009    Years since quitting: 7.8  . Smokeless tobacco: Never Used  Substance and Sexual Activity  . Alcohol use: Yes    Comment: Beer on weekends, 6 or more  . Drug use: No  . Sexual activity: Never  Lifestyle  . Physical activity:    Days per week: Not on file    Minutes per session: Not on file  . Stress: Not on file  Relationships  . Social connections:    Talks on phone: Not on file    Gets  together: Not on file    Attends religious service: Not on file    Active member of club or organization: Not on file    Attends meetings of clubs or organizations: Not on file    Relationship status: Not on file  . Intimate partner violence:    Fear of current or ex partner: Not on file    Emotionally abused: Not on file    Physically abused: Not on file    Forced sexual activity: Not on file  Other Topics Concern  . Not on file  Social History Narrative  . Not on file    Current Outpatient Medications on File Prior to Visit  Medication Sig Dispense Refill  . acetaminophen (TYLENOL) 500 MG tablet Take 500 mg by mouth every 6 (six) hours as needed for moderate pain (BACK PAIN).    Marland Kitchen amLODipine (NORVASC) 10 MG tablet Take 10 mg by mouth daily.    Marland Kitchen aspirin EC 81 MG tablet Take 81 mg by mouth daily.    . B-D INS SYR ULTRAFINE 1CC/30G 30G X 1/2" 1 ML MISC USE TO INJECT INSULIN ONCE DAILY 90 each 2    . fexofenadine (ALLEGRA) 180 MG tablet Take 180 mg by mouth daily as needed. Allergies     . fluticasone (VERAMYST) 27.5 MCG/SPRAY nasal spray Place 2 sprays into the nose daily as needed for allergies.     . hydrochlorothiazide (HYDRODIURIL) 25 MG tablet Take 25 mg by mouth daily.    . irbesartan (AVAPRO) 300 MG tablet Take 300 mg by mouth daily.    Marland Kitchen levothyroxine (SYNTHROID, LEVOTHROID) 150 MCG tablet Take 150 mcg by mouth daily before breakfast.    . metFORMIN (GLUCOPHAGE-XR) 500 MG 24 hr tablet Take 1,000 mg by mouth 2 (two) times daily.    . metoprolol tartrate (LOPRESSOR) 25 MG tablet TAKE 1 TABLET TWICE DAILY 180 tablet 3  . Multiple Vitamin (MULTIVITAMIN) tablet Take 1 tablet by mouth daily.    . pantoprazole (PROTONIX) 40 MG tablet Take 40 mg by mouth daily.     No current facility-administered medications on file prior to visit.     Allergies  Allergen Reactions  . Actos [Pioglitazone Hydrochloride] Nausea Only  . Glyburide Other (See Comments)    hypoglycemia  . Metformin And Related Other (See Comments)    Hypoglycemia, shakes Fast acting metformin  . Codeine Itching    Family History  Problem Relation Age of Onset  . Diabetes Mother   . Diabetes Brother   . Colon cancer Brother   . COPD Other   . Hypertension Other   . Hyperlipidemia Other   . Heart attack Other   . Diabetes Sister     BP 140/84 (BP Location: Left Arm, Patient Position: Sitting, Cuff Size: Normal)   Pulse 68   Ht 5\' 10"  (1.778 m)   Wt 231 lb (104.8 kg)   SpO2 95%   BMI 33.15 kg/m   Review of Systems He denies LOC    Objective:   Physical Exam VITAL SIGNS:  See vs page GENERAL: no distress Pulses: foot pulses are intact bilaterally.   MSK: no deformity of the feet or ankles.  CV: trace bilat edema of the legs or ankles Skin:  no ulcer on the feet or ankles.  normal color and temp on the feet and ankles Neuro: sensation is intact to touch on the feet and ankles.   Ext: There is  bilateral onychomycosis of the toenails  Lab Results  Component Value Date   CREATININE 1.02 09/03/2016   BUN 9 09/03/2016   NA 135 09/03/2016   K 3.6 09/03/2016   CL 100 (L) 09/03/2016   CO2 26 09/03/2016   A1c=7.9%     Assessment & Plan:  Insulin-requiring type 2 DM: this is the best control this pt should aim for, given this regimen, which does match insulin to his changing needs throughout the day Back pain, new: this is affecting a1c.  Patient Instructions  check your blood sugar twice a day.  vary the time of day when you check, between before the 3 meals, and at bedtime.  also check if you have symptoms of your blood sugar being too high or too low.  please keep a record of the readings and bring it to your next appointment here (or you can bring the meter itself).  You can write it on any piece of paper.  please call us sooner if your blood sugar goes below 70, or if you have a lot of readings over 200.   Please continue the same medications for diabetes, but take the insulin first thing in the morning.  Please come back for a follow-up appointment in 3 months.

## 2017-10-12 DIAGNOSIS — Z23 Encounter for immunization: Secondary | ICD-10-CM | POA: Diagnosis not present

## 2017-10-15 DIAGNOSIS — E785 Hyperlipidemia, unspecified: Secondary | ICD-10-CM | POA: Diagnosis not present

## 2017-10-15 DIAGNOSIS — N401 Enlarged prostate with lower urinary tract symptoms: Secondary | ICD-10-CM | POA: Diagnosis not present

## 2017-10-15 DIAGNOSIS — E039 Hypothyroidism, unspecified: Secondary | ICD-10-CM | POA: Diagnosis not present

## 2017-10-15 DIAGNOSIS — E1165 Type 2 diabetes mellitus with hyperglycemia: Secondary | ICD-10-CM | POA: Diagnosis not present

## 2017-10-15 DIAGNOSIS — I480 Paroxysmal atrial fibrillation: Secondary | ICD-10-CM | POA: Diagnosis not present

## 2017-10-15 DIAGNOSIS — I2510 Atherosclerotic heart disease of native coronary artery without angina pectoris: Secondary | ICD-10-CM | POA: Diagnosis not present

## 2017-10-15 DIAGNOSIS — Z7984 Long term (current) use of oral hypoglycemic drugs: Secondary | ICD-10-CM | POA: Diagnosis not present

## 2017-10-15 DIAGNOSIS — I10 Essential (primary) hypertension: Secondary | ICD-10-CM | POA: Diagnosis not present

## 2017-11-22 DIAGNOSIS — E1165 Type 2 diabetes mellitus with hyperglycemia: Secondary | ICD-10-CM | POA: Diagnosis not present

## 2017-11-22 DIAGNOSIS — I480 Paroxysmal atrial fibrillation: Secondary | ICD-10-CM | POA: Diagnosis not present

## 2017-11-22 DIAGNOSIS — N401 Enlarged prostate with lower urinary tract symptoms: Secondary | ICD-10-CM | POA: Diagnosis not present

## 2017-11-22 DIAGNOSIS — I10 Essential (primary) hypertension: Secondary | ICD-10-CM | POA: Diagnosis not present

## 2017-11-22 DIAGNOSIS — E785 Hyperlipidemia, unspecified: Secondary | ICD-10-CM | POA: Diagnosis not present

## 2017-11-22 DIAGNOSIS — E039 Hypothyroidism, unspecified: Secondary | ICD-10-CM | POA: Diagnosis not present

## 2017-11-22 DIAGNOSIS — I2510 Atherosclerotic heart disease of native coronary artery without angina pectoris: Secondary | ICD-10-CM | POA: Diagnosis not present

## 2017-12-24 ENCOUNTER — Ambulatory Visit: Payer: Medicare HMO | Admitting: Endocrinology

## 2017-12-24 ENCOUNTER — Encounter: Payer: Self-pay | Admitting: Endocrinology

## 2017-12-24 VITALS — BP 142/84 | HR 88 | Ht 70.0 in | Wt 234.0 lb

## 2017-12-24 DIAGNOSIS — Z794 Long term (current) use of insulin: Secondary | ICD-10-CM

## 2017-12-24 DIAGNOSIS — E119 Type 2 diabetes mellitus without complications: Secondary | ICD-10-CM | POA: Diagnosis not present

## 2017-12-24 LAB — POCT GLYCOSYLATED HEMOGLOBIN (HGB A1C): Hemoglobin A1C: 8 % — AB (ref 4.0–5.6)

## 2017-12-24 NOTE — Progress Notes (Signed)
Subjective:    Patient ID: Christopher Patel, male    DOB: 1945-08-02, 72 y.o.   MRN: [id number]  HPI Pt returns for f/u of diabetes mellitus: DM type: Insulin-requiring type 2 Dx'ed: 0814 Complications: none Therapy: insulin since 2017, and metformin.   DKA: never.  Severe hypoglycemia: never.   Pancreatitis: never.  Other: he takes human insulin, due to cost; he declines multiple daily injections.   Interval history: no cbg record, but states cbg's are well-controlled. He takes 60 units QD: still with lunch.  He has hypoglycemia approx once per month, and these episodes are mild.  This happens in the late evening.  pt states he feels well in general.  no recent steroids.   Past Medical History:  Diagnosis Date  . CAD (coronary artery disease)   . Change in voice   . Cough   . Depression   . Diabetes mellitus 04-25-11   oral meds  . ED (erectile dysfunction)   . Hepatic lesion    left lobe  . Hyperlipidemia   . Hypertension    dr Marilynn Rail  . Hypothyroidism   . Lung nodule   . Nasal congestion   . Osteoarthritis 04-25-11   spine area-some neck issues  . Paroxysmal atrial fibrillation (HCC)    started on metoprolol and Xarelto 07/02/2015 for afib RVR  . Pneumonia   . PONV (postoperative nausea and vomiting)   . Thyroid disease    thyroid lesion  . Tubular adenoma    polyps- Dr.Edwards    Past Surgical History:  Procedure Laterality Date  . CARDIAC CATHETERIZATION  04-25-11   many yrs ago-very slight blockage 1 vessel (60% RCA by Dr. Dorthy Cooler notes)  . EYE SURGERY  11/2010   cataract x2  . EYE SURGERY  12/2010   len implant x2   . KNEE SURGERY  04-25-11   right/ left knee scope  . THYROIDECTOMY  05/02/2011   Procedure: THYROIDECTOMY;  Surgeon: Earnstine Regal, MD;  Location: [facility];  Service: General;  Laterality: N/A;  Total Thyroidectomy  . TOTAL KNEE ARTHROPLASTY Right 04/25/2013   Procedure: TOTAL KNEE ARTHROPLASTY;  Surgeon: Kerin Salen, MD;  Location: [facility];   Service: Orthopedics;  Laterality: Right;    Social History   Socioeconomic History  . Marital status: Widowed    Spouse name: Not on file  . Number of children: Not on file  . Years of education: Not on file  . Highest education level: Not on file  Occupational History  . Not on file  Social Needs  . Financial resource strain: Not on file  . Food insecurity:    Worry: Not on file    Inability: Not on file  . Transportation needs:    Medical: Not on file    Non-medical: Not on file  Tobacco Use  . Smoking status: Former Smoker    Years: 50.00    Last attempt to quit: 11/24/2009    Years since quitting: 8.0  . Smokeless tobacco: Never Used  Substance and Sexual Activity  . Alcohol use: Yes    Comment: Beer on weekends, 6 or more  . Drug use: No  . Sexual activity: Never  Lifestyle  . Physical activity:    Days per week: Not on file    Minutes per session: Not on file  . Stress: Not on file  Relationships  . Social connections:    Talks on phone: Not on file  Gets together: Not on file    Attends religious service: Not on file    Active member of club or organization: Not on file    Attends meetings of clubs or organizations: Not on file    Relationship status: Not on file  . Intimate partner violence:    Fear of current or ex partner: Not on file    Emotionally abused: Not on file    Physically abused: Not on file    Forced sexual activity: Not on file  Other Topics Concern  . Not on file  Social History Narrative  . Not on file    Current Outpatient Medications on File Prior to Visit  Medication Sig Dispense Refill  . acetaminophen (TYLENOL) 500 MG tablet Take 500 mg by mouth every 6 (six) hours as needed for moderate pain (BACK PAIN).    Marland Kitchen amLODipine (NORVASC) 10 MG tablet Take 10 mg by mouth daily.    Marland Kitchen aspirin EC 81 MG tablet Take 81 mg by mouth daily.    . B-D INS SYR ULTRAFINE 1CC/30G 30G X 1/2" 1 ML MISC USE TO INJECT INSULIN ONCE DAILY 90 each 2    . fexofenadine (ALLEGRA) 180 MG tablet Take 180 mg by mouth daily as needed. Allergies     . fluticasone (VERAMYST) 27.5 MCG/SPRAY nasal spray Place 2 sprays into the nose daily as needed for allergies.     . hydrochlorothiazide (HYDRODIURIL) 25 MG tablet Take 25 mg by mouth daily.    . insulin NPH Human (NOVOLIN N RELION) 100 UNIT/ML injection Inject 0.6 mLs (60 Units total) into the skin every morning. And syringes 1/day 20 mL 11  . irbesartan (AVAPRO) 300 MG tablet Take 300 mg by mouth daily.    Marland Kitchen levothyroxine (SYNTHROID, LEVOTHROID) 150 MCG tablet Take 150 mcg by mouth daily before breakfast.    . metFORMIN (GLUCOPHAGE-XR) 500 MG 24 hr tablet Take 1,000 mg by mouth 2 (two) times daily.    . metoprolol tartrate (LOPRESSOR) 25 MG tablet TAKE 1 TABLET TWICE DAILY 180 tablet 3  . Multiple Vitamin (MULTIVITAMIN) tablet Take 1 tablet by mouth daily.    . pantoprazole (PROTONIX) 40 MG tablet Take 40 mg by mouth daily.     No current facility-administered medications on file prior to visit.     Allergies  Allergen Reactions  . Actos [Pioglitazone Hydrochloride] Nausea Only  . Glyburide Other (See Comments)    hypoglycemia  . Metformin And Related Other (See Comments)    Hypoglycemia, shakes Fast acting metformin  . Codeine Itching    Family History  Problem Relation Age of Onset  . Diabetes Mother   . Diabetes Brother   . Colon cancer Brother   . COPD Other   . Hypertension Other   . Hyperlipidemia Other   . Heart attack Other   . Diabetes Sister     BP (!) 142/84 (BP Location: Left Arm, Patient Position: Sitting, Cuff Size: Normal)   Pulse 88   Ht 5\' 10"  (1.778 m)   Wt 234 lb (106.1 kg)   SpO2 90%   BMI 33.58 kg/m    Review of Systems Denies LOC.      Objective:   Physical Exam VITAL SIGNS:  See vs page GENERAL: no distress Pulses: foot pulses are intact bilaterally.   MSK: no deformity of the feet or ankles.  CV: 1+ bilat edema of the legs or ankles Skin:  no  ulcer on the feet or ankles.  normal  color and temp on the feet and ankles Neuro: sensation is intact to touch on the feet and ankles.   Ext: There is bilateral onychomycosis of the toenails.     Lab Results  Component Value Date   HGBA1C 8.0 (A) 12/24/2017       Assessment & Plan:  Insulin-requiring type 2 DM: worse.   Hypoglycemia: he needs to take insulin earlier in the day.  We discussed.  Patient Instructions  check your blood sugar twice a day.  vary the time of day when you check, between before the 3 meals, and at bedtime.  also check if you have symptoms of your blood sugar being too high or too low.  please keep a record of the readings and bring it to your next appointment here (or you can bring the meter itself).  You can write it on any piece of paper.  please call us sooner if your blood sugar goes below 70, or if you have a lot of readings over 200.   Please continue the same medications for diabetes, but try to take the insulin first thing in the morning.  Please come back for a follow-up appointment in 2 months.

## 2017-12-24 NOTE — Patient Instructions (Addendum)
check your blood sugar twice a day.  vary the time of day when you check, between before the 3 meals, and at bedtime.  also check if you have symptoms of your blood sugar being too high or too low.  please keep a record of the readings and bring it to your next appointment here (or you can bring the meter itself).  You can write it on any piece of paper.  please call us sooner if your blood sugar goes below 70, or if you have a lot of readings over 200.   Please continue the same medications for diabetes, but try to take the insulin first thing in the morning.  Please come back for a follow-up appointment in 2 months.

## 2018-01-21 DIAGNOSIS — Z0001 Encounter for general adult medical examination with abnormal findings: Secondary | ICD-10-CM | POA: Diagnosis not present

## 2018-01-21 DIAGNOSIS — N401 Enlarged prostate with lower urinary tract symptoms: Secondary | ICD-10-CM | POA: Diagnosis not present

## 2018-01-21 DIAGNOSIS — E039 Hypothyroidism, unspecified: Secondary | ICD-10-CM | POA: Diagnosis not present

## 2018-01-21 DIAGNOSIS — E7800 Pure hypercholesterolemia, unspecified: Secondary | ICD-10-CM | POA: Diagnosis not present

## 2018-01-21 DIAGNOSIS — G4700 Insomnia, unspecified: Secondary | ICD-10-CM | POA: Diagnosis not present

## 2018-01-21 DIAGNOSIS — Z23 Encounter for immunization: Secondary | ICD-10-CM | POA: Diagnosis not present

## 2018-01-21 DIAGNOSIS — Z79899 Other long term (current) drug therapy: Secondary | ICD-10-CM | POA: Diagnosis not present

## 2018-01-21 DIAGNOSIS — I10 Essential (primary) hypertension: Secondary | ICD-10-CM | POA: Diagnosis not present

## 2018-01-21 DIAGNOSIS — E1165 Type 2 diabetes mellitus with hyperglycemia: Secondary | ICD-10-CM | POA: Diagnosis not present

## 2018-02-21 DIAGNOSIS — I2510 Atherosclerotic heart disease of native coronary artery without angina pectoris: Secondary | ICD-10-CM | POA: Diagnosis not present

## 2018-02-21 DIAGNOSIS — E785 Hyperlipidemia, unspecified: Secondary | ICD-10-CM | POA: Diagnosis not present

## 2018-02-21 DIAGNOSIS — Z7984 Long term (current) use of oral hypoglycemic drugs: Secondary | ICD-10-CM | POA: Diagnosis not present

## 2018-02-21 DIAGNOSIS — I480 Paroxysmal atrial fibrillation: Secondary | ICD-10-CM | POA: Diagnosis not present

## 2018-02-21 DIAGNOSIS — N401 Enlarged prostate with lower urinary tract symptoms: Secondary | ICD-10-CM | POA: Diagnosis not present

## 2018-02-21 DIAGNOSIS — E039 Hypothyroidism, unspecified: Secondary | ICD-10-CM | POA: Diagnosis not present

## 2018-02-21 DIAGNOSIS — E1165 Type 2 diabetes mellitus with hyperglycemia: Secondary | ICD-10-CM | POA: Diagnosis not present

## 2018-02-21 DIAGNOSIS — I10 Essential (primary) hypertension: Secondary | ICD-10-CM | POA: Diagnosis not present

## 2018-02-25 ENCOUNTER — Encounter: Payer: Self-pay | Admitting: Endocrinology

## 2018-02-25 ENCOUNTER — Ambulatory Visit: Payer: Medicare HMO | Admitting: Endocrinology

## 2018-02-25 VITALS — BP 142/68 | HR 70 | Ht 70.0 in | Wt 238.6 lb

## 2018-02-25 DIAGNOSIS — Z794 Long term (current) use of insulin: Secondary | ICD-10-CM | POA: Diagnosis not present

## 2018-02-25 DIAGNOSIS — E119 Type 2 diabetes mellitus without complications: Secondary | ICD-10-CM

## 2018-02-25 LAB — POCT GLYCOSYLATED HEMOGLOBIN (HGB A1C): Hemoglobin A1C: 7.1 % — AB (ref 4.0–5.6)

## 2018-02-25 MED ORDER — INSULIN NPH (HUMAN) (ISOPHANE) 100 UNIT/ML ~~LOC~~ SUSP: 55.0000 [IU] | SUBCUTANEOUS | 11 refills | Status: DC

## 2018-02-25 NOTE — Progress Notes (Signed)
Subjective:    Patient ID: Christopher Patel, male    DOB: Jun 29, 1945, 73 y.o.   MRN: [id number]  HPI Pt returns for f/u of diabetes mellitus: DM type: Insulin-requiring type 2 Dx'ed: 2119 Complications: none Therapy: insulin since 2017, and metformin.   DKA: never.  Severe hypoglycemia: never.   Pancreatitis: never.  Other: he takes human insulin, due to cost; he declines multiple daily injections.   Interval history: no cbg record, but states cbg's vary from 90-180. He takes 60 units QAM.  He still has hypoglycemia approx once per month, and these episodes are mild.  This happens in the early hrs of the morning.  pt states he feels well in general.  no recent steroids.  He wants to use up current insulin first. Past Medical History:  Diagnosis Date  . CAD (coronary artery disease)   . Change in voice   . Cough   . Depression   . Diabetes mellitus 04-25-11   oral meds  . ED (erectile dysfunction)   . Hepatic lesion    left lobe  . Hyperlipidemia   . Hypertension    dr Marilynn Rail  . Hypothyroidism   . Lung nodule   . Nasal congestion   . Osteoarthritis 04-25-11   spine area-some neck issues  . Paroxysmal atrial fibrillation (HCC)    started on metoprolol and Xarelto 07/02/2015 for afib RVR  . Pneumonia   . PONV (postoperative nausea and vomiting)   . Thyroid disease    thyroid lesion  . Tubular adenoma    polyps- Dr.Edwards    Past Surgical History:  Procedure Laterality Date  . CARDIAC CATHETERIZATION  04-25-11   many yrs ago-very slight blockage 1 vessel (60% RCA by Dr. Dorthy Cooler notes)  . EYE SURGERY  11/2010   cataract x2  . EYE SURGERY  12/2010   len implant x2   . KNEE SURGERY  04-25-11   right/ left knee scope  . THYROIDECTOMY  05/02/2011   Procedure: THYROIDECTOMY;  Surgeon: Earnstine Regal, MD;  Location: [facility];  Service: General;  Laterality: N/A;  Total Thyroidectomy  . TOTAL KNEE ARTHROPLASTY Right 04/25/2013   Procedure: TOTAL KNEE ARTHROPLASTY;  Surgeon:  Kerin Salen, MD;  Location: [facility];  Service: Orthopedics;  Laterality: Right;    Social History   Socioeconomic History  . Marital status: Widowed    Spouse name: Not on file  . Number of children: Not on file  . Years of education: Not on file  . Highest education level: Not on file  Occupational History  . Not on file  Social Needs  . Financial resource strain: Not on file  . Food insecurity:    Worry: Not on file    Inability: Not on file  . Transportation needs:    Medical: Not on file    Non-medical: Not on file  Tobacco Use  . Smoking status: Former Smoker    Years: 50.00    Last attempt to quit: 11/24/2009    Years since quitting: 8.2  . Smokeless tobacco: Never Used  Substance and Sexual Activity  . Alcohol use: Yes    Comment: Beer on weekends, 6 or more  . Drug use: No  . Sexual activity: Never  Lifestyle  . Physical activity:    Days per week: Not on file    Minutes per session: Not on file  . Stress: Not on file  Relationships  . Social connections:  Talks on phone: Not on file    Gets together: Not on file    Attends religious service: Not on file    Active member of club or organization: Not on file    Attends meetings of clubs or organizations: Not on file    Relationship status: Not on file  . Intimate partner violence:    Fear of current or ex partner: Not on file    Emotionally abused: Not on file    Physically abused: Not on file    Forced sexual activity: Not on file  Other Topics Concern  . Not on file  Social History Narrative  . Not on file    Current Outpatient Medications on File Prior to Visit  Medication Sig Dispense Refill  . acetaminophen (TYLENOL) 500 MG tablet Take 500 mg by mouth every 6 (six) hours as needed for moderate pain (BACK PAIN).    Marland Kitchen amLODipine (NORVASC) 10 MG tablet Take 10 mg by mouth daily.    Marland Kitchen aspirin EC 81 MG tablet Take 81 mg by mouth daily.    . B-D INS SYR ULTRAFINE 1CC/30G 30G X 1/2" 1 ML MISC USE TO  INJECT INSULIN ONCE DAILY 90 each 2  . fexofenadine (ALLEGRA) 180 MG tablet Take 180 mg by mouth daily as needed. Allergies     . fluticasone (VERAMYST) 27.5 MCG/SPRAY nasal spray Place 2 sprays into the nose daily as needed for allergies.     . hydrochlorothiazide (HYDRODIURIL) 25 MG tablet Take 25 mg by mouth daily.    . irbesartan (AVAPRO) 300 MG tablet Take 300 mg by mouth daily.    Marland Kitchen levothyroxine (SYNTHROID, LEVOTHROID) 150 MCG tablet Take 150 mcg by mouth daily before breakfast.    . metFORMIN (GLUCOPHAGE-XR) 500 MG 24 hr tablet Take 1,000 mg by mouth 2 (two) times daily.    . metoprolol tartrate (LOPRESSOR) 25 MG tablet TAKE 1 TABLET TWICE DAILY 180 tablet 3  . Multiple Vitamin (MULTIVITAMIN) tablet Take 1 tablet by mouth daily.    . pantoprazole (PROTONIX) 40 MG tablet Take 40 mg by mouth daily.     No current facility-administered medications on file prior to visit.     Allergies  Allergen Reactions  . Actos [Pioglitazone Hydrochloride] Nausea Only  . Glyburide Other (See Comments)    hypoglycemia  . Metformin And Related Other (See Comments)    Hypoglycemia, shakes Fast acting metformin  . Codeine Itching    Family History  Problem Relation Age of Onset  . Diabetes Mother   . Diabetes Brother   . Colon cancer Brother   . COPD Other   . Hypertension Other   . Hyperlipidemia Other   . Heart attack Other   . Diabetes Sister     BP (!) 142/68 (BP Location: Left Arm, Patient Position: Sitting, Cuff Size: Large)   Pulse 70   Ht 5\' 10"  (1.778 m)   Wt 238 lb 9.6 oz (108.2 kg)   SpO2 91%   BMI 34.24 kg/m    Review of Systems Denies LOC    Objective:   Physical Exam VITAL SIGNS:  See vs page GENERAL: no distress Pulses: foot pulses are intact bilaterally.   MSK: no deformity of the feet or ankles.  CV: 1+ bilat edema of the legs or ankles Skin:  no ulcer on the feet or ankles.  normal color and temp on the feet and ankles Neuro: sensation is intact to touch  on the feet and ankles.  Ext: There is bilateral onychomycosis of the toenails.    Lab Results  Component Value Date   HGBA1C 7.1 (A) 02/25/2018   Lab Results  Component Value Date   CREATININE 1.02 09/03/2016   BUN 9 09/03/2016   NA 135 09/03/2016   K 3.6 09/03/2016   CL 100 (L) 09/03/2016   CO2 26 09/03/2016       Assessment & Plan:  Insulin-requiring type 2 DM: this is the best control this pt should aim for, given this regimen, which does match insulin to her changing needs throughout the day.  Hypoglycemia: he prob needs to change to 70/30, but he wants to use up NPH first.  Edema: this limits rx options.   HTN: recheck next time.  Patient Instructions  check your blood sugar twice a day.  vary the time of day when you check, between before the 3 meals, and at bedtime.  also check if you have symptoms of your blood sugar being too high or too low.  please keep a record of the readings and bring it to your next appointment here (or you can bring the meter itself).  You can write it on any piece of paper.  please call us sooner if your blood sugar goes below 70, or if you have a lot of readings over 200.   Please reduce the insulin to 55 units each morning.  Please come back for a follow-up appointment in 2 months.  Then we might need to change to a faster-acting insulin type.

## 2018-02-25 NOTE — Patient Instructions (Addendum)
check your blood sugar twice a day.  vary the time of day when you check, between before the 3 meals, and at bedtime.  also check if you have symptoms of your blood sugar being too high or too low.  please keep a record of the readings and bring it to your next appointment here (or you can bring the meter itself).  You can write it on any piece of paper.  please call us sooner if your blood sugar goes below 70, or if you have a lot of readings over 200.   Please reduce the insulin to 55 units each morning.  Please come back for a follow-up appointment in 2 months.  Then we might need to change to a faster-acting insulin type.

## 2018-03-06 ENCOUNTER — Other Ambulatory Visit: Payer: Self-pay | Admitting: Cardiology

## 2018-04-25 ENCOUNTER — Other Ambulatory Visit: Payer: Self-pay

## 2018-04-26 ENCOUNTER — Ambulatory Visit (INDEPENDENT_AMBULATORY_CARE_PROVIDER_SITE_OTHER): Payer: Medicare HMO | Admitting: Endocrinology

## 2018-04-26 DIAGNOSIS — Z794 Long term (current) use of insulin: Secondary | ICD-10-CM

## 2018-04-26 DIAGNOSIS — E119 Type 2 diabetes mellitus without complications: Secondary | ICD-10-CM | POA: Diagnosis not present

## 2018-04-26 LAB — POCT GLYCOSYLATED HEMOGLOBIN (HGB A1C): Hemoglobin A1C: 6.5 % — AB (ref 4.0–5.6)

## 2018-04-26 NOTE — Patient Instructions (Addendum)
check your blood sugar twice a day.  vary the time of day when you check, between before the 3 meals, and at bedtime.  also check if you have symptoms of your blood sugar being too high or too low.  please keep a record of the readings and bring it to your next appointment here (or you can bring the meter itself).  You can write it on any piece of paper.  please call us sooner if your blood sugar goes below 70, or if you have a lot of readings over 200.   An a1c is requested for you today.  Please come by to have done.  We'll let you know about the results.  Please come back for a follow-up appointment in 2 months.  Then we still might need to change to a faster-acting. insulin type.

## 2018-04-26 NOTE — Progress Notes (Addendum)
Subjective:    Patient ID: Christopher Patel, male    DOB: [birth year], 73 y.o.   MRN: [id number]  HPI  telehealth visit today via telephone visit, x 10 minutes Alternatives to telehealth are presented to this patient, and the patient agrees to the telehealth visit. Pt is advised of the cost of the visit, and agrees to this, also.   Patient is at home, and I am at the office.   Pt returns for f/u of diabetes mellitus: DM type: Insulin-requiring type 2 Dx'ed: 7628 Complications: none Therapy: insulin since 2017, and metformin.   DKA: never.  Severe hypoglycemia: never.   Pancreatitis: never.  Other: he takes human insulin, due to cost; he declines multiple daily injections.   Interval history: pt states cbg's vary from 95-180.  It is in general higher as the day goes on.  pt states he feels well in general.  no recent steroids.  He wants to use up NPH insulin, but he has just gotten a new 90-day supply.   Past Medical History:  Diagnosis Date  . CAD (coronary artery disease)   . Change in voice   . Cough   . Depression   . Diabetes mellitus 04-25-11   oral meds  . ED (erectile dysfunction)   . Hepatic lesion    left lobe  . Hyperlipidemia   . Hypertension    dr Marilynn Rail  . Hypothyroidism   . Lung nodule   . Nasal congestion   . Osteoarthritis 04-25-11   spine area-some neck issues  . Paroxysmal atrial fibrillation (HCC)    started on metoprolol and Xarelto 07/02/2015 for afib RVR  . Pneumonia   . PONV (postoperative nausea and vomiting)   . Thyroid disease    thyroid lesion  . Tubular adenoma    polyps- Dr.Edwards    Past Surgical History:  Procedure Laterality Date  . CARDIAC CATHETERIZATION  04-25-11   many yrs ago-very slight blockage 1 vessel (60% RCA by Dr. Dorthy Cooler notes)  . EYE SURGERY  11/2010   cataract x2  . EYE SURGERY  12/2010   len implant x2   . KNEE SURGERY  04-25-11   right/ left knee scope  . THYROIDECTOMY  05/02/2011   Procedure: THYROIDECTOMY;   Surgeon: Earnstine Regal, MD;  Location: [facility];  Service: General;  Laterality: N/A;  Total Thyroidectomy  . 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 Marital status: Widowed    Spouse name: Not on file  . Number of children: Not on file  . Years of education: Not on file  . Highest education level: Not on file  Occupational History  . Not on file  Social Needs  . Financial resource strain: Not on file  . Food insecurity:    Worry: Not on file    Inability: Not on file  . Transportation needs:    Medical: Not on file    Non-medical: Not on file  Tobacco Use  . Smoking status: Former Smoker    Years: 50.00    Last attempt to quit: 11/24/2009    Years since quitting: 8.4  . Smokeless tobacco: Never Used  Substance and Sexual Activity  . Alcohol use: Yes    Comment: Beer on weekends, 6 or more  . Drug use: No  .  Sexual activity: Never  Lifestyle  . Physical activity:    Days per week: Not on file    Minutes per session: Not on file  . Stress: Not on file  Relationships  . Social connections:    Talks on phone: Not on file    Gets together: Not on file    Attends religious service: Not on file    Active member of club or organization: Not on file    Attends meetings of clubs or organizations: Not on file    Relationship status: Not on file  . Intimate partner violence:    Fear of current or ex partner: Not on file    Emotionally abused: Not on file    Physically abused: Not on file    Forced sexual activity: Not on file  Other Topics Concern  . Not on file  Social History Narrative  . Not on file    Current Outpatient Medications on File Prior to Visit  Medication Sig Dispense Refill  . acetaminophen (TYLENOL) 500 MG tablet Take 500 mg by mouth every 6 (six) hours as needed for moderate pain  (BACK PAIN).    Marland Kitchen amLODipine (NORVASC) 10 MG tablet Take 10 mg by mouth daily.    Marland Kitchen aspirin EC 81 MG tablet Take 81 mg by mouth daily.    . B-D INS SYR ULTRAFINE 1CC/30G 30G X 1/2" 1 ML MISC USE TO INJECT INSULIN ONCE DAILY 90 each 2  . fexofenadine (ALLEGRA) 180 MG tablet Take 180 mg by mouth daily as needed. Allergies     . fluticasone (VERAMYST) 27.5 MCG/SPRAY nasal spray Place 2 sprays into the nose daily as needed for allergies.     . hydrochlorothiazide (HYDRODIURIL) 25 MG tablet Take 25 mg by mouth daily.    . insulin NPH Human (NOVOLIN N RELION) 100 UNIT/ML injection Inject 0.55 mLs (55 Units total) into the skin every morning. And syringes 1/day 20 mL 11  . levothyroxine (SYNTHROID, LEVOTHROID) 150 MCG tablet Take 150 mcg by mouth daily before breakfast.    . metFORMIN (GLUCOPHAGE-XR) 500 MG 24 hr tablet Take 1,000 mg by mouth 2 (two) times daily.    . metoprolol tartrate (LOPRESSOR) 25 MG tablet TAKE 1 TABLET TWICE DAILY 180 tablet 3  . Multiple Vitamin (MULTIVITAMIN) tablet Take 1 tablet by mouth daily.    . pantoprazole (PROTONIX) 40 MG tablet Take 40 mg by mouth daily.     No current facility-administered medications on file prior to visit.     Allergies  Allergen Reactions  . Actos [Pioglitazone Hydrochloride] Nausea Only  . Glyburide Other (See Comments)    hypoglycemia  . Metformin And Related Other (See Comments)    Hypoglycemia, shakes Fast acting metformin  . Codeine Itching    Family History  Problem Relation Age of Onset  . Diabetes Mother   . Diabetes Brother   . Colon cancer Brother   . COPD Other   . Hypertension Other   . Hyperlipidemia Other   . Heart attack Other   . Diabetes Sister       Review of Systems He denies hypoglycemia.     Objective:   Physical Exam        Assessment & Plan:  Insulin-requiring type 2 DM: he is due for a1c.  He may need to change to 70/30, but he has more NPH to use up.  Patient Instructions  check your  blood sugar twice a day.  vary the time of  day when you check, between before the 3 meals, and at bedtime.  also check if you have symptoms of your blood sugar being too high or too low.  please keep a record of the readings and bring it to your next appointment here (or you can bring the meter itself).  You can write it on any piece of paper.  please call us sooner if your blood sugar goes below 70, or if you have a lot of readings over 200.   An a1c is requested for you today.  Please come by to have done.  We'll let you know about the results.  Please come back for a follow-up appointment in 2 months.  Then we still might need to change to a faster-acting. insulin type.

## 2018-05-22 DIAGNOSIS — E7800 Pure hypercholesterolemia, unspecified: Secondary | ICD-10-CM | POA: Diagnosis not present

## 2018-05-22 DIAGNOSIS — E039 Hypothyroidism, unspecified: Secondary | ICD-10-CM | POA: Diagnosis not present

## 2018-05-22 DIAGNOSIS — I480 Paroxysmal atrial fibrillation: Secondary | ICD-10-CM | POA: Diagnosis not present

## 2018-05-22 DIAGNOSIS — N401 Enlarged prostate with lower urinary tract symptoms: Secondary | ICD-10-CM | POA: Diagnosis not present

## 2018-05-22 DIAGNOSIS — E785 Hyperlipidemia, unspecified: Secondary | ICD-10-CM | POA: Diagnosis not present

## 2018-05-22 DIAGNOSIS — I2510 Atherosclerotic heart disease of native coronary artery without angina pectoris: Secondary | ICD-10-CM | POA: Diagnosis not present

## 2018-05-22 DIAGNOSIS — I10 Essential (primary) hypertension: Secondary | ICD-10-CM | POA: Diagnosis not present

## 2018-05-22 DIAGNOSIS — E1165 Type 2 diabetes mellitus with hyperglycemia: Secondary | ICD-10-CM | POA: Diagnosis not present

## 2018-05-23 DIAGNOSIS — I10 Essential (primary) hypertension: Secondary | ICD-10-CM | POA: Diagnosis not present

## 2018-05-23 DIAGNOSIS — M62830 Muscle spasm of back: Secondary | ICD-10-CM | POA: Diagnosis not present

## 2018-05-23 DIAGNOSIS — G4700 Insomnia, unspecified: Secondary | ICD-10-CM | POA: Diagnosis not present

## 2018-06-11 DIAGNOSIS — N401 Enlarged prostate with lower urinary tract symptoms: Secondary | ICD-10-CM | POA: Diagnosis not present

## 2018-06-11 DIAGNOSIS — E1165 Type 2 diabetes mellitus with hyperglycemia: Secondary | ICD-10-CM | POA: Diagnosis not present

## 2018-06-11 DIAGNOSIS — I480 Paroxysmal atrial fibrillation: Secondary | ICD-10-CM | POA: Diagnosis not present

## 2018-06-11 DIAGNOSIS — E039 Hypothyroidism, unspecified: Secondary | ICD-10-CM | POA: Diagnosis not present

## 2018-06-11 DIAGNOSIS — I10 Essential (primary) hypertension: Secondary | ICD-10-CM | POA: Diagnosis not present

## 2018-06-11 DIAGNOSIS — E7800 Pure hypercholesterolemia, unspecified: Secondary | ICD-10-CM | POA: Diagnosis not present

## 2018-06-11 DIAGNOSIS — E785 Hyperlipidemia, unspecified: Secondary | ICD-10-CM | POA: Diagnosis not present

## 2018-06-11 DIAGNOSIS — I2510 Atherosclerotic heart disease of native coronary artery without angina pectoris: Secondary | ICD-10-CM | POA: Diagnosis not present

## 2018-07-23 DIAGNOSIS — M65332 Trigger finger, left middle finger: Secondary | ICD-10-CM | POA: Diagnosis not present

## 2018-07-23 DIAGNOSIS — G4733 Obstructive sleep apnea (adult) (pediatric): Secondary | ICD-10-CM | POA: Diagnosis not present

## 2018-07-23 DIAGNOSIS — Z794 Long term (current) use of insulin: Secondary | ICD-10-CM | POA: Diagnosis not present

## 2018-07-23 DIAGNOSIS — I10 Essential (primary) hypertension: Secondary | ICD-10-CM | POA: Diagnosis not present

## 2018-07-23 DIAGNOSIS — E1165 Type 2 diabetes mellitus with hyperglycemia: Secondary | ICD-10-CM | POA: Diagnosis not present

## 2018-07-23 DIAGNOSIS — E7800 Pure hypercholesterolemia, unspecified: Secondary | ICD-10-CM | POA: Diagnosis not present

## 2018-07-23 DIAGNOSIS — E039 Hypothyroidism, unspecified: Secondary | ICD-10-CM | POA: Diagnosis not present

## 2018-07-23 DIAGNOSIS — F5101 Primary insomnia: Secondary | ICD-10-CM | POA: Diagnosis not present

## 2018-07-30 DIAGNOSIS — M65332 Trigger finger, left middle finger: Secondary | ICD-10-CM | POA: Diagnosis not present

## 2018-08-07 ENCOUNTER — Encounter (HOSPITAL_COMMUNITY): Payer: Self-pay | Admitting: *Deleted

## 2018-08-07 ENCOUNTER — Emergency Department (HOSPITAL_COMMUNITY): Payer: Medicare HMO

## 2018-08-07 ENCOUNTER — Other Ambulatory Visit: Payer: Self-pay

## 2018-08-07 ENCOUNTER — Observation Stay (HOSPITAL_COMMUNITY): Payer: Medicare HMO | Admitting: Anesthesiology

## 2018-08-07 ENCOUNTER — Encounter (HOSPITAL_COMMUNITY): Admission: EM | Disposition: A | Discharge status: Home / Self Care | Payer: Self-pay | Source: Home / Self Care

## 2018-08-07 ENCOUNTER — Inpatient Hospital Stay (HOSPITAL_COMMUNITY)
Admission: EM | Admit: 2018-08-07 | Discharge: 2018-08-09 | DRG: 417 | Disposition: A | Discharge status: Home / Self Care | Payer: Medicare HMO | Attending: General Surgery | Admitting: General Surgery

## 2018-08-07 DIAGNOSIS — E890 Postprocedural hypothyroidism: Secondary | ICD-10-CM | POA: Diagnosis present

## 2018-08-07 DIAGNOSIS — Z86010 Personal history of colonic polyps: Secondary | ICD-10-CM

## 2018-08-07 DIAGNOSIS — K810 Acute cholecystitis: Secondary | ICD-10-CM | POA: Diagnosis not present

## 2018-08-07 DIAGNOSIS — Z20828 Contact with and (suspected) exposure to other viral communicable diseases: Secondary | ICD-10-CM | POA: Diagnosis not present

## 2018-08-07 DIAGNOSIS — Z800 Family history of malignant neoplasm of digestive organs: Secondary | ICD-10-CM | POA: Diagnosis not present

## 2018-08-07 DIAGNOSIS — I480 Paroxysmal atrial fibrillation: Secondary | ICD-10-CM | POA: Diagnosis not present

## 2018-08-07 DIAGNOSIS — K828 Other specified diseases of gallbladder: Secondary | ICD-10-CM | POA: Diagnosis not present

## 2018-08-07 DIAGNOSIS — Z7982 Long term (current) use of aspirin: Secondary | ICD-10-CM | POA: Diagnosis not present

## 2018-08-07 DIAGNOSIS — Z888 Allergy status to other drugs, medicaments and biological substances status: Secondary | ICD-10-CM

## 2018-08-07 DIAGNOSIS — I10 Essential (primary) hypertension: Secondary | ICD-10-CM | POA: Diagnosis not present

## 2018-08-07 DIAGNOSIS — Z7984 Long term (current) use of oral hypoglycemic drugs: Secondary | ICD-10-CM | POA: Diagnosis not present

## 2018-08-07 DIAGNOSIS — Z7901 Long term (current) use of anticoagulants: Secondary | ICD-10-CM

## 2018-08-07 DIAGNOSIS — Z9981 Dependence on supplemental oxygen: Secondary | ICD-10-CM | POA: Diagnosis not present

## 2018-08-07 DIAGNOSIS — I2510 Atherosclerotic heart disease of native coronary artery without angina pectoris: Secondary | ICD-10-CM | POA: Diagnosis not present

## 2018-08-07 DIAGNOSIS — R0902 Hypoxemia: Secondary | ICD-10-CM | POA: Diagnosis not present

## 2018-08-07 DIAGNOSIS — R1084 Generalized abdominal pain: Secondary | ICD-10-CM | POA: Diagnosis not present

## 2018-08-07 DIAGNOSIS — Z79890 Hormone replacement therapy (postmenopausal): Secondary | ICD-10-CM

## 2018-08-07 DIAGNOSIS — K819 Cholecystitis, unspecified: Secondary | ICD-10-CM | POA: Diagnosis not present

## 2018-08-07 DIAGNOSIS — E119 Type 2 diabetes mellitus without complications: Secondary | ICD-10-CM | POA: Diagnosis present

## 2018-08-07 DIAGNOSIS — J449 Chronic obstructive pulmonary disease, unspecified: Secondary | ICD-10-CM | POA: Diagnosis present

## 2018-08-07 DIAGNOSIS — Z79899 Other long term (current) drug therapy: Secondary | ICD-10-CM | POA: Diagnosis not present

## 2018-08-07 DIAGNOSIS — R0602 Shortness of breath: Secondary | ICD-10-CM | POA: Diagnosis not present

## 2018-08-07 DIAGNOSIS — Z87891 Personal history of nicotine dependence: Secondary | ICD-10-CM | POA: Diagnosis not present

## 2018-08-07 DIAGNOSIS — K8020 Calculus of gallbladder without cholecystitis without obstruction: Secondary | ICD-10-CM

## 2018-08-07 DIAGNOSIS — J309 Allergic rhinitis, unspecified: Secondary | ICD-10-CM | POA: Diagnosis not present

## 2018-08-07 DIAGNOSIS — Z885 Allergy status to narcotic agent status: Secondary | ICD-10-CM

## 2018-08-07 DIAGNOSIS — J9602 Acute respiratory failure with hypercapnia: Secondary | ICD-10-CM | POA: Diagnosis not present

## 2018-08-07 DIAGNOSIS — Z825 Family history of asthma and other chronic lower respiratory diseases: Secondary | ICD-10-CM

## 2018-08-07 DIAGNOSIS — K8012 Calculus of gallbladder with acute and chronic cholecystitis without obstruction: Secondary | ICD-10-CM | POA: Diagnosis not present

## 2018-08-07 DIAGNOSIS — Z03818 Encounter for observation for suspected exposure to other biological agents ruled out: Secondary | ICD-10-CM | POA: Diagnosis not present

## 2018-08-07 DIAGNOSIS — R1013 Epigastric pain: Secondary | ICD-10-CM | POA: Diagnosis present

## 2018-08-07 DIAGNOSIS — K8066 Calculus of gallbladder and bile duct with acute and chronic cholecystitis without obstruction: Secondary | ICD-10-CM | POA: Diagnosis not present

## 2018-08-07 DIAGNOSIS — K219 Gastro-esophageal reflux disease without esophagitis: Secondary | ICD-10-CM | POA: Diagnosis present

## 2018-08-07 DIAGNOSIS — K8000 Calculus of gallbladder with acute cholecystitis without obstruction: Principal | ICD-10-CM | POA: Diagnosis present

## 2018-08-07 DIAGNOSIS — Z8379 Family history of other diseases of the digestive system: Secondary | ICD-10-CM | POA: Diagnosis not present

## 2018-08-07 DIAGNOSIS — J9601 Acute respiratory failure with hypoxia: Secondary | ICD-10-CM | POA: Diagnosis not present

## 2018-08-07 DIAGNOSIS — R52 Pain, unspecified: Secondary | ICD-10-CM | POA: Diagnosis not present

## 2018-08-07 HISTORY — PX: CHOLECYSTECTOMY: SHX55

## 2018-08-07 LAB — COMPREHENSIVE METABOLIC PANEL
ALT: 29 U/L (ref 0–44)
AST: 21 U/L (ref 15–41)
Albumin: 3.7 g/dL (ref 3.5–5.0)
Alkaline Phosphatase: 32 U/L — ABNORMAL LOW (ref 38–126)
Anion gap: 12 (ref 5–15)
BUN: 16 mg/dL (ref 8–23)
CO2: 25 mmol/L (ref 22–32)
Calcium: 8.7 mg/dL — ABNORMAL LOW (ref 8.9–10.3)
Chloride: 98 mmol/L (ref 98–111)
Creatinine, Ser: 1.16 mg/dL (ref 0.61–1.24)
GFR calc Af Amer: >60 mL/min (ref >60)
GFR calc non Af Amer: >60 mL/min (ref >60)
Glucose, Bld: 161 mg/dL — ABNORMAL HIGH (ref 70–99)
Potassium: 4.1 mmol/L (ref 3.5–5.1)
Sodium: 135 mmol/L (ref 135–145)
Total Bilirubin: 0.7 mg/dL (ref 0.3–1.2)
Total Protein: 6.1 g/dL — ABNORMAL LOW (ref 6.5–8.1)

## 2018-08-07 LAB — CBC
HCT: 41 % (ref 39.0–52.0)
Hemoglobin: 13.3 g/dL (ref 13.0–17.0)
MCH: 29.8 pg (ref 26.0–34.0)
MCHC: 32.4 g/dL (ref 30.0–36.0)
MCV: 91.7 fL (ref 80.0–100.0)
Platelets: 237 10*3/uL (ref 150–400)
RBC: 4.47 MIL/uL (ref 4.22–5.81)
RDW: 13.3 % (ref 11.5–15.5)
WBC: 15 10*3/uL — ABNORMAL HIGH (ref 4.0–10.5)
nRBC: 0 % (ref 0.0–0.2)

## 2018-08-07 LAB — URINALYSIS, ROUTINE W REFLEX MICROSCOPIC
Bilirubin Urine: NEGATIVE
Glucose, UA: NEGATIVE mg/dL
Hgb urine dipstick: NEGATIVE
Ketones, ur: 5 mg/dL — AB
Leukocytes,Ua: NEGATIVE
Nitrite: NEGATIVE
Protein, ur: NEGATIVE mg/dL
Specific Gravity, Urine: 1.027 (ref 1.005–1.030)
pH: 5 (ref 5.0–8.0)

## 2018-08-07 LAB — LIPASE, BLOOD: Lipase: 34 U/L (ref 11–51)

## 2018-08-07 LAB — GLUCOSE, CAPILLARY
Glucose-Capillary: 135 mg/dL — ABNORMAL HIGH (ref 70–99)
Glucose-Capillary: 156 mg/dL — ABNORMAL HIGH (ref 70–99)
Glucose-Capillary: 164 mg/dL — ABNORMAL HIGH (ref 70–99)
Glucose-Capillary: 188 mg/dL — ABNORMAL HIGH (ref 70–99)
Glucose-Capillary: 195 mg/dL — ABNORMAL HIGH (ref 70–99)

## 2018-08-07 LAB — SARS CORONAVIRUS 2 BY RT PCR (HOSPITAL ORDER, PERFORMED IN ~~LOC~~ HOSPITAL LAB): SARS Coronavirus 2: NEGATIVE

## 2018-08-07 SURGERY — LAPAROSCOPIC CHOLECYSTECTOMY
Anesthesia: General | Site: Abdomen

## 2018-08-07 MED ORDER — ROCURONIUM BROMIDE 10 MG/ML (PF) SYRINGE
PREFILLED_SYRINGE | INTRAVENOUS | Status: DC | PRN
  Administered 2018-08-07: 60 mg via INTRAVENOUS

## 2018-08-07 MED ORDER — POLYETHYLENE GLYCOL 3350 17 G PO PACK: 17.0000 g | PACK | Freq: Every day | ORAL | Status: DC | PRN

## 2018-08-07 MED ORDER — 0.9 % SODIUM CHLORIDE (POUR BTL) OPTIME
TOPICAL | Status: DC | PRN
  Administered 2018-08-07: 1000 mL

## 2018-08-07 MED ORDER — FENTANYL CITRATE (PF) 100 MCG/2ML IJ SOLN
25.0000 ug | INTRAMUSCULAR | Status: DC | PRN
  Administered 2018-08-07: 25 ug via INTRAVENOUS
  Administered 2018-08-07: 50 ug via INTRAVENOUS
  Administered 2018-08-07 (×2): 25 ug via INTRAVENOUS

## 2018-08-07 MED ORDER — ONDANSETRON HCL 4 MG/2ML IJ SOLN
4.0000 mg | Freq: Once | INTRAMUSCULAR | Status: AC
  Administered 2018-08-07: 4 mg via INTRAVENOUS
  Filled 2018-08-07: qty 2

## 2018-08-07 MED ORDER — DEXMEDETOMIDINE HCL IN NACL 80 MCG/20ML IV SOLN
INTRAVENOUS | Status: AC
  Filled 2018-08-07: qty 20

## 2018-08-07 MED ORDER — MIDAZOLAM HCL 2 MG/2ML IJ SOLN
INTRAMUSCULAR | Status: AC
  Filled 2018-08-07: qty 2

## 2018-08-07 MED ORDER — KCL IN DEXTROSE-NACL 20-5-0.45 MEQ/L-%-% IV SOLN
INTRAVENOUS | Status: DC
  Administered 2018-08-07 – 2018-08-08 (×3): via INTRAVENOUS
  Filled 2018-08-07 (×4): qty 1000

## 2018-08-07 MED ORDER — ROCURONIUM BROMIDE 10 MG/ML (PF) SYRINGE
PREFILLED_SYRINGE | INTRAVENOUS | Status: AC
  Filled 2018-08-07: qty 20

## 2018-08-07 MED ORDER — SODIUM CHLORIDE 0.9% FLUSH
3.0000 mL | Freq: Once | INTRAVENOUS | Status: AC
  Administered 2018-08-07: 3 mL via INTRAVENOUS

## 2018-08-07 MED ORDER — FENTANYL CITRATE (PF) 250 MCG/5ML IJ SOLN
INTRAMUSCULAR | Status: AC
  Filled 2018-08-07: qty 5

## 2018-08-07 MED ORDER — METOPROLOL TARTRATE 5 MG/5ML IV SOLN: 5.0000 mg | Freq: Four times a day (QID) | INTRAVENOUS | Status: DC | PRN

## 2018-08-07 MED ORDER — FENTANYL CITRATE (PF) 250 MCG/5ML IJ SOLN
INTRAMUSCULAR | Status: DC | PRN
  Administered 2018-08-07 (×2): 50 ug via INTRAVENOUS
  Administered 2018-08-07: 150 ug via INTRAVENOUS

## 2018-08-07 MED ORDER — FENTANYL CITRATE (PF) 100 MCG/2ML IJ SOLN
INTRAMUSCULAR | Status: AC
  Filled 2018-08-07: qty 2

## 2018-08-07 MED ORDER — DIPHENHYDRAMINE HCL 50 MG/ML IJ SOLN: 12.5000 mg | Freq: Four times a day (QID) | INTRAMUSCULAR | Status: DC | PRN

## 2018-08-07 MED ORDER — OXYCODONE HCL 5 MG PO TABS
5.0000 mg | ORAL_TABLET | Freq: Four times a day (QID) | ORAL | Status: DC | PRN
  Administered 2018-08-07 – 2018-08-09 (×7): 5 mg via ORAL
  Filled 2018-08-07 (×7): qty 1

## 2018-08-07 MED ORDER — MIDAZOLAM HCL 2 MG/2ML IJ SOLN
INTRAMUSCULAR | Status: DC | PRN
  Administered 2018-08-07 (×2): 2 mg via INTRAVENOUS

## 2018-08-07 MED ORDER — BUPIVACAINE-EPINEPHRINE 0.25% -1:200000 IJ SOLN
INTRAMUSCULAR | Status: DC | PRN
  Administered 2018-08-07: 30 mL

## 2018-08-07 MED ORDER — SODIUM CHLORIDE 0.9 % IV SOLN
INTRAVENOUS | Status: DC | PRN
  Administered 2018-08-07: 11:00:00 100 ug/min via INTRAVENOUS

## 2018-08-07 MED ORDER — EPHEDRINE 5 MG/ML INJ
INTRAVENOUS | Status: AC
  Filled 2018-08-07: qty 10

## 2018-08-07 MED ORDER — ACETAMINOPHEN 325 MG PO TABS
650.0000 mg | ORAL_TABLET | Freq: Four times a day (QID) | ORAL | Status: DC | PRN
  Administered 2018-08-07 – 2018-08-08 (×2): 650 mg via ORAL
  Filled 2018-08-07 (×2): qty 2

## 2018-08-07 MED ORDER — SUGAMMADEX SODIUM 200 MG/2ML IV SOLN
INTRAVENOUS | Status: DC | PRN
  Administered 2018-08-07: 400 mg via INTRAVENOUS

## 2018-08-07 MED ORDER — BUPIVACAINE-EPINEPHRINE (PF) 0.5% -1:200000 IJ SOLN
INTRAMUSCULAR | Status: AC
  Filled 2018-08-07: qty 30

## 2018-08-07 MED ORDER — PHENYLEPHRINE 40 MCG/ML (10ML) SYRINGE FOR IV PUSH (FOR BLOOD PRESSURE SUPPORT)
PREFILLED_SYRINGE | INTRAVENOUS | Status: AC
  Filled 2018-08-07: qty 10

## 2018-08-07 MED ORDER — FENTANYL CITRATE (PF) 100 MCG/2ML IJ SOLN
50.0000 ug | Freq: Once | INTRAMUSCULAR | Status: AC
  Administered 2018-08-07: 50 ug via INTRAVENOUS
  Filled 2018-08-07: qty 2

## 2018-08-07 MED ORDER — PROPOFOL 10 MG/ML IV BOLUS
INTRAVENOUS | Status: DC | PRN
  Administered 2018-08-07: 150 mg via INTRAVENOUS

## 2018-08-07 MED ORDER — ONDANSETRON HCL 4 MG/2ML IJ SOLN
4.0000 mg | Freq: Four times a day (QID) | INTRAMUSCULAR | Status: DC | PRN
  Administered 2018-08-07: 4 mg via INTRAVENOUS

## 2018-08-07 MED ORDER — LIDOCAINE 2% (20 MG/ML) 5 ML SYRINGE
INTRAMUSCULAR | Status: AC
  Filled 2018-08-07: qty 10

## 2018-08-07 MED ORDER — HYDROMORPHONE HCL 1 MG/ML IJ SOLN
1.0000 mg | Freq: Once | INTRAMUSCULAR | Status: AC
  Administered 2018-08-07: 1 mg via INTRAVENOUS
  Filled 2018-08-07: qty 1

## 2018-08-07 MED ORDER — SUCCINYLCHOLINE CHLORIDE 200 MG/10ML IV SOSY
PREFILLED_SYRINGE | INTRAVENOUS | Status: AC
  Filled 2018-08-07: qty 10

## 2018-08-07 MED ORDER — PHENYLEPHRINE HCL (PRESSORS) 10 MG/ML IV SOLN
INTRAVENOUS | Status: DC | PRN
  Administered 2018-08-07 (×3): 200 ug via INTRAVENOUS

## 2018-08-07 MED ORDER — MORPHINE SULFATE (PF) 2 MG/ML IV SOLN: 2.0000 mg | INTRAVENOUS | Status: DC | PRN

## 2018-08-07 MED ORDER — DEXMEDETOMIDINE HCL IN NACL 200 MCG/50ML IV SOLN
INTRAVENOUS | Status: DC | PRN
  Administered 2018-08-07: 28 ug via INTRAVENOUS

## 2018-08-07 MED ORDER — EPHEDRINE SULFATE-NACL 50-0.9 MG/10ML-% IV SOSY
PREFILLED_SYRINGE | INTRAVENOUS | Status: DC | PRN
  Administered 2018-08-07: 5 mg via INTRAVENOUS

## 2018-08-07 MED ORDER — DEXAMETHASONE SODIUM PHOSPHATE 10 MG/ML IJ SOLN
INTRAMUSCULAR | Status: DC | PRN
  Administered 2018-08-07: 4 mg via INTRAVENOUS

## 2018-08-07 MED ORDER — IOHEXOL 300 MG/ML  SOLN
100.0000 mL | Freq: Once | INTRAMUSCULAR | Status: AC | PRN
  Administered 2018-08-07: 100 mL via INTRAVENOUS

## 2018-08-07 MED ORDER — SODIUM CHLORIDE 0.9 % IV SOLN
2.0000 g | INTRAVENOUS | Status: DC
  Administered 2018-08-07 – 2018-08-08 (×2): 2 g via INTRAVENOUS
  Filled 2018-08-07: qty 2
  Filled 2018-08-07 (×3): qty 20

## 2018-08-07 MED ORDER — ACETAMINOPHEN 650 MG RE SUPP: 650.0000 mg | Freq: Four times a day (QID) | RECTAL | Status: DC | PRN

## 2018-08-07 MED ORDER — ONDANSETRON 4 MG PO TBDP: 4.0000 mg | ORAL_TABLET | Freq: Four times a day (QID) | ORAL | Status: DC | PRN

## 2018-08-07 MED ORDER — ONDANSETRON HCL 4 MG/2ML IJ SOLN: 4.0000 mg | Freq: Once | INTRAMUSCULAR | Status: DC | PRN

## 2018-08-07 MED ORDER — BUPIVACAINE-EPINEPHRINE (PF) 0.25% -1:200000 IJ SOLN
INTRAMUSCULAR | Status: AC
  Filled 2018-08-07: qty 30

## 2018-08-07 MED ORDER — LIDOCAINE 2% (20 MG/ML) 5 ML SYRINGE
INTRAMUSCULAR | Status: DC | PRN
  Administered 2018-08-07: 60 mg via INTRAVENOUS

## 2018-08-07 MED ORDER — LACTATED RINGERS IV SOLN
INTRAVENOUS | Status: DC
  Administered 2018-08-07 (×2): via INTRAVENOUS

## 2018-08-07 MED ORDER — SODIUM CHLORIDE 0.9 % IR SOLN
Status: DC | PRN
  Administered 2018-08-07: 1000 mL

## 2018-08-07 MED ORDER — INSULIN ASPART 100 UNIT/ML ~~LOC~~ SOLN
0.0000 [IU] | SUBCUTANEOUS | Status: DC
  Administered 2018-08-07 – 2018-08-08 (×5): 3 [IU] via SUBCUTANEOUS
  Administered 2018-08-08: 2 [IU] via SUBCUTANEOUS
  Administered 2018-08-08 (×3): 3 [IU] via SUBCUTANEOUS
  Administered 2018-08-09: 2 [IU] via SUBCUTANEOUS
  Administered 2018-08-09: 3 [IU] via SUBCUTANEOUS
  Administered 2018-08-09: 2 [IU] via SUBCUTANEOUS
  Administered 2018-08-09: 3 [IU] via SUBCUTANEOUS

## 2018-08-07 MED ORDER — DIPHENHYDRAMINE HCL 12.5 MG/5ML PO ELIX: 12.5000 mg | ORAL_SOLUTION | Freq: Four times a day (QID) | ORAL | Status: DC | PRN

## 2018-08-07 SURGICAL SUPPLY — 36 items
BLADE CLIPPER SURG (BLADE) IMPLANT
CANISTER SUCT 3000ML PPV (MISCELLANEOUS) ×3 IMPLANT
CHLORAPREP W/TINT 26 (MISCELLANEOUS) ×3 IMPLANT
CLIP VESOLOCK MED LG 6/CT (CLIP) ×3 IMPLANT
CLIP VESOLOCK XL 6/CT (CLIP) IMPLANT
COVER SURGICAL LIGHT HANDLE (MISCELLANEOUS) ×3 IMPLANT
COVER WAND RF STERILE (DRAPES) ×3 IMPLANT
DERMABOND ADVANCED (GAUZE/BANDAGES/DRESSINGS) ×2
DERMABOND ADVANCED .7 DNX12 (GAUZE/BANDAGES/DRESSINGS) ×1 IMPLANT
ELECT REM PT RETURN 9FT ADLT (ELECTROSURGICAL) ×3
ELECTRODE REM PT RTRN 9FT ADLT (ELECTROSURGICAL) ×1 IMPLANT
GLOVE BIOGEL PI IND STRL 7.0 (GLOVE) ×1 IMPLANT
GLOVE BIOGEL PI INDICATOR 7.0 (GLOVE) ×2
GLOVE SURG SS PI 7.0 STRL IVOR (GLOVE) ×3 IMPLANT
GOWN STRL REUS W/ TWL LRG LVL3 (GOWN DISPOSABLE) ×3 IMPLANT
GOWN STRL REUS W/TWL LRG LVL3 (GOWN DISPOSABLE) ×6
GRASPER SUT TROCAR 14GX15 (MISCELLANEOUS) ×3 IMPLANT
KIT BASIN OR (CUSTOM PROCEDURE TRAY) ×3 IMPLANT
KIT TURNOVER KIT B (KITS) ×3 IMPLANT
NEEDLE 22X1 1/2 (OR ONLY) (NEEDLE) ×3 IMPLANT
NS IRRIG 1000ML POUR BTL (IV SOLUTION) ×3 IMPLANT
PAD ARMBOARD 7.5X6 YLW CONV (MISCELLANEOUS) ×3 IMPLANT
POUCH RETRIEVAL ECOSAC 10 (ENDOMECHANICALS) ×1 IMPLANT
POUCH RETRIEVAL ECOSAC 10MM (ENDOMECHANICALS) ×2
SCISSORS LAP 5X35 DISP (ENDOMECHANICALS) ×3 IMPLANT
SET IRRIG TUBING LAPAROSCOPIC (IRRIGATION / IRRIGATOR) ×3 IMPLANT
SET TUBE SMOKE EVAC HIGH FLOW (TUBING) ×3 IMPLANT
SLEEVE ENDOPATH XCEL 5M (ENDOMECHANICALS) ×6 IMPLANT
SPECIMEN JAR SMALL (MISCELLANEOUS) ×3 IMPLANT
SUT MNCRL AB 4-0 PS2 18 (SUTURE) ×3 IMPLANT
TOWEL GREEN STERILE (TOWEL DISPOSABLE) ×3 IMPLANT
TOWEL GREEN STERILE FF (TOWEL DISPOSABLE) ×3 IMPLANT
TRAY LAPAROSCOPIC MC (CUSTOM PROCEDURE TRAY) ×3 IMPLANT
TROCAR XCEL 12X100 BLDLESS (ENDOMECHANICALS) ×3 IMPLANT
TROCAR XCEL NON-BLD 5MMX100MML (ENDOMECHANICALS) ×3 IMPLANT
WATER STERILE IRR 1000ML POUR (IV SOLUTION) ×3 IMPLANT

## 2018-08-07 NOTE — Progress Notes (Signed)
Dr Marcie Bal updated-OK to tx pt to room.  Dr Kieth Brightly also updated.

## 2018-08-07 NOTE — Op Note (Signed)
PATIENT:  Christopher Patel  73 y.o. male  PRE-OPERATIVE DIAGNOSIS:  cholecystitis  POST-OPERATIVE DIAGNOSIS:  cholecystitis  PROCEDURE:  Procedure(s): LAPAROSCOPIC CHOLECYSTECTOMY   SURGEON:  Surgeon(s): Troy Kanouse, Arta Bruce, MD  ASSISTANT: Jackson Latino  ANESTHESIA:   local and general  Indications for procedure: Christopher Patel is a 73 y.o. male with symptoms of Abdominal pain consistent with gallbladder disease, Confirmed by Ultrasound.  Description of procedure: The patient was brought into the operative suite, placed supine. Anesthesia was administered with endotracheal tube. Patient was strapped in place and foot board was secured. All pressure points were offloaded by foam padding. The patient was prepped and draped in the usual sterile fashion.  A small incision was made to the right of the umbilicus. A 69mm trocar was inserted into the peritoneal cavity with optical entry. Pneumoperitoneum was applied with high flow low pressure. 2 32mm trocars were placed in the RUQ. A 67mm trocar was placed in the subxiphoid space. Marcaine was infused to the subxiphoid space and lateral upper right abdomen in the transversus abdominis plane. Next the patient was placed in reverse trendelenberg. The gallbladder was distended and inflamed.  The gallbladder was retracted cephalad and lateral. The peritoneum was reflected off the infundibulum working lateral to medial. The cystic duct and cystic artery were identified and further dissection revealed a critical view. The cystic duct and cystic artery were doubly clipped and ligated.   The gallbladder was removed off the liver bed with cautery. There was some drainage during removal which was irrigated. The Gallbladder was placed in a specimen bag. The gallbladder fossa was irrigated and hemostasis was applied with cautery. The gallbladder was removed via the 67mm trocar. No dilation was required for removal, therefore no fascial closure was  performed. Pneumoperitoneum was removed, all trocar were removed. All incisions were closed with 4-0 monocryl subcuticular stitch. The patient woke from anesthesia and was brought to PACU in stable condition. All counts were correct  Findings: acute cholecystitis  Specimen: gallbladder  Blood loss: 30 ml  Local anesthesia: 30 ml marcaine  Complications: none  PLAN OF CARE: Admit to inpatient   PATIENT DISPOSITION:  PACU - hemodynamically stable.  Gurney Maxin, M.D. General, Bariatric, & Minimally Invasive Surgery [facility]

## 2018-08-07 NOTE — ED Notes (Signed)
Patient transported to US 

## 2018-08-07 NOTE — ED Triage Notes (Signed)
Pt arrives from home via [facility], Right lower quad abdominal pain radiates to the back started around 2200 last night, feels like gas, took gas x without relief. Vomited x 1, now nauseated. 150/62, hr 84, sat 97% RA, r 18, cbg 187. 20 gauge in the left ac established en route.

## 2018-08-07 NOTE — Transfer of Care (Addendum)
Immediate Anesthesia Transfer of Care Note  Patient: Christopher Patel  Procedure(s) Performed: LAPAROSCOPIC CHOLECYSTECTOMY (N/A Abdomen)  Patient Location: PACU  Anesthesia Type:General  Level of Consciousness: awake, pateint uncooperative and confused  Airway & Oxygen Therapy: Patient Spontanous Breathing and Patient connected to face mask oxygen  Post-op Assessment: Report given to RN, Post -op Vital signs reviewed and stable and Patient moving all extremities  Post vital signs: Reviewed and stable  Last Vitals:  Vitals Value Taken Time  BP 130/60 08/07/18 1129  Temp 36.1 C 08/07/18 1128  Pulse 91 08/07/18 1136  Resp 20 08/07/18 1136  SpO2 95 % 08/07/18 1136  Vitals shown include unvalidated device data.  Last Pain:  Vitals:   08/07/18 0642  TempSrc:   PainSc: 8          Complications: No apparent anesthesia complications Emergence delirium

## 2018-08-07 NOTE — ED Provider Notes (Signed)
[facility] EMERGENCY DEPARTMENT Provider Note   CSN: [id number] Arrival date & time: 08/07/18  0204     History   Chief Complaint Chief Complaint  Patient presents with   Abdominal Pain    HPI Christopher Patel is a 73 y.o. male.     Patient presents to the emergency department with a chief complaint of right upper abdominal pain.  He states the pain started several hours ago.  He rates the pain is severe.  He reports associated nausea with one episode of vomiting.  He reports having had inflammation around his gallbladder in the past, but still has his gallbladder.  He denies any fevers or chills.  He has not taken anything for symptoms.  Denies any lower abdominal pain.  Denies any painful urination.  The history is provided by the patient. No language interpreter was used.    Past Medical History:  Diagnosis Date   CAD (coronary artery disease)    Change in voice    Cough    Depression    Diabetes mellitus 04-25-11   oral meds   ED (erectile dysfunction)    Hepatic lesion    left lobe   Hyperlipidemia    Hypertension    dr Marilynn Rail   Hypothyroidism    Lung nodule    Nasal congestion    Osteoarthritis 04-25-11   spine area-some neck issues   Paroxysmal atrial fibrillation ([place])    started on metoprolol and Xarelto 07/02/2015 for afib RVR   Pneumonia    PONV (postoperative nausea and vomiting)    Thyroid disease    thyroid lesion   Tubular adenoma    polyps- Dr.Edwards    Patient Active Problem List   Diagnosis Date Noted   RUQ abdominal pain 08/31/2016   Transaminitis 08/31/2016   SIRS (systemic inflammatory response syndrome) ([place]) 08/31/2016   Lactic acidosis 08/31/2016   Paroxysmal atrial fibrillation with RVR ([facility]) 07/02/2015   Status post right knee replacement 05/04/2013   Essential hypertension, benign 05/04/2013   Diabetes mellitus ([place]) 05/04/2013   Dyslipidemia 05/04/2013   Allergic rhinitis  05/04/2013   Abdominal pain, unspecified site 04/27/2013   Arthritis of right knee 04/25/2013   Hypothyroidism, postsurgical 05/17/2011   Multinodular goiter (nontoxic) 04/05/2011    Past Surgical History:  Procedure Laterality Date   CARDIAC CATHETERIZATION  04-25-11   many yrs ago-very slight blockage 1 vessel (60% RCA by Dr. Dorthy Cooler notes)   EYE SURGERY  11/2010   cataract x2   EYE SURGERY  12/2010   len implant x2    KNEE SURGERY  04-25-11   right/ left knee scope   THYROIDECTOMY  05/02/2011   Procedure: THYROIDECTOMY;  Surgeon: Earnstine Regal, MD;  Location: [facility];  Service: General;  Laterality: N/A;  Total Thyroidectomy   TOTAL KNEE ARTHROPLASTY Right 04/25/2013   Procedure: TOTAL KNEE ARTHROPLASTY;  Surgeon: Kerin Salen, MD;  Location: [place];  Service: Orthopedics;  Laterality: Right;        Home Medications    Prior to Admission medications   Medication Sig Start Date End Date Taking? Authorizing Provider  acetaminophen (TYLENOL) 500 MG tablet Take 500 mg by mouth every 6 (six) hours as needed for moderate pain (BACK PAIN).    [provider]  amLODipine (NORVASC) 10 MG tablet Take 10 mg by mouth daily.    [provider]  aspirin EC 81 MG tablet Take 81 mg by mouth daily.  [provider]  B-D INS SYR ULTRAFINE 1CC/30G 30G X 1/2" 1 ML MISC USE TO INJECT INSULIN ONCE DAILY 12/26/16   Renato Shin, MD  fexofenadine (ALLEGRA) 180 MG tablet Take 180 mg by mouth daily as needed. Allergies     [provider]  fluticasone (VERAMYST) 27.5 MCG/SPRAY nasal spray Place 2 sprays into the nose daily as needed for allergies.     [provider]  hydrochlorothiazide (HYDRODIURIL) 25 MG tablet Take 25 mg by mouth daily.    [provider]  insulin NPH Human (NOVOLIN N RELION) 100 UNIT/ML injection Inject 0.55 mLs (55 Units total) into the skin every morning. And syringes 1/day 02/25/18   Renato Shin, MD  levothyroxine  (SYNTHROID, LEVOTHROID) 150 MCG tablet Take 150 mcg by mouth daily before breakfast.    [provider]  metFORMIN (GLUCOPHAGE-XR) 500 MG 24 hr tablet Take 1,000 mg by mouth 2 (two) times daily.    [provider]  metoprolol tartrate (LOPRESSOR) 25 MG tablet TAKE 1 TABLET TWICE DAILY 03/06/18   Jerline Pain, MD  Multiple Vitamin (MULTIVITAMIN) tablet Take 1 tablet by mouth daily.    [provider]  pantoprazole (PROTONIX) 40 MG tablet Take 40 mg by mouth daily.    [provider]    Family History Family History  Problem Relation Age of Onset   Diabetes Mother    Diabetes Brother    Colon cancer Brother    COPD Other    Hypertension Other    Hyperlipidemia Other    Heart attack Other    Diabetes Sister     Social History Social History   Tobacco Use   Smoking status: Former Smoker    Years: 50.00    Quit date: 11/24/2009    Years since quitting: 8.7   Smokeless tobacco: Never Used  Substance Use Topics   Alcohol use: Yes    Comment: Beer on weekends, 6 or more   Drug use: No     Allergies   Actos [pioglitazone hydrochloride], Glyburide, Metformin and related, and Codeine   Review of Systems Review of Systems  All other systems reviewed and are negative.    Physical Exam Updated Vital Signs BP (!) 134/51 (BP Location: Right Arm)    Pulse (!) 54    Temp (!) 97.3 F (36.3 C) (Oral)    Resp 18    Ht 5\' 9"  (1.753 m)    Wt 99.8 kg    SpO2 96%    BMI 32.49 kg/m   Physical Exam Vitals signs and nursing note reviewed.  Constitutional:      Appearance: He is well-developed.  HENT:     Head: Normocephalic and atraumatic.  Eyes:     Conjunctiva/sclera: Conjunctivae normal.  Neck:     Musculoskeletal: Neck supple.  Cardiovascular:     Rate and Rhythm: Normal rate and regular rhythm.     Heart sounds: No murmur.  Pulmonary:     Effort: Pulmonary effort is normal. No respiratory distress.     Breath sounds: Normal  breath sounds.  Abdominal:     Palpations: Abdomen is soft.     Tenderness: There is abdominal tenderness.     Comments: Right upper quadrant significantly tender to palpation there is guarding  Skin:    General: Skin is warm and dry.  Neurological:     General: No focal deficit present.     Mental Status: He is alert.  Psychiatric:  Mood and Affect: Mood normal.        Behavior: Behavior normal.      ED Treatments / Results  Labs (all labs ordered are listed, but only abnormal results are displayed) Labs Reviewed  COMPREHENSIVE METABOLIC PANEL - Abnormal; Notable for the following components:      Result Value   Glucose, Bld 161 (*)    Calcium 8.7 (*)    Total Protein 6.1 (*)    Alkaline Phosphatase 32 (*)    All other components within normal limits  CBC - Abnormal; Notable for the following components:   WBC 15.0 (*)    All other components within normal limits  URINALYSIS, ROUTINE W REFLEX MICROSCOPIC - Abnormal; Notable for the following components:   Ketones, ur 5 (*)    All other components within normal limits  SARS CORONAVIRUS 2 (HOSPITAL ORDER, [place] [facility])  LIPASE, BLOOD    EKG None  Radiology Ct Abdomen Pelvis W Contrast  Result Date: 08/07/2018 CLINICAL DATA:  Right lower quadrant pain radiating to the back EXAM: CT ABDOMEN AND PELVIS WITH CONTRAST TECHNIQUE: Multidetector CT imaging of the abdomen and pelvis was performed using the standard protocol following bolus administration of intravenous contrast. CONTRAST:  168mL OMNIPAQUE IOHEXOL 300 MG/ML  SOLN COMPARISON:  08/31/2016 FINDINGS: Lower chest: No acute finding. 22 mm lobulated posterior mediastinal mass left of the lower thoracic spine there remain stable and benign. Hepatobiliary: No focal liver abnormality.Distended gallbladder which contains a stone. No definite wall thickening but there is pericholecystic edema along the gallbladder liver interface. Pancreas:  Unremarkable. Spleen: Unremarkable. Adrenals/Urinary Tract: 15 mm left adrenal nodule consistent with adenoma given stability. No hydronephrosis or stone. Unremarkable bladder. Stomach/Bowel:  No obstruction. No appendicitis. Vascular/Lymphatic: No acute vascular abnormality. Atherosclerosis. No mass or adenopathy. Reproductive:Seminal vesicle calcification often seen with diabetes. Other: No ascites or pneumoperitoneum. Fatty enlargement of the left inguinal canal. Musculoskeletal: Spondylosis.  No acute abnormalities. IMPRESSION: 1. Full gallbladder with stone and mild pericholecystic edema, a biliary cause of pain is still considered despite the ultrasound. 2. Negative appendix. 3. Chronic findings are stable and described above. Electronically Signed   By: Monte Fantasia M.D.   On: 08/07/2018 05:11   US Abdomen Limited  Result Date: 08/07/2018 CLINICAL DATA:  73 year old male with right upper quadrant pain and vomiting. EXAM: ULTRASOUND ABDOMEN LIMITED RIGHT UPPER QUADRANT COMPARISON:  CT Abdomen and Pelvis 08/31/2016. Ultrasound 09/01/2016. FINDINGS: Gallbladder: Small volume dependent sludge. No cholelithiasis. Gallbladder wall thickness within normal limits. No pericholecystic fluid. No sonographic Murphy sign elicited. Common bile duct: Diameter: 4 millimeters, normal. Liver: Echogenic liver (image 29). No discrete liver lesion. No intrahepatic biliary ductal dilatation. Portal vein is patent on color Doppler imaging with normal direction of blood flow towards the liver. Other findings: Negative visible right kidney. IMPRESSION: 1. Mild gallbladder sludge. No gallstones, evidence of cholecystitis, or bile duct obstruction. 2. Chronic fatty liver disease. Electronically Signed   By: Genevie Ann M.D.   On: 08/07/2018 03:22    Procedures Procedures (including critical care time)  Medications Ordered in ED Medications  sodium chloride flush (NS) 0.9 % injection 3 mL (3 mLs Intravenous Given 08/07/18  0242)  HYDROmorphone (DILAUDID) injection 1 mg (1 mg Intravenous Given 08/07/18 0241)  ondansetron (ZOFRAN) injection 4 mg (4 mg Intravenous Given 08/07/18 0241)     Initial Impression / Assessment and Plan / ED Course  I have reviewed the triage vital signs and the nursing notes.  Pertinent labs & imaging results that were available during my care of the patient were reviewed by me and considered in my medical decision making (see chart for details).        Patient with right upper quadrant pain.  Has had history of inflamed gallbladder, but still has gallbladder.  Significantly tender on exam.  Ultrasound shows sludge.  Due to the amount of pain, will check CT.  CT shows full gallbladder with stones and pericholecystic edema.  Patient seen by and discussed with Dr. Randal Buba.  WBC 15.0, T bili 0.7, transaminases are normal, lipase 34.  Discussed with Dr. Georgette Dover, who will see the patient, anticipate cholecystectomy.  Final Clinical Impressions(s) / ED Diagnoses   Final diagnoses:  Gallbladder colic    ED Discharge Orders    None       Montine Circle, PA-C 08/07/18 [place], April, MD 08/07/18 [id number]

## 2018-08-07 NOTE — H&P (Signed)
Reason for Consult: abdominal pain Referring Physician: Montine Circle  Christopher Patel is an 73 y.o. male.  HPI: 73 yo male with long history of epigastric pain. He was evaluated for gallstones 2 years ago and recommended to have gallbladder removed. Since then he has occasional abdominal pains that come randomly. Last night he had a very intense episode and so came to the ER. He has some nausea. He denies diarrhea. Everyone in his family has had their gallbladder removed  Past Medical History:  Diagnosis Date  . CAD (coronary artery disease)   . Change in voice   . Cough   . Depression   . Diabetes mellitus 04-25-11   oral meds  . ED (erectile dysfunction)   . Hepatic lesion    left lobe  . Hyperlipidemia   . Hypertension    dr Marilynn Rail  . Hypothyroidism   . Lung nodule   . Nasal congestion   . Osteoarthritis 04-25-11   spine area-some neck issues  . Paroxysmal atrial fibrillation ([facility])    started on metoprolol and Xarelto 07/02/2015 for afib RVR  . Pneumonia   . PONV (postoperative nausea and vomiting)   . Thyroid disease    thyroid lesion  . Tubular adenoma    polyps- Dr.Edwards    Past Surgical History:  Procedure Laterality Date  . CARDIAC CATHETERIZATION  04-25-11   many yrs ago-very slight blockage 1 vessel (60% RCA by Dr. Dorthy Cooler notes)  . EYE SURGERY  11/2010   cataract x2  . EYE SURGERY  12/2010   len implant x2   . KNEE SURGERY  04-25-11   right/ left knee scope  . THYROIDECTOMY  05/02/2011   Procedure: THYROIDECTOMY;  Surgeon: Earnstine Regal, MD;  Location: [facility];  Service: General;  Laterality: N/A;  Total Thyroidectomy  . TOTAL KNEE ARTHROPLASTY Right 04/25/2013   Procedure: TOTAL KNEE ARTHROPLASTY;  Surgeon: Kerin Salen, MD;  Location: [place];  Service: Orthopedics;  Laterality: Right;    Family History  Problem Relation Age of Onset  . Diabetes Mother   . Diabetes Brother   . Colon cancer Brother   . COPD Other   . Hypertension Other   .  Hyperlipidemia Other   . Heart attack Other   . Diabetes Sister     Social History:  reports that he quit smoking about 8 years ago. He quit after 50.00 years of use. He has never used smokeless tobacco. He reports current alcohol use. He reports that he does not use drugs.  Allergies:  Allergies  Allergen Reactions  . Actos [Pioglitazone Hydrochloride] Nausea Only  . Glyburide Other (See Comments)    hypoglycemia  . Metformin And Related Other (See Comments)    Hypoglycemia, shakes Fast acting metformin  . Codeine Itching    Medications: I have reviewed the patient's current medications.  Results for orders placed or performed during the hospital encounter of 08/07/18 (from the past 48 hour(s))  Lipase, blood     Status: None   Collection Time: 08/07/18  3:29 AM  Result Value Ref Range   Lipase 34 11 - 51 U/L    Comment: Performed at [facility], [address] [place], [place] [address]  Comprehensive metabolic panel     Status: Abnormal   Collection Time: 08/07/18  3:29 AM  Result Value Ref Range   Sodium 135 135 - 145 mmol/L   Potassium 4.1 3.5 - 5.1 mmol/L  Chloride 98 98 - 111 mmol/L   CO2 25 22 - 32 mmol/L   Glucose, Bld 161 (H) 70 - 99 mg/dL   BUN 16 8 - 23 mg/dL   Creatinine, Ser 1.16 0.61 - 1.24 mg/dL   Calcium 8.7 (L) 8.9 - 10.3 mg/dL   Total Protein 6.1 (L) 6.5 - 8.1 g/dL   Albumin 3.7 3.5 - 5.0 g/dL   AST 21 15 - 41 U/L   ALT 29 0 - 44 U/L   Alkaline Phosphatase 32 (L) 38 - 126 U/L   Total Bilirubin 0.7 0.3 - 1.2 mg/dL   GFR calc non Af Amer >60 >60 mL/min   GFR calc Af Amer >60 >60 mL/min   Anion gap 12 5 - 15    Comment: Performed at [facility] [address], [place], [place] [address]  CBC     Status: Abnormal   Collection Time: 08/07/18  3:29 AM  Result Value Ref Range   WBC 15.0 (H) 4.0 - 10.5 K/uL   RBC 4.47 4.22 - 5.81 MIL/uL   Hemoglobin 13.3 13.0 - 17.0 g/dL   HCT 41.0 39.0 - 52.0 %   MCV 91.7 80.0 - 100.0 fL   MCH 29.8  26.0 - 34.0 pg   MCHC 32.4 30.0 - 36.0 g/dL   RDW 13.3 11.5 - 15.5 %   Platelets 237 150 - 400 K/uL   nRBC 0.0 0.0 - 0.2 %    Comment: Performed at [facility], [place] [address], [place], [place] [address]  Urinalysis, Routine w reflex microscopic     Status: Abnormal   Collection Time: 08/07/18  5:01 AM  Result Value Ref Range   Color, Urine YELLOW YELLOW   APPearance CLEAR CLEAR   Specific Gravity, Urine 1.027 1.005 - 1.030   pH 5.0 5.0 - 8.0   Glucose, UA NEGATIVE NEGATIVE mg/dL   Hgb urine dipstick NEGATIVE NEGATIVE   Bilirubin Urine NEGATIVE NEGATIVE   Ketones, ur 5 (A) NEGATIVE mg/dL   Protein, ur NEGATIVE NEGATIVE mg/dL   Nitrite NEGATIVE NEGATIVE   Leukocytes,Ua NEGATIVE NEGATIVE    Comment: Performed at [facility] [address] [place], [place] [address]  SARS Coronavirus 2 (CEPHEID - Performed in [facility] hospital lab), Hosp Order     Status: None   Collection Time: 08/07/18  6:16 AM   Specimen: Nasopharyngeal Swab  Result Value Ref Range   SARS Coronavirus 2 NEGATIVE NEGATIVE    Comment: (NOTE) If result is NEGATIVE SARS-CoV-2 target nucleic acids are NOT DETECTED. The SARS-CoV-2 RNA is generally detectable in upper and lower  respiratory specimens during the acute phase of infection. The lowest  concentration of SARS-CoV-2 viral copies this assay can detect is 250  copies / mL. A negative result does not preclude SARS-CoV-2 infection  and should not be used as the sole basis for treatment or other  patient management decisions.  A negative result may occur with  improper specimen collection / handling, submission of specimen other  than nasopharyngeal swab, presence of viral mutation(s) within the  areas targeted by this assay, and inadequate number of viral copies  (<250 copies / mL). A negative result must be combined with clinical  observations, patient history, and epidemiological information. If result is POSITIVE SARS-CoV-2 target nucleic  acids are DETECTED. The SARS-CoV-2 RNA is generally detectable in upper and lower  respiratory specimens dur ing the acute phase of infection.  Positive  results are  indicative of active infection with SARS-CoV-2.  Clinical  correlation with patient history and other diagnostic information is  necessary to determine patient infection status.  Positive results do  not rule out bacterial infection or co-infection with other viruses. If result is PRESUMPTIVE POSTIVE SARS-CoV-2 nucleic acids MAY BE PRESENT.   A presumptive positive result was obtained on the submitted specimen  and confirmed on repeat testing.  While 2019 novel coronavirus  (SARS-CoV-2) nucleic acids may be present in the submitted sample  additional confirmatory testing may be necessary for epidemiological  and / or clinical management purposes  to differentiate between  SARS-CoV-2 and other Sarbecovirus currently known to infect humans.  If clinically indicated additional testing with an alternate test  methodology [phone]) is advised. The SARS-CoV-2 RNA is generally  detectable in upper and lower respiratory sp ecimens during the acute  phase of infection. The expected result is Negative. Fact Sheet for Patients:  StrictlyIdeas.no Fact Sheet for Healthcare Providers: BankingDealers.co.za This test is not yet approved or cleared by the [place] FDA and has been authorized for detection and/or diagnosis of SARS-CoV-2 by FDA under an Emergency Use Authorization (EUA).  This EUA will remain in effect (meaning this test can be used) for the duration of the COVID-19 declaration under Section 564(b)(1) of the Act, 21 U.S.C. section 360bbb-3(b)(1), unless the authorization is terminated or revoked sooner. Performed at [facility], [place] [address], [place], [place] [address]     Ct Abdomen Pelvis W Contrast  Result Date: 08/07/2018 CLINICAL DATA:  Right lower  quadrant pain radiating to the back EXAM: CT ABDOMEN AND PELVIS WITH CONTRAST TECHNIQUE: Multidetector CT imaging of the abdomen and pelvis was performed using the standard protocol following bolus administration of intravenous contrast. CONTRAST:  169mL OMNIPAQUE IOHEXOL 300 MG/ML  SOLN COMPARISON:  08/31/2016 FINDINGS: Lower chest: No acute finding. 22 mm lobulated posterior mediastinal mass left of the lower thoracic spine there remain stable and benign. Hepatobiliary: No focal liver abnormality.Distended gallbladder which contains a stone. No definite wall thickening but there is pericholecystic edema along the gallbladder liver interface. Pancreas: Unremarkable. Spleen: Unremarkable. Adrenals/Urinary Tract: 15 mm left adrenal nodule consistent with adenoma given stability. No hydronephrosis or stone. Unremarkable bladder. Stomach/Bowel:  No obstruction. No appendicitis. Vascular/Lymphatic: No acute vascular abnormality. Atherosclerosis. No mass or adenopathy. Reproductive:Seminal vesicle calcification often seen with diabetes. Other: No ascites or pneumoperitoneum. Fatty enlargement of the left inguinal canal. Musculoskeletal: Spondylosis.  No acute abnormalities. IMPRESSION: 1. Full gallbladder with stone and mild pericholecystic edema, a biliary cause of pain is still considered despite the ultrasound. 2. Negative appendix. 3. Chronic findings are stable and described above. Electronically Signed   By: Monte Fantasia M.D.   On: 08/07/2018 05:11   US Abdomen Limited  Result Date: 08/07/2018 CLINICAL DATA:  73 year old male with right upper quadrant pain and vomiting. EXAM: ULTRASOUND ABDOMEN LIMITED RIGHT UPPER QUADRANT COMPARISON:  CT Abdomen and Pelvis 08/31/2016. Ultrasound 09/01/2016. FINDINGS: Gallbladder: Small volume dependent sludge. No cholelithiasis. Gallbladder wall thickness within normal limits. No pericholecystic fluid. No sonographic Murphy sign elicited. Common bile duct: Diameter: 4  millimeters, normal. Liver: Echogenic liver (image 29). No discrete liver lesion. No intrahepatic biliary ductal dilatation. Portal vein is patent on color Doppler imaging with normal direction of blood flow towards the liver. Other findings: Negative visible right kidney. IMPRESSION: 1. Mild gallbladder sludge. No gallstones, evidence of cholecystitis, or bile duct obstruction. 2. Chronic fatty liver disease. Electronically Signed   By: Lemmie Evens  [place] Crane M.D.   On: 08/07/2018 03:22    Review of Systems  Constitutional: Negative for chills and fever.  HENT: Negative for hearing loss.   Eyes: Negative for blurred vision and double vision.  Respiratory: Negative for cough and hemoptysis.   Cardiovascular: Negative for chest pain and palpitations.  Gastrointestinal: Positive for abdominal pain and nausea. Negative for vomiting.  Genitourinary: Negative for dysuria and urgency.  Musculoskeletal: Negative for myalgias and neck pain.  Skin: Negative for itching and rash.  Neurological: Negative for dizziness, tingling and headaches.  Endo/Heme/Allergies: Does not bruise/bleed easily.  Psychiatric/Behavioral: Negative for depression and suicidal ideas.   Blood pressure 140/65, pulse (!) 56, temperature (!) 97.3 F (36.3 C), temperature source Oral, resp. rate (!) 21, height 5\' 9"  (1.753 m), weight 99.8 kg, SpO2 95 %. Physical Exam  Vitals reviewed. Constitutional: He is oriented to person, place, and time. He appears well-developed and well-nourished.  HENT:  Head: Normocephalic and atraumatic.  Eyes: Pupils are equal, round, and reactive to light. Conjunctivae and EOM are normal.  Neck: Normal range of motion. Neck supple.  Cardiovascular: Normal rate and regular rhythm.  Respiratory: Effort normal and breath sounds normal.  GI: Soft. Bowel sounds are normal. He exhibits no distension. There is abdominal tenderness in the right upper quadrant. There is no guarding.  Musculoskeletal: Normal range of  motion.  Neurological: He is alert and oriented to person, place, and time.  Skin: Skin is warm and dry.  Psychiatric: He has a normal mood and affect. His behavior is normal.      Assessment/Plan: 73 yo male with epigastric pain concerning acute cholecystitis -lap chole -IV abx We discussed the etiology of her pain, we discussed treatment options and recommended surgery. We discussed details of surgery including general anesthesia, laparoscopic approach, identification of cystic duct and common bile duct. Ligation of cystic duct and cystic artery. Possible need for intraoperative cholangiogram or open procedure. Possible risks of common bile duct injury, liver injury, cystic duct leak, bleeding, infection, post-cholecystectomy syndrome. The patient showed good understanding and all questions were answered   Arta Bruce Teagyn Fishel 08/07/2018, 8:22 AM

## 2018-08-07 NOTE — ED Notes (Signed)
Patient transported to CT 

## 2018-08-07 NOTE — Progress Notes (Signed)
Pt very agitated, combatative, confused upon adm to PACU. Med with additional IV Versed and the Precedex by CRNA who has updated Dr Fransisco Beau. MDA told her he'd be by to check pt. Pt calmer after Precedex. Med with IV Fentanyl for postop pain with good effect.

## 2018-08-07 NOTE — Plan of Care (Signed)
  Problem: Clinical Measurements: Goal: Ability to maintain clinical measurements within normal limits will improve Outcome: Progressing Goal: Postoperative complications will be avoided or minimized Outcome: Progressing   Problem: Skin Integrity: Goal: Demonstration of wound healing without infection will improve Outcome: Progressing   

## 2018-08-07 NOTE — Anesthesia Preprocedure Evaluation (Addendum)
Anesthesia Evaluation  Patient identified by MRN, date of birth, ID band Patient awake    Reviewed: Allergy & Precautions, NPO status , Patient's Chart, lab work & pertinent test results  History of Anesthesia Complications (+) PONV and history of anesthetic complications  Airway Mallampati: III  TM Distance: >3 FB Neck ROM: Full    Dental  (+) Dental Advisory Given, Chipped,    Pulmonary former smoker,    breath sounds clear to auscultation       Cardiovascular hypertension, Pt. on medications and Pt. on home beta blockers + CAD   Rhythm:Regular Rate:Normal   '17 TTE - Mild concentric LVH. EF 55% to 60%. Grade 1 diastolic dysfunction. LA was mildly dilated. A trivial pericardial effusion was identified posterior to the heart. There was no evidence of hemodynamic compromise.   Neuro/Psych PSYCHIATRIC DISORDERS Depression negative neurological ROS     GI/Hepatic Neg liver ROS, GERD  Controlled and Medicated,  Endo/Other  diabetes, Type 2, Oral Hypoglycemic Agents, Insulin DependentHypothyroidism  Obesity   Renal/GU negative Renal ROS     Musculoskeletal  (+) Arthritis ,   Abdominal   Peds  Hematology negative hematology ROS (+)   Anesthesia Other Findings   Reproductive/Obstetrics                           Anesthesia Physical Anesthesia Plan  ASA: III  Anesthesia Plan: General   Post-op Pain Management:    Induction: Intravenous  PONV Risk Score and Plan: 4 or greater and Treatment may vary due to age or medical condition, Ondansetron, Dexamethasone and Propofol infusion  Airway Management Planned: Oral ETT  Additional Equipment: None  Intra-op Plan:   Post-operative Plan: Extubation in OR  Informed Consent: I have reviewed the patients History and Physical, chart, labs and discussed the procedure including the risks, benefits and alternatives for the proposed anesthesia with  the patient or authorized representative who has indicated his/her understanding and acceptance.     Dental advisory given  Plan Discussed with: CRNA and Anesthesiologist  Anesthesia Plan Comments:        Anesthesia Quick Evaluation

## 2018-08-07 NOTE — Anesthesia Procedure Notes (Signed)
Procedure Name: Intubation Date/Time: 08/07/2018 10:20 AM Performed by: Leonor Liv, CRNA Pre-anesthesia Checklist: Patient identified, Emergency Drugs available, Suction available and Patient being monitored Patient Re-evaluated:Patient Re-evaluated prior to induction Oxygen Delivery Method: Circle System Utilized Preoxygenation: Pre-oxygenation with 100% oxygen Induction Type: IV induction Ventilation: Mask ventilation without difficulty and Oral airway inserted - appropriate to patient size Laryngoscope Size: Mac and 4 Grade View: Grade I Tube type: Oral Tube size: 7.5 mm Number of attempts: 1 Airway Equipment and Method: Stylet and Oral airway Placement Confirmation: ETT inserted through vocal cords under direct vision,  positive ETCO2 and breath sounds checked- equal and bilateral Secured at: 23 cm Tube secured with: Tape Dental Injury: Teeth and Oropharynx as per pre-operative assessment

## 2018-08-07 NOTE — ED Notes (Signed)
ED Provider at bedside. 

## 2018-08-07 NOTE — Care Management Obs Status (Signed)
[place] NOTIFICATION   Patient Details  Name: Christopher Patel MRN: [id number] Date of Birth: 08-17-1945   Medicare Observation Status Notification Given:  Yes    Marilu Favre, RN 08/07/2018, 3:27 PM

## 2018-08-08 ENCOUNTER — Encounter (HOSPITAL_COMMUNITY): Payer: Self-pay | Admitting: General Surgery

## 2018-08-08 ENCOUNTER — Observation Stay (HOSPITAL_COMMUNITY): Payer: Medicare HMO

## 2018-08-08 DIAGNOSIS — E890 Postprocedural hypothyroidism: Secondary | ICD-10-CM | POA: Diagnosis present

## 2018-08-08 DIAGNOSIS — Z86010 Personal history of colonic polyps: Secondary | ICD-10-CM | POA: Diagnosis not present

## 2018-08-08 DIAGNOSIS — Z79899 Other long term (current) drug therapy: Secondary | ICD-10-CM | POA: Diagnosis not present

## 2018-08-08 DIAGNOSIS — J9602 Acute respiratory failure with hypercapnia: Secondary | ICD-10-CM | POA: Diagnosis not present

## 2018-08-08 DIAGNOSIS — R1013 Epigastric pain: Secondary | ICD-10-CM | POA: Diagnosis present

## 2018-08-08 DIAGNOSIS — Z800 Family history of malignant neoplasm of digestive organs: Secondary | ICD-10-CM | POA: Diagnosis not present

## 2018-08-08 DIAGNOSIS — Z20828 Contact with and (suspected) exposure to other viral communicable diseases: Secondary | ICD-10-CM | POA: Diagnosis present

## 2018-08-08 DIAGNOSIS — Z87891 Personal history of nicotine dependence: Secondary | ICD-10-CM | POA: Diagnosis not present

## 2018-08-08 DIAGNOSIS — K219 Gastro-esophageal reflux disease without esophagitis: Secondary | ICD-10-CM | POA: Diagnosis present

## 2018-08-08 DIAGNOSIS — J449 Chronic obstructive pulmonary disease, unspecified: Secondary | ICD-10-CM | POA: Diagnosis present

## 2018-08-08 DIAGNOSIS — Z7984 Long term (current) use of oral hypoglycemic drugs: Secondary | ICD-10-CM | POA: Diagnosis not present

## 2018-08-08 DIAGNOSIS — R0602 Shortness of breath: Secondary | ICD-10-CM | POA: Diagnosis not present

## 2018-08-08 DIAGNOSIS — K8020 Calculus of gallbladder without cholecystitis without obstruction: Secondary | ICD-10-CM | POA: Diagnosis not present

## 2018-08-08 DIAGNOSIS — I2510 Atherosclerotic heart disease of native coronary artery without angina pectoris: Secondary | ICD-10-CM | POA: Diagnosis present

## 2018-08-08 DIAGNOSIS — I10 Essential (primary) hypertension: Secondary | ICD-10-CM | POA: Diagnosis present

## 2018-08-08 DIAGNOSIS — I480 Paroxysmal atrial fibrillation: Secondary | ICD-10-CM | POA: Diagnosis present

## 2018-08-08 DIAGNOSIS — Z7901 Long term (current) use of anticoagulants: Secondary | ICD-10-CM | POA: Diagnosis not present

## 2018-08-08 DIAGNOSIS — K810 Acute cholecystitis: Secondary | ICD-10-CM

## 2018-08-08 DIAGNOSIS — Z885 Allergy status to narcotic agent status: Secondary | ICD-10-CM | POA: Diagnosis not present

## 2018-08-08 DIAGNOSIS — K8000 Calculus of gallbladder with acute cholecystitis without obstruction: Secondary | ICD-10-CM | POA: Diagnosis present

## 2018-08-08 DIAGNOSIS — Z888 Allergy status to other drugs, medicaments and biological substances status: Secondary | ICD-10-CM | POA: Diagnosis not present

## 2018-08-08 DIAGNOSIS — Z825 Family history of asthma and other chronic lower respiratory diseases: Secondary | ICD-10-CM | POA: Diagnosis not present

## 2018-08-08 DIAGNOSIS — R0902 Hypoxemia: Secondary | ICD-10-CM | POA: Diagnosis not present

## 2018-08-08 DIAGNOSIS — J309 Allergic rhinitis, unspecified: Secondary | ICD-10-CM | POA: Diagnosis present

## 2018-08-08 DIAGNOSIS — J9601 Acute respiratory failure with hypoxia: Secondary | ICD-10-CM

## 2018-08-08 DIAGNOSIS — Z79890 Hormone replacement therapy (postmenopausal): Secondary | ICD-10-CM | POA: Diagnosis not present

## 2018-08-08 DIAGNOSIS — Z7982 Long term (current) use of aspirin: Secondary | ICD-10-CM | POA: Diagnosis not present

## 2018-08-08 DIAGNOSIS — Z9981 Dependence on supplemental oxygen: Secondary | ICD-10-CM | POA: Diagnosis not present

## 2018-08-08 DIAGNOSIS — E119 Type 2 diabetes mellitus without complications: Secondary | ICD-10-CM | POA: Diagnosis present

## 2018-08-08 DIAGNOSIS — Z8379 Family history of other diseases of the digestive system: Secondary | ICD-10-CM | POA: Diagnosis not present

## 2018-08-08 LAB — COMPREHENSIVE METABOLIC PANEL
ALT: 80 U/L — ABNORMAL HIGH (ref 0–44)
AST: 61 U/L — ABNORMAL HIGH (ref 15–41)
Albumin: 2.9 g/dL — ABNORMAL LOW (ref 3.5–5.0)
Alkaline Phosphatase: 30 U/L — ABNORMAL LOW (ref 38–126)
Anion gap: 9 (ref 5–15)
BUN: 10 mg/dL (ref 8–23)
CO2: 29 mmol/L (ref 22–32)
Calcium: 8.1 mg/dL — ABNORMAL LOW (ref 8.9–10.3)
Chloride: 97 mmol/L — ABNORMAL LOW (ref 98–111)
Creatinine, Ser: 1.07 mg/dL (ref 0.61–1.24)
GFR calc Af Amer: >60 mL/min (ref >60)
GFR calc non Af Amer: >60 mL/min (ref >60)
Glucose, Bld: 161 mg/dL — ABNORMAL HIGH (ref 70–99)
Potassium: 3.7 mmol/L (ref 3.5–5.1)
Sodium: 135 mmol/L (ref 135–145)
Total Bilirubin: 1.1 mg/dL (ref 0.3–1.2)
Total Protein: 5.3 g/dL — ABNORMAL LOW (ref 6.5–8.1)

## 2018-08-08 LAB — CBC
HCT: 38.1 % — ABNORMAL LOW (ref 39.0–52.0)
Hemoglobin: 12.5 g/dL — ABNORMAL LOW (ref 13.0–17.0)
MCH: 30.1 pg (ref 26.0–34.0)
MCHC: 32.8 g/dL (ref 30.0–36.0)
MCV: 91.8 fL (ref 80.0–100.0)
Platelets: 166 10*3/uL (ref 150–400)
RBC: 4.15 MIL/uL — ABNORMAL LOW (ref 4.22–5.81)
RDW: 13.9 % (ref 11.5–15.5)
WBC: 14.2 10*3/uL — ABNORMAL HIGH (ref 4.0–10.5)
nRBC: 0 % (ref 0.0–0.2)

## 2018-08-08 LAB — GLUCOSE, CAPILLARY
Glucose-Capillary: 138 mg/dL — ABNORMAL HIGH (ref 70–99)
Glucose-Capillary: 148 mg/dL — ABNORMAL HIGH (ref 70–99)
Glucose-Capillary: 151 mg/dL — ABNORMAL HIGH (ref 70–99)
Glucose-Capillary: 170 mg/dL — ABNORMAL HIGH (ref 70–99)
Glucose-Capillary: 173 mg/dL — ABNORMAL HIGH (ref 70–99)
Glucose-Capillary: 178 mg/dL — ABNORMAL HIGH (ref 70–99)

## 2018-08-08 MED ORDER — METOPROLOL TARTRATE 25 MG PO TABS
25.0000 mg | ORAL_TABLET | Freq: Two times a day (BID) | ORAL | Status: DC
  Administered 2018-08-08 – 2018-08-09 (×3): 25 mg via ORAL
  Filled 2018-08-08 (×3): qty 1

## 2018-08-08 MED ORDER — LEVOTHYROXINE SODIUM 75 MCG PO TABS
150.0000 ug | ORAL_TABLET | Freq: Every day | ORAL | Status: DC
  Administered 2018-08-08 – 2018-08-09 (×2): 150 ug via ORAL
  Filled 2018-08-08 (×2): qty 2

## 2018-08-08 MED ORDER — LORATADINE 10 MG PO TABS
10.0000 mg | ORAL_TABLET | Freq: Every day | ORAL | Status: DC
  Administered 2018-08-08 – 2018-08-09 (×2): 10 mg via ORAL
  Filled 2018-08-08 (×2): qty 1

## 2018-08-08 MED ORDER — ALBUTEROL SULFATE (2.5 MG/3ML) 0.083% IN NEBU
2.5000 mg | INHALATION_SOLUTION | Freq: Two times a day (BID) | RESPIRATORY_TRACT | Status: DC
  Administered 2018-08-08 (×2): 2.5 mg via RESPIRATORY_TRACT
  Filled 2018-08-08 (×3): qty 3

## 2018-08-08 MED ORDER — ASPIRIN EC 81 MG PO TBEC
81.0000 mg | DELAYED_RELEASE_TABLET | Freq: Every day | ORAL | Status: DC
  Administered 2018-08-08 – 2018-08-09 (×2): 81 mg via ORAL
  Filled 2018-08-08 (×2): qty 1

## 2018-08-08 MED ORDER — AMLODIPINE BESYLATE 10 MG PO TABS
10.0000 mg | ORAL_TABLET | Freq: Every day | ORAL | Status: DC
  Administered 2018-08-08 – 2018-08-09 (×2): 10 mg via ORAL
  Filled 2018-08-08 (×2): qty 1

## 2018-08-08 MED ORDER — ENOXAPARIN SODIUM 40 MG/0.4ML ~~LOC~~ SOLN
40.0000 mg | SUBCUTANEOUS | Status: DC
  Administered 2018-08-08: 40 mg via SUBCUTANEOUS
  Filled 2018-08-08: qty 0.4

## 2018-08-08 MED ORDER — HYDROCHLOROTHIAZIDE 25 MG PO TABS
50.0000 mg | ORAL_TABLET | Freq: Every day | ORAL | Status: DC
  Administered 2018-08-08 – 2018-08-09 (×2): 50 mg via ORAL
  Filled 2018-08-08 (×2): qty 2

## 2018-08-08 MED ORDER — PANTOPRAZOLE SODIUM 40 MG PO TBEC
40.0000 mg | DELAYED_RELEASE_TABLET | Freq: Every day | ORAL | Status: DC
  Administered 2018-08-08 – 2018-08-09 (×2): 40 mg via ORAL
  Filled 2018-08-08 (×2): qty 1

## 2018-08-08 MED ORDER — IOHEXOL 350 MG/ML SOLN
100.0000 mL | Freq: Once | INTRAVENOUS | Status: AC | PRN
  Administered 2018-08-08: 100 mL via INTRAVENOUS

## 2018-08-08 NOTE — Plan of Care (Signed)
  Problem: Clinical Measurements: Goal: Ability to maintain clinical measurements within normal limits will improve Outcome: Progressing Goal: Postoperative complications will be avoided or minimized Outcome: Progressing   Problem: Skin Integrity: Goal: Demonstration of wound healing without infection will improve Outcome: Progressing   

## 2018-08-08 NOTE — Progress Notes (Signed)
Patient ambulated in the hallway with pulse oximeter in place. While ambulation, O2sats went 70 to low 59 Room Air. Assisted Pt back to bed and hooked back to O2 cannula at 2 Liters, O2sats increase to 84-88. Will continue to monitor.

## 2018-08-08 NOTE — Progress Notes (Addendum)
[facility]/Trauma Progress Note  1 Day Post-Op   Assessment/Plan  A fib - 2017, not on anticoagulation  HTN - home meds Hypothyroidism - home synthroid  HLD DM - SSI CAD  Previous smoker - quit 9 yrs ago, 60yr hx   Cholecystitis - S/P lap chole, Dr. Kieth Brightly, 07/15 - okay for discharge from a surgical standpoint  Hypoxia - O2 sats on RA in low 80's.  - no home O2 requirement, pt states he checks it at home and it is around 90 - CXR and EKG pending - will ask medicine to assist   FEN: reg diet, reordered home PO meds VTE: SCD's, lovenox ID: [email or web address] 07/15>> Foley: none Follow up: [facility] 2 weeks  DISPO: medicine consult to assist with hypoxia, DC based on O2 requirements    LOS: 0 days    Subjective: CC: abdominal soreness  No issues overnight. Pt tolerated breakfast. He denies nausea, vomiting, fever or chills. He is urinating without issues and having flatus. No BM. He quit smoking 9 years ago when his wife died of COPD. He states he checks his O2 at home and he is usually around 90-91. He wants to go home.   Objective: Vital signs in last 24 hours: Temp:  [97 F (36.1 C)-99.9 F (37.7 C)] 98 F (36.7 C) (07/16 0408) Pulse Rate:  [87-107] 90 (07/16 0408) Resp:  [18-27] 18 (07/16 0408) BP: (107-157)/(48-94) 127/77 (07/16 0408) SpO2:  [82 %-96 %] 96 % (07/16 0408) Last BM Date: (PTA)  Intake/Output from previous day: 07/15 0701 - 07/16 0700 In: 3185.9 [P.O.:330; I.V.:2855.9] Out: 1280 [Urine:1250; Blood:30] Intake/Output this shift: No intake/output data recorded.  PE: Gen:  Alert, NAD, pleasant, cooperative Card:  RRR, no M/G/R heard Pulm:  CTA, no W/R/R, rate and effort normal, sats around 81-83 on RA Abd: Soft, ND, +BS, no HSM, incisions with glue intact appear well and are without bleeding or drainage. Mild TTP without guarding, no peritonitis  Skin: no rashes noted, warm and dry   Anti-infectives: Anti-infectives (From admission,  onward)   Start     Dose/Rate Route Frequency Ordered Stop   08/07/18 0900  cefTRIAXone (ROCEPHIN) 2 g in sodium chloride 0.9 % 100 mL IVPB     2 g 200 mL/hr over 30 Minutes Intravenous Every 24 hours 08/07/18 0820        Lab Results:  Recent Labs    08/07/18 0329 08/08/18 0337  WBC 15.0* 14.2*  HGB 13.3 12.5*  HCT 41.0 38.1*  PLT 237 166   BMET Recent Labs    08/07/18 0329 08/08/18 0337  NA 135 135  K 4.1 3.7  CL 98 97*  CO2 25 29  GLUCOSE 161* 161*  BUN 16 10  CREATININE 1.16 1.07  CALCIUM 8.7* 8.1*   PT/INR No results for input(s): LABPROT, INR in the last 72 hours. CMP     Component Value Date/Time   NA 135 08/08/2018 0337   K 3.7 08/08/2018 0337   CL 97 (L) 08/08/2018 0337   CO2 29 08/08/2018 0337   GLUCOSE 161 (H) 08/08/2018 0337   BUN 10 08/08/2018 0337   CREATININE 1.07 08/08/2018 0337   CALCIUM 8.1 (L) 08/08/2018 0337   PROT 5.3 (L) 08/08/2018 0337   ALBUMIN 2.9 (L) 08/08/2018 0337   AST 61 (H) 08/08/2018 0337   ALT 80 (H) 08/08/2018 0337   ALKPHOS 30 (L) 08/08/2018 0337   BILITOT 1.1 08/08/2018 0337   GFRNONAA >60 08/08/2018 7425  GFRAA >60 08/08/2018 0337   Lipase     Component Value Date/Time   LIPASE 34 08/07/2018 0329    Studies/Results: Ct Abdomen Pelvis W Contrast  Result Date: 08/07/2018 CLINICAL DATA:  Right lower quadrant pain radiating to the back EXAM: CT ABDOMEN AND PELVIS WITH CONTRAST TECHNIQUE: Multidetector CT imaging of the abdomen and pelvis was performed using the standard protocol following bolus administration of intravenous contrast. CONTRAST:  122mL OMNIPAQUE IOHEXOL 300 MG/ML  SOLN COMPARISON:  08/31/2016 FINDINGS: Lower chest: No acute finding. 22 mm lobulated posterior mediastinal mass left of the lower thoracic spine there remain stable and benign. Hepatobiliary: No focal liver abnormality.Distended gallbladder which contains a stone. No definite wall thickening but there is pericholecystic edema along the  gallbladder liver interface. Pancreas: Unremarkable. Spleen: Unremarkable. Adrenals/Urinary Tract: 15 mm left adrenal nodule consistent with adenoma given stability. No hydronephrosis or stone. Unremarkable bladder. Stomach/Bowel:  No obstruction. No appendicitis. Vascular/Lymphatic: No acute vascular abnormality. Atherosclerosis. No mass or adenopathy. Reproductive:Seminal vesicle calcification often seen with diabetes. Other: No ascites or pneumoperitoneum. Fatty enlargement of the left inguinal canal. Musculoskeletal: Spondylosis.  No acute abnormalities. IMPRESSION: 1. Full gallbladder with stone and mild pericholecystic edema, a biliary cause of pain is still considered despite the ultrasound. 2. Negative appendix. 3. Chronic findings are stable and described above. Electronically Signed   By: Monte Fantasia M.D.   On: 08/07/2018 05:11   US Abdomen Limited  Result Date: 08/07/2018 CLINICAL DATA:  73 year old male with right upper quadrant pain and vomiting. EXAM: ULTRASOUND ABDOMEN LIMITED RIGHT UPPER QUADRANT COMPARISON:  CT Abdomen and Pelvis 08/31/2016. Ultrasound 09/01/2016. FINDINGS: Gallbladder: Small volume dependent sludge. No cholelithiasis. Gallbladder wall thickness within normal limits. No pericholecystic fluid. No sonographic Murphy sign elicited. Common bile duct: Diameter: 4 millimeters, normal. Liver: Echogenic liver (image 29). No discrete liver lesion. No intrahepatic biliary ductal dilatation. Portal vein is patent on color Doppler imaging with normal direction of blood flow towards the liver. Other findings: Negative visible right kidney. IMPRESSION: 1. Mild gallbladder sludge. No gallstones, evidence of cholecystitis, or bile duct obstruction. 2. Chronic fatty liver disease. Electronically Signed   By: Genevie Ann M.D.   On: 08/07/2018 03:22      Kalman Drape , [facility] 08/08/2018, 9:43 AM  Pager: [phone] Mon-Wed, Friday 7:00am-4:30pm Thurs  7am-11:30am  Consults: [phone]

## 2018-08-08 NOTE — Anesthesia Postprocedure Evaluation (Signed)
Anesthesia Post Note  Patient: Michaell Grider  Procedure(s) Performed: LAPAROSCOPIC CHOLECYSTECTOMY (N/A Abdomen)     Patient location during evaluation: PACU Anesthesia Type: General Level of consciousness: awake and alert Pain management: pain level controlled Vital Signs Assessment: post-procedure vital signs reviewed and stable Respiratory status: spontaneous breathing, nonlabored ventilation, respiratory function stable and patient connected to nasal cannula oxygen Cardiovascular status: blood pressure returned to baseline and stable Postop Assessment: no apparent nausea or vomiting Anesthetic complications: no    Last Vitals:  Vitals:   08/08/18 1022 08/08/18 1425  BP:  (!) 156/74  Pulse:  (!) 106  Resp:  14  Temp:  37.5 C  SpO2: 92% 91%                 Audry Pili

## 2018-08-08 NOTE — Consult Note (Addendum)
Medical Consultation   Vikash Nest  WYO:[id number]  DOB: 1945/08/19  DOA: 08/07/2018  PCP: Lujean Amel, MD     Requesting physician: Florence Canner, MD  Reason for consultation: Hypoxia   History of Present Illness: Christopher Patel is an 73 y.o. male with past medical history significant for hypertension, hyperlipidemia, paroxysmal atrial fibrillation on Eliquis, insulin-dependent diabetes, remote tobacco abuse, cholelithiasis, and hypothyroidism; who presented with complaints of epigastric pain.  Patient underwent laparoscopic cholecystectomy yesterday treated with antibiotics.  Patient's O2 saturations overnight noted to be as low as 80% on room air.  Patient not on home O2 at baseline and denies any use of inhalers. Previous reports having a 50 smoking pack year history prior to quitting almost 9 years ago.  Following surgery patient reports pain near surgical sites which he has not been taking deep breaths.  Review of Systems:  Review of Systems  Respiratory: Positive for shortness of breath. Negative for cough.   Cardiovascular: Negative for chest pain.  Gastrointestinal: Positive for abdominal pain.   As per HPI otherwise 10 point review of systems negative.     Past Medical History: Past Medical History:  Diagnosis Date   CAD (coronary artery disease)    Change in voice    Cough    Depression    Diabetes mellitus 04-25-11   oral meds   ED (erectile dysfunction)    Hepatic lesion    left lobe   Hyperlipidemia    Hypertension    dr Marilynn Rail   Hypothyroidism    Lung nodule    Nasal congestion    Osteoarthritis 04-25-11   spine area-some neck issues   Paroxysmal atrial fibrillation ([place])    started on metoprolol and Xarelto 07/02/2015 for afib RVR   Pneumonia    PONV (postoperative nausea and vomiting)    Thyroid disease    thyroid lesion   Tubular adenoma    polyps- Dr.Edwards    Past Surgical History: Past  Surgical History:  Procedure Laterality Date   CARDIAC CATHETERIZATION  04-25-11   many yrs ago-very slight blockage 1 vessel (60% RCA by Dr. Dorthy Cooler notes)   CHOLECYSTECTOMY N/A 08/07/2018   Procedure: LAPAROSCOPIC CHOLECYSTECTOMY;  Surgeon: Mickeal Skinner, MD;  Location: [facility];  Service: General;  Laterality: N/A;   EYE SURGERY  11/2010   cataract x2   EYE SURGERY  12/2010   len implant x2    KNEE SURGERY  04-25-11   right/ left knee scope   THYROIDECTOMY  05/02/2011   Procedure: THYROIDECTOMY;  Surgeon: Earnstine Regal, MD;  Location: [facility];  Service: General;  Laterality: N/A;  Total Thyroidectomy   TOTAL KNEE ARTHROPLASTY Right 04/25/2013   Procedure: TOTAL KNEE ARTHROPLASTY;  Surgeon: Kerin Salen, MD;  Location: [facility];  Service: Orthopedics;  Laterality: Right;     Allergies:   Allergies  Allergen Reactions   Actos [Pioglitazone Hydrochloride] Nausea Only   Glyburide Other (See Comments)    hypoglycemia   Metformin And Related Other (See Comments)    Hypoglycemia, shakes Fast acting metformin   Codeine Itching     Social History:  reports that he quit smoking about 8 years ago. He quit after 50.00 years of use. He has never used smokeless tobacco. He reports current alcohol use. He reports that he does not use drugs.   Family History: Family History  Problem Relation  Age of Onset   Diabetes Mother    Diabetes Brother    Colon cancer Brother    COPD Other    Hypertension Other    Hyperlipidemia Other    Heart attack Other    Diabetes Sister      Physical Exam: Vitals:   08/08/18 0000 08/08/18 0408 08/08/18 1006 08/08/18 1022  BP: 136/78 127/77    Pulse: 94 90    Resp: 18 18    Temp: 98.3 F (36.8 C) 98 F (36.7 C)    TempSrc: Oral Oral    SpO2: 95% 96% (!) 82% 92%  Weight:      Height:        Constitutional: Elderly male who appears to be in no acute distress at this time. Eyes: PERLA, EOMI, irises appear normal, anicteric  sclera,  ENMT: external ears and nose appear normal. Lips appears normal, oropharynx mucosa, tongue, posterior pharynx appear normal  Neck: neck appears normal, no JVD  CVS: Mildly tachycardic no significant lower extremity swelling.  No murmurs appreciated. Respiratory: Decreased overall aeration with patient seen to be splinting.  Currently on 2 L nasal cannula oxygen with O2 saturations in the 90s. Abdomen: Abdomen mildly distended.  Tenderness around surgical wound.  Bowel sounds present. Musculoskeletal: : no cyanosis, clubbing or edema noted bilaterally Neuro: Cranial nerves II-XII intact, strength, sensation, reflexes Psych: judgement and insight appear normal, stable mood and affect, mental status Skin: no rashes or lesions or ulcers, no induration or nodules   Data reviewed:  I have personally reviewed following labs and imaging studies Labs:  CBC: Recent Labs  Lab 08/07/18 0329 08/08/18 0337  WBC 15.0* 14.2*  HGB 13.3 12.5*  HCT 41.0 38.1*  MCV 91.7 91.8  PLT 237 540    Basic Metabolic Panel: Recent Labs  Lab 08/07/18 0329 08/08/18 0337  NA 135 135  K 4.1 3.7  CL 98 97*  CO2 25 29  GLUCOSE 161* 161*  BUN 16 10  CREATININE 1.16 1.07  CALCIUM 8.7* 8.1*   GFR Estimated Creatinine Clearance: 73.7 mL/min (by C-G formula based on SCr of 1.07 mg/dL). Liver Function Tests: Recent Labs  Lab 08/07/18 0329 08/08/18 0337  AST 21 61*  ALT 29 80*  ALKPHOS 32* 30*  BILITOT 0.7 1.1  PROT 6.1* 5.3*  ALBUMIN 3.7 2.9*   Recent Labs  Lab 08/07/18 0329  LIPASE 34   No results for input(s): AMMONIA in the last 168 hours. Coagulation profile No results for input(s): INR, PROTIME in the last 168 hours.  Cardiac Enzymes: No results for input(s): CKTOTAL, CKMB, CKMBINDEX, TROPONINI in the last 168 hours. BNP: Invalid input(s): POCBNP CBG: Recent Labs  Lab 08/07/18 1603 08/07/18 2020 08/07/18 2357 08/08/18 0406 08/08/18 0746  GLUCAP 195* 164* 156* 151* 148*    D-Dimer No results for input(s): DDIMER in the last 72 hours. Hgb A1c No results for input(s): HGBA1C in the last 72 hours. Lipid Profile No results for input(s): CHOL, HDL, LDLCALC, TRIG, CHOLHDL, LDLDIRECT in the last 72 hours. Thyroid function studies No results for input(s): TSH, T4TOTAL, T3FREE, THYROIDAB in the last 72 hours.  Invalid input(s): FREET3 Anemia work up No results for input(s): VITAMINB12, FOLATE, FERRITIN, TIBC, IRON, RETICCTPCT in the last 72 hours. Urinalysis    Component Value Date/Time   COLORURINE YELLOW 08/07/2018 0501   APPEARANCEUR CLEAR 08/07/2018 0501   LABSPEC 1.027 08/07/2018 0501   PHURINE 5.0 08/07/2018 0501   GLUCOSEU NEGATIVE 08/07/2018 0501   HGBUR  NEGATIVE 08/07/2018 0501   BILIRUBINUR NEGATIVE 08/07/2018 0501   KETONESUR 5 (A) 08/07/2018 0501   PROTEINUR NEGATIVE 08/07/2018 0501   UROBILINOGEN 0.2 04/26/2013 2235   NITRITE NEGATIVE 08/07/2018 0501   LEUKOCYTESUR NEGATIVE 08/07/2018 0501     Microbiology Recent Results (from the past 240 hour(s))  SARS Coronavirus 2 (CEPHEID - Performed in [facility] hospital lab), Hosp Order     Status: None   Collection Time: 08/07/18  6:16 AM   Specimen: Nasopharyngeal Swab  Result Value Ref Range Status   SARS Coronavirus 2 NEGATIVE NEGATIVE Final    Comment: (NOTE) If result is NEGATIVE SARS-CoV-2 target nucleic acids are NOT DETECTED. The SARS-CoV-2 RNA is generally detectable in upper and lower  respiratory specimens during the acute phase of infection. The lowest  concentration of SARS-CoV-2 viral copies this assay can detect is 250  copies / mL. A negative result does not preclude SARS-CoV-2 infection  and should not be used as the sole basis for treatment or other  patient management decisions.  A negative result may occur with  improper specimen collection / handling, submission of specimen other  than nasopharyngeal swab, presence of viral mutation(s) within the  areas targeted by  this assay, and inadequate number of viral copies  (<250 copies / mL). A negative result must be combined with clinical  observations, patient history, and epidemiological information. If result is POSITIVE SARS-CoV-2 target nucleic acids are DETECTED. The SARS-CoV-2 RNA is generally detectable in upper and lower  respiratory specimens dur ing the acute phase of infection.  Positive  results are indicative of active infection with SARS-CoV-2.  Clinical  correlation with patient history and other diagnostic information is  necessary to determine patient infection status.  Positive results do  not rule out bacterial infection or co-infection with other viruses. If result is PRESUMPTIVE POSTIVE SARS-CoV-2 nucleic acids MAY BE PRESENT.   A presumptive positive result was obtained on the submitted specimen  and confirmed on repeat testing.  While 2019 novel coronavirus  (SARS-CoV-2) nucleic acids may be present in the submitted sample  additional confirmatory testing may be necessary for epidemiological  and / or clinical management purposes  to differentiate between  SARS-CoV-2 and other Sarbecovirus currently known to infect humans.  If clinically indicated additional testing with an alternate test  methodology [phone]) is advised. The SARS-CoV-2 RNA is generally  detectable in upper and lower respiratory sp ecimens during the acute  phase of infection. The expected result is Negative. Fact Sheet for Patients:  StrictlyIdeas.no Fact Sheet for Healthcare Providers: BankingDealers.co.za This test is not yet approved or cleared by the [place] FDA and has been authorized for detection and/or diagnosis of SARS-CoV-2 by FDA under an Emergency Use Authorization (EUA).  This EUA will remain in effect (meaning this test can be used) for the duration of the COVID-19 declaration under Section 564(b)(1) of the Act, 21 U.S.C. section  360bbb-3(b)(1), unless the authorization is terminated or revoked sooner. Performed at [facility], [place] [address], [place], [place] [address]        Inpatient Medications:   Scheduled Meds:  amLODipine  10 mg Oral Daily   aspirin EC  81 mg Oral Daily   enoxaparin (LOVENOX) injection  40 mg Subcutaneous Q24H   hydrochlorothiazide  50 mg Oral Daily   insulin aspart  0-15 Units Subcutaneous Q4H   levothyroxine  150 mcg Oral Q0600   loratadine  10 mg Oral Daily   metoprolol tartrate  25 mg Oral BID   pantoprazole  40 mg Oral Daily   Continuous Infusions:  cefTRIAXone (ROCEPHIN)  IV 2 g (08/08/18 0935)   dextrose 5 % and 0.45 % NaCl with KCl 20 mEq/L 75 mL/hr at 08/08/18 0334   lactated ringers 10 mL/hr at 08/07/18 0857     Radiological Exams on Admission: Ct Abdomen Pelvis W Contrast  Result Date: 08/07/2018 CLINICAL DATA:  Right lower quadrant pain radiating to the back EXAM: CT ABDOMEN AND PELVIS WITH CONTRAST TECHNIQUE: Multidetector CT imaging of the abdomen and pelvis was performed using the standard protocol following bolus administration of intravenous contrast. CONTRAST:  149mL OMNIPAQUE IOHEXOL 300 MG/ML  SOLN COMPARISON:  08/31/2016 FINDINGS: Lower chest: No acute finding. 22 mm lobulated posterior mediastinal mass left of the lower thoracic spine there remain stable and benign. Hepatobiliary: No focal liver abnormality.Distended gallbladder which contains a stone. No definite wall thickening but there is pericholecystic edema along the gallbladder liver interface. Pancreas: Unremarkable. Spleen: Unremarkable. Adrenals/Urinary Tract: 15 mm left adrenal nodule consistent with adenoma given stability. No hydronephrosis or stone. Unremarkable bladder. Stomach/Bowel:  No obstruction. No appendicitis. Vascular/Lymphatic: No acute vascular abnormality. Atherosclerosis. No mass or adenopathy. Reproductive:Seminal vesicle calcification often seen with diabetes.  Other: No ascites or pneumoperitoneum. Fatty enlargement of the left inguinal canal. Musculoskeletal: Spondylosis.  No acute abnormalities. IMPRESSION: 1. Full gallbladder with stone and mild pericholecystic edema, a biliary cause of pain is still considered despite the ultrasound. 2. Negative appendix. 3. Chronic findings are stable and described above. Electronically Signed   By: Monte Fantasia M.D.   On: 08/07/2018 05:11   US Abdomen Limited  Result Date: 08/07/2018 CLINICAL DATA:  73 year old male with right upper quadrant pain and vomiting. EXAM: ULTRASOUND ABDOMEN LIMITED RIGHT UPPER QUADRANT COMPARISON:  CT Abdomen and Pelvis 08/31/2016. Ultrasound 09/01/2016. FINDINGS: Gallbladder: Small volume dependent sludge. No cholelithiasis. Gallbladder wall thickness within normal limits. No pericholecystic fluid. No sonographic Murphy sign elicited. Common bile duct: Diameter: 4 millimeters, normal. Liver: Echogenic liver (image 29). No discrete liver lesion. No intrahepatic biliary ductal dilatation. Portal vein is patent on color Doppler imaging with normal direction of blood flow towards the liver. Other findings: Negative visible right kidney. IMPRESSION: 1. Mild gallbladder sludge. No gallstones, evidence of cholecystitis, or bile duct obstruction. 2. Chronic fatty liver disease. Electronically Signed   By: Genevie Ann M.D.   On: 08/07/2018 03:22   Dg Chest Port 1 View  Result Date: 08/08/2018 CLINICAL DATA:  Postop day 1 laparoscopic cholecystectomy, now with hypoxia. EXAM: PORTABLE CHEST 1 VIEW COMPARISON:  08/31/2016 and earlier. FINDINGS: Suboptimal inspiration accounts for crowded bronchovascular markings, especially in the bases, and accentuates the cardiac silhouette. Taking this into account, cardiac silhouette normal in size, unchanged. Lungs clear. Bronchovascular markings normal. Pulmonary vascularity normal. No visible pleural effusions. No pneumothorax. IMPRESSION: Suboptimal inspiration. No  acute cardiopulmonary disease. Electronically Signed   By: Evangeline Dakin M.D.   On: 08/08/2018 11:16    Impression/Recommendations Acute cholecystitis status post laparoscopic cholecystectomy: Postop day 1.  -per surgery  Acute respiratory failure with hypoxia: Patient documented to have O2 saturations as low as 82% on room air.  He is mildly tachycardic.  Chest x-ray showing suboptimal inspiration without any clear signs of edema or infiltrate.  Physical exam notes that the patient was splinting.  Wells criteria for pulmonary embolism equal to 3 which makes it less likely.  Hypoxia suspected most likely secondary to atelectasis in relation to pain following procedure. -Continue  incentive spirometry -Encouraged patient to utilize pain medication as needed  -Will check CT angiogram of the chest to rule out PE  Essential hypertension -Continue home meds amlodipine, hydrochlorothiazide, and metoprolol  Diabetes mellitus type 2: Patient's been includes metformin and 50 units of NPH daily. -Continue hypoglycemic protocol -CBGs q. before meals with sliding scale insulin -Start back patients  Hypothyroidism -Continue levothyroxine  Remote history of tobacco use: Patient with over 50 smoking pack year history, but reports quitting almost 9 years ago.  No signs of wheezing although decreased overall aeration. -Encouraged patient to continue with cessation of tobacco use   Thank you for this consultation.  [facility] hospitalist team will follow the patient with you.   Time Spent: 20 minutes  Norval Morton M.D. [facility] 08/08/2018, 11:35 AM

## 2018-08-08 NOTE — Progress Notes (Signed)
Patient's O2sats on Room Air at rest is 82%. While Ambulating to the bathroom, O2sats drop to 65% on Room air. Patient was hooked back on O2 at 2 Liters while ambulating and is on 92%, then drop to 83% while still on 2L.

## 2018-08-09 DIAGNOSIS — K8020 Calculus of gallbladder without cholecystitis without obstruction: Secondary | ICD-10-CM

## 2018-08-09 LAB — GLUCOSE, CAPILLARY
Glucose-Capillary: 147 mg/dL — ABNORMAL HIGH (ref 70–99)
Glucose-Capillary: 153 mg/dL — ABNORMAL HIGH (ref 70–99)
Glucose-Capillary: 169 mg/dL — ABNORMAL HIGH (ref 70–99)

## 2018-08-09 MED ORDER — POLYETHYLENE GLYCOL 3350 17 G PO PACK: 17.0000 g | PACK | Freq: Every day | ORAL | 0 refills | Status: DC | PRN

## 2018-08-09 MED ORDER — OXYCODONE HCL 5 MG PO TABS: 5.0000 mg | ORAL_TABLET | Freq: Four times a day (QID) | ORAL | 0 refills | Status: DC | PRN

## 2018-08-09 MED FILL — oxyCODONE HCL 5 MG TABS: 5 | 4 days supply | Qty: 15 | Fill #0

## 2018-08-09 NOTE — Progress Notes (Signed)
Patient discharged to home. Verbalizes understanding of all discharge instructions including incision care, discharge medications, and follow up MD visits. Patient's O2 tanks were delivered at the hospital room prior to discharge as well as RX. Patient transported by daughter in [employer].

## 2018-08-09 NOTE — Discharge Instructions (Signed)
[facility] ______[facility] °LAPAROSCOPIC SURGERY: POST OP INSTRUCTIONS °Always review your discharge instruction sheet given to you by the facility where your surgery was performed. °IF YOU HAVE DISABILITY OR FAMILY LEAVE FORMS, YOU MUST BRING THEM TO THE OFFICE FOR PROCESSING.   °DO NOT GIVE THEM TO YOUR DOCTOR. ° °1. A prescription for pain medication may be given to you upon discharge.  Take your pain medication as prescribed, if needed.  If narcotic pain medicine is not needed, then you may take acetaminophen (Tylenol) or ibuprofen (Advil) as needed. °2. Take your usually prescribed medications unless otherwise directed. °3. If you need a refill on your pain medication, please contact your pharmacy.  They will contact our office to request authorization. Prescriptions will not be filled after 5pm or on week-ends. °4. You should follow a light diet the first few days after arrival home, such as soup and crackers, etc.  Be sure to include lots of fluids daily. °5. Most patients will experience some swelling and bruising in the area of the incisions.  Ice packs will help.  Swelling and bruising can take several days to resolve.  °6. It is common to experience some constipation if taking pain medication after surgery.  Increasing fluid intake and taking a stool softener (such as Colace) will usually help or prevent this problem from occurring.  A mild laxative (Milk of Magnesia or Miralax) should be taken according to package instructions if there are no bowel movements after 48 hours. °7. Unless discharge instructions indicate otherwise, you may remove your bandages 24-48 hours after surgery, and you may shower at that time.  You may have steri-strips (small skin tapes) in place directly over the incision.  These strips should be left on the skin for 7-10 days.  If your surgeon used skin glue on the incision, you may shower in 24 hours.  The glue will flake off over the next 2-3 weeks.  Any sutures or  staples will be removed at the office during your follow-up visit. °8. ACTIVITIES:  You may resume regular (light) daily activities beginning the next day--such as daily self-care, walking, climbing stairs--gradually increasing activities as tolerated.  You may have sexual intercourse when it is comfortable.  Refrain from any heavy lifting or straining until approved by your doctor. °a. You may drive when you are no longer taking prescription pain medication, you can comfortably wear a seatbelt, and you can safely maneuver your car and apply brakes. °b. RETURN TO WORK:  __________________________________________________________ °9. You should see your doctor in the office for a follow-up appointment approximately 2-3 weeks after your surgery.  Make sure that you call for this appointment within a day or two after you arrive home to insure a convenient appointment time. °10. OTHER INSTRUCTIONS: __________________________________________________________________________________________________________________________ __________________________________________________________________________________________________________________________ °WHEN TO CALL YOUR DOCTOR: °1. Fever over 101.0 °2. Inability to urinate °3. Continued bleeding from incision. °4. Increased pain, redness, or drainage from the incision. °5. Increasing abdominal pain ° °The clinic staff is available to answer your questions during regular business hours.  Please don’t hesitate to call and ask to speak to one of the nurses for clinical concerns.  If you have a medical emergency, go to the nearest emergency room or call 911.  A surgeon from [facility] is always on call at the hospital. °[address], [address], [place], [place]  [address] ? [address] [address], [place],    [address] °[phone] ? [phone] ? FAX [phone] °Web site:   [email or web address]     Home Oxygen Use, Adult When a medical condition  keeps you from getting enough oxygen, your health care provider may instruct you to take extra oxygen at home. Your health care provider will let you know:  When to take oxygen.  For how long to take oxygen.  How quickly oxygen should be delivered (flow rate), in liters per minute (LPM or L/M). Home oxygen can be given through:  A mask.  A nasal cannula. This is a device or tube that goes in the nostrils.  A transtracheal catheter. This is a small, flexible tube placed in the trachea.  A tracheostomy. This is a surgically made opening in the trachea. These devices are connected with tubing to an oxygen source, such as:  A tank. Tanks hold oxygen in gas form. They must be replaced when the oxygen is used up.  A liquid oxygen device. This holds oxygen in liquid form. It must be replaced when the oxygen is used up.  An oxygen concentrator machine. This filters oxygen in the room. It uses electricity, so you must have a backup cylinder of oxygen in case the power goes out. Supplies needed: To use oxygen, you will need:  A mask, nasal cannula, transtracheal catheter, or tracheostomy.  An oxygen tank, a liquid oxygen device, or an oxygen concentrator.  The tape that your health care provider recommends (optional). If you use a transtracheal catheter and your prescribed flow rate is 1 LPM or greater, you will also need a humidifier. Risks and complications  Fire. This can happen if the oxygen is exposed to a heat source, flame, or spark.  Injury to skin. This can happen if liquid oxygen touches your skin.  Organ damage. This can happen if you get too little oxygen. How to use oxygen Your health care provider or a representative from your [place] will show you how to use your oxygen device. Follow her or his instructions. The instructions may look something like this: 1. Wash your hands. 2. If you use an oxygen concentrator, make sure it is plugged in. 3. Place one  end of the tube into the port on the tank, device, or machine. 4. Place the mask over your nose and mouth. Or, place the nasal cannula and secure it with tape if instructed. If you use a tracheostomy or transtracheal catheter, connect it to the oxygen source as directed. 5. Make sure the liter-flow setting on the machine is at the level prescribed by your health care provider. 6. Turn on the machine or adjust the knob on the tank or device to the correct liter-flow setting. 7. When you are done, turn off and unplug the machine, or turn the knob to OFF. How to clean and care for the oxygen supplies Nasal cannula  Clean it with a warm, wet cloth daily or as needed.  Wash it with a liquid soap once a week.  Rinse it thoroughly once or twice a week.  Replace it every 2-4 weeks.  If you have an infection, such as a cold or pneumonia, change the cannula when you get better. Mask  Replace it every 2-4 weeks.  If you have an infection, such as a cold or pneumonia, change the mask when you get better. Humidifier bottle  Wash the bottle between each refill: ? Wash it with soap and warm water. ? Rinse it thoroughly. ? Disinfect it and its top. ? Air-dry it.  Make sure it is dry before you  refill it. Oxygen concentrator  Clean the air filter at least twice a week according to directions from your home medical equipment and service company.  Wipe down the cabinet every day. To do this: ? Unplug the unit. ? Wipe down the cabinet with a damp cloth. ? Dry the cabinet. Other equipment  Change any extra tubing every 1-3 months.  Follow instructions from your health care provider about taking care of any other equipment. Safety tips Fire safety tips   Keep your oxygen and oxygen supplies at least 5 ft away from sources of heat, flames, and sparks at all times.  Do not allow smoking near your oxygen. Put up "no smoking" signs in your home. Avoid smoking areas when in public.  Do not  use materials that can burn (are flammable) while you use oxygen.  When you go to a restaurant with portable oxygen, ask to be seated in the nonsmoking section.  Keep a Data processing manager close by. Let your fire department know that you have oxygen in your home.  Test your home smoke detectors regularly. Traveling  Secure your oxygen tank in the vehicle so that it does not move around. Follow instructions from your medical device company about how to safely secure your tank.  Make sure you have enough oxygen for the amount of time you will be away from home.  If you are planning air travel, contact the airline to find out if they allow the use of an approved portable oxygen concentrator. You may also need documents from your health care provider and medical device company before you travel. General safety tips  If you use an oxygen cylinder, make sure it is in a stand or secured to an object that will not move (fixed object).  If you use liquid oxygen, make sure its container is kept upright.  If you use an oxygen concentrator: ? Dance movement psychotherapist company. Make sure you are given priority service in the event that your power goes out. ? Avoid using extension cords, if possible. Follow these instructions at home:  Use oxygen only as told by your health care provider.  Do not use alcohol or other drugs that make you relax (sedating drugs) unless instructed. They can slow down your breathing rate and make it hard to get in enough oxygen.  Know how and when to order a refill of oxygen.  Always keep a spare tank of oxygen. Plan ahead for holidays when you may not be able to get a prescription filled.  Use water-based lubricants on your lips or nostrils. Do not use oil-based products like petroleum jelly.  To prevent skin irritation on your cheeks or behind your ears, tuck some gauze under the tubing. Contact a health care provider if:  You get headaches often.  You have shortness  of breath.  You have a lasting cough.  You have anxiety.  You are sleepy all the time.  You develop an illness that affects your breathing.  You cannot exercise at your regular level.  You are restless.  You have difficult or irregular breathing, and it is getting worse.  You have a fever.  You have persistent redness under your nose. Get help right away if:  You are confused.  You have blue lips or fingernails.  You are struggling to breathe. Summary  Your health care provider or a representative from your [place] will show you how to use your oxygen device. Follow her or his instructions.  If  you use an oxygen concentrator, make sure it is plugged in.  Make sure the liter-flow setting on the machine is at the level prescribed by your health care provider.  Keep your oxygen and oxygen supplies at least 5 ft away from sources of heat, flames, and sparks at all times. This information is not intended to replace advice given to you by your health care provider. Make sure you discuss any questions you have with your health care provider. Document Released: 04/01/2003 Document Revised: 06/28/2017 Document Reviewed: 08/03/2015 Elsevier Patient Education  2020 [employer].

## 2018-08-09 NOTE — TOC Initial Note (Signed)
Transition of Care [facility]) - Initial/Assessment Note    Patient Details  Name: Christopher Patel MRN: [id number] Date of Birth: 01/27/1945  Transition of [facility] CM/SW Contact:    Marilu Favre, RN Phone Number: 08/09/2018, 10:49 AM  Clinical Narrative:                 Confirmed face sheet information.   Patient from home alone. Daughter in law will transport him home at discharge.   Discussed home oxygen. [facility] will bring portable oxygen tank to hospital room prior to discharge today, and will deliver concentrator to home. Patient voiced understanding.  Expected Discharge Plan: Home/Self Care Barriers to Discharge: No Barriers Identified   Patient Goals and CMS Choice Patient states their goals for this hospitalization and ongoing recovery are:: to go home CMS Medicare.gov Compare Post Acute Care list provided to:: Patient Choice offered to / list presented to : Patient  Expected Discharge Plan and Services Expected Discharge Plan: Home/Self Care   Discharge Planning Services: CM Consult Post Acute Care Choice: Durable Medical Equipment Living arrangements for the past 2 months: Single Family Home                 DME Arranged: Oxygen DME Agency: [facility] Date DME Agency Contacted: 08/09/18 Time DME Agency Contacted: [phone] Representative spoke with at DME Agency: [place]: NA          Prior Living Arrangements/Services Living arrangements for the past 2 months: Single Family Home Lives with:: Self Patient language and need for interpreter reviewed:: Yes Do you feel safe going back to the place where you live?: Yes      Need for Family Participation in Patient Care: No (Comment) Care giver support system in place?: Yes (comment)   Criminal Activity/Legal Involvement Pertinent to Current Situation/Hospitalization: No - Comment as needed  Activities of Daily Living      Permission Sought/Granted   Permission granted to share information  with : Yes, Verbal Permission Granted     Permission granted to share info w AGENCY: [facility]        Emotional Assessment Appearance:: Appears stated age Attitude/Demeanor/Rapport: Engaged Affect (typically observed): Accepting Orientation: : Oriented to Self, Oriented to Place, Oriented to  Time, Oriented to Situation Alcohol / Substance Use: Not Applicable Psych Involvement: No (comment)  Admission diagnosis:  Gallbladder colic [V37.48] Patient Active Problem List   Diagnosis Date Noted  . Acute cholecystitis 08/07/2018  . RUQ abdominal pain 08/31/2016  . Transaminitis 08/31/2016  . SIRS (systemic inflammatory response syndrome) ([place]) 08/31/2016  . Lactic acidosis 08/31/2016  . Paroxysmal atrial fibrillation with RVR ([place]) 07/02/2015  . Status post right knee replacement 05/04/2013  . Essential hypertension, benign 05/04/2013  . Diabetes mellitus ([place]) 05/04/2013  . Dyslipidemia 05/04/2013  . Allergic rhinitis 05/04/2013  . Abdominal pain, unspecified site 04/27/2013  . Arthritis of right knee 04/25/2013  . Hypothyroidism, postsurgical 05/17/2011  . Multinodular goiter (nontoxic) 04/05/2011   PCP:  Lujean Amel, MD Pharmacy:   [facility] [address], [place] - [address] [place] [place] [address] Phone: [phone] Fax: [facility] [place], [place] [address] Phone: [phone] Fax: [phone]     Social Determinants of Health (SDOH) Interventions    Readmission Risk Interventions No flowsheet data found.

## 2018-08-09 NOTE — Discharge Summary (Signed)
[facility] Surgery/Trauma Discharge Summary   Patient ID: Christopher Patel MRN: [id number] DOB/AGE: 1945/06/02 73 y.o.  Admit date: 08/07/2018 Discharge date: 08/09/2018  Admitting Diagnosis: Cholecystitis   Discharge Diagnosis Patient Active Problem List   Diagnosis Date Noted  . Acute cholecystitis 08/07/2018  . RUQ abdominal pain 08/31/2016  . Transaminitis 08/31/2016  . SIRS (systemic inflammatory response syndrome) ([place]) 08/31/2016  . Lactic acidosis 08/31/2016  . Paroxysmal atrial fibrillation with RVR ([employer]) 07/02/2015  . Status post right knee replacement 05/04/2013  . Essential hypertension, benign 05/04/2013  . Diabetes mellitus ([place]) 05/04/2013  . Dyslipidemia 05/04/2013  . Allergic rhinitis 05/04/2013  . Abdominal pain, unspecified site 04/27/2013  . Arthritis of right knee 04/25/2013  . Hypothyroidism, postsurgical 05/17/2011  . Multinodular goiter (nontoxic) 04/05/2011    Consultants medicine  Imaging: Ct Angio Chest Pe W Or Wo Contrast  Result Date: 08/08/2018 CLINICAL DATA:  Shortness of breath EXAM: CT ANGIOGRAPHY CHEST WITH CONTRAST TECHNIQUE: Multidetector CT imaging of the chest was performed using the standard protocol during bolus administration of intravenous contrast. Multiplanar CT image reconstructions and MIPs were obtained to evaluate the vascular anatomy. CONTRAST:  117mL OMNIPAQUE IOHEXOL 350 MG/ML SOLN COMPARISON:  August 08, 2018; chest CT February 14, 2011 FINDINGS: Cardiovascular: There is no demonstrable pulmonary embolus. There is no thoracic aortic aneurysm or dissection. There are foci of calcification in the proximal visualized great vessels. There is aortic atherosclerosis. There are foci of coronary artery calcification. There is a small pericardial effusion which may be within physiologic range. There is no appreciable pericardial thickening. Mediastinum/Nodes: Patient is status post thyroidectomy. There are scattered subcentimeter  mediastinal lymph nodes. There is a subcarinal lymph node measuring 1.7 x 1.5 cm. No esophageal lesions are evident. Lungs/Pleura: There is a small right pleural effusion. There is atelectatic change in each lower lobe without frank consolidation. There is a stable 2 mm nodular opacity in the superior segment left lower lobe seen on axial slice 38 series 7. No new nodular opacities are demonstrable. Upper Abdomen: Visualized upper abdominal structures appear unremarkable. Musculoskeletal: There is degenerative change in the thoracic spine. There are no blastic or lytic bone lesions. No chest wall lesions evident. Review of the MIP images confirms the above findings. IMPRESSION: 1. No demonstrable pulmonary embolus. No thoracic aortic aneurysm or dissection. There is aortic atherosclerosis as well as foci of great vessel and coronary artery calcification. 2.  Small pericardial effusion, likely in the physiologic range. 3. Bibasilar atelectasis with small right pleural effusion. No frank airspace consolidation. Stable 2 mm nodular opacity in the left lower lobe, a benign finding given stability since 2013. 4. Enlarged subcarinal lymph node of uncertain etiology. Scattered subcentimeter mediastinal lymph nodes as well. Status post thyroidectomy. Aortic Atherosclerosis (ICD10-I70.0). Electronically Signed   By: Lowella Grip III M.D.   On: 08/08/2018 14:33   Dg Chest Port 1 View  Result Date: 08/08/2018 CLINICAL DATA:  Postop day 1 laparoscopic cholecystectomy, now with hypoxia. EXAM: PORTABLE CHEST 1 VIEW COMPARISON:  08/31/2016 and earlier. FINDINGS: Suboptimal inspiration accounts for crowded bronchovascular markings, especially in the bases, and accentuates the cardiac silhouette. Taking this into account, cardiac silhouette normal in size, unchanged. Lungs clear. Bronchovascular markings normal. Pulmonary vascularity normal. No visible pleural effusions. No pneumothorax. IMPRESSION: Suboptimal inspiration.  No acute cardiopulmonary disease. Electronically Signed   By: Evangeline Dakin M.D.   On: 08/08/2018 11:16    Procedures Dr. Kieth Brightly (08/07/18) - Laparoscopic Cholecystectomy   HPI: 73 yo male  with long history of epigastric pain. He was evaluated for gallstones 2 years ago and recommended to have gallbladder removed. Since then he has occasional abdominal pains that come randomly. Last night he had a very intense episode and so came to the ER. He has some nausea. He denies diarrhea. Everyone in his family has had their gallbladder removed  Hospital Course:  Workup showed cholecysitits.  Patient was admitted and underwent procedure listed above.  Tolerated procedure well and was transferred to the floor.  Diet was advanced as tolerated. Pt required 4L of O2 [place] to keep O2 sats above 90. Medicine was consulted for assistance. CTA was negative for PE. Pt could not maintain O2 sats above 90 on RA so home oxygen was ordered. Pt has been instructed to follow up with PCP for further workup regarding new O2 requirement.  On POD#2, the patient was voiding well, tolerating diet, ambulating well, pain well controlled, vital signs stable, incisions c/d/i and felt stable for discharge home.  Patient will follow up as outlined below and knows to call with questions or concerns.     Patient was discharged in good condition.  The [place] Substance controlled database was reviewed prior to prescribing narcotic pain medication to this patient.  Physical Exam: Gen:  Alert, NAD, pleasant, cooperative Card:  RRR, no M/G/R heard Pulm:  CTA, distant breaths sounds throughout. no W/R/R, rate and effort normal Abd: Soft, ND, +BS, no HSM, incisions with glue intact appear well and are without bleeding or drainage. Mild TTP without guarding, no peritonitis  Skin: no rashes noted, warm and dry   Allergies as of 08/09/2018      Reactions   Actos [pioglitazone Hydrochloride] Nausea Only   Glyburide Other (See  Comments)   hypoglycemia   Metformin And Related Other (See Comments)   Hypoglycemia, shakes Fast acting metformin   Codeine Itching      Medication List    TAKE these medications   amLODipine 10 MG tablet Commonly known as: NORVASC Take 10 mg by mouth daily.   aspirin EC 81 MG tablet Take 81 mg by mouth daily.   B-D INS SYR ULTRAFINE 1CC/30G 30G X 1/2" 1 ML Misc Generic drug: Insulin Syringe-Needle U-100 USE TO INJECT INSULIN ONCE DAILY   fexofenadine 180 MG tablet Commonly known as: ALLEGRA Take 180 mg by mouth daily as needed for allergies.   fluticasone 27.5 MCG/SPRAY nasal spray Commonly known as: VERAMYST Place 2 sprays into the nose daily as needed for allergies.   hydrochlorothiazide 25 MG tablet Commonly known as: HYDRODIURIL Take 50 mg by mouth daily.   insulin NPH Human 100 UNIT/ML injection Commonly known as: NovoLIN N ReliOn Inject 0.55 mLs (55 Units total) into the skin every morning. And syringes 1/day What changed:   how much to take  when to take this  additional instructions   levothyroxine 150 MCG tablet Commonly known as: SYNTHROID Take 150 mcg by mouth daily before breakfast.   metFORMIN 500 MG tablet Commonly known as: GLUCOPHAGE Take 1,000 mg by mouth 2 (two) times daily with a meal.   metoprolol tartrate 25 MG tablet Commonly known as: LOPRESSOR TAKE 1 TABLET TWICE DAILY   multivitamin tablet Take 1 tablet by mouth daily.   oxyCODONE 5 MG immediate release tablet Commonly known as: Oxy IR/ROXICODONE Take 1 tablet (5 mg total) by mouth every 6 (six) hours as needed for moderate pain.   pantoprazole 40 MG tablet Commonly known as: PROTONIX Take 40 mg  by mouth daily.   polyethylene glycol 17 g packet Commonly known as: MIRALAX / GLYCOLAX Take 17 g by mouth daily as needed for mild constipation.   TYLENOL 500 MG tablet Generic drug: acetaminophen Take 500 mg by mouth every 6 (six) hours as needed for moderate pain (BACK  PAIN).            Durable Medical Equipment  (From admission, onward)         Start     Ordered   08/09/18 1008  For home use only DME oxygen  Once    Question Answer Comment  Length of Need 6 Months   Mode or (Route) Nasal cannula   Liters per Minute 4   Frequency Continuous (stationary and portable oxygen unit needed)   Oxygen delivery system Gas      08/09/18 1007           Follow-up [facility] Follow up on 08/22/2018.   Specialty: General Surgery Why: 07/30 at 3 pm a provider will call you for your telehealth appointment. Please take a photo of your incisions and send via email to [email or web address] 2 days prior. Please include your name and DOB in the subject line.  Contact information: [address] [place], [place], [place]. Schedule an appointment as soon as possible for a visit in 1 week(s).   Specialty: Family Medicine Why: to be seen for further workup pertaining to new oxygen requirement.  Contact information: [place] [address] [phone]           Signed: [facility] 08/09/2018, 11:08 AM Pager: [phone] Consults: [phone] Mon-Fri 7:00 am-4:30 pm Sat-Sun 7:00 am-11:30 am

## 2018-08-09 NOTE — Progress Notes (Signed)
SATURATION QUALIFICATIONS: (This note is used to comply with regulatory documentation for home oxygen)  Patient Saturations on Room Air at Rest = 90%  Patient Saturations on Room Air while Ambulating = 84%  Patient Saturations on 4 Liters of oxygen while Ambulating = 90%  Please briefly explain why patient needs home oxygen:

## 2018-08-09 NOTE — Progress Notes (Addendum)
   Interim   73 year old male Caucasian male DM TY since 2017, HTN,?  NASH, prior knee arthroplasties 2015, previous multinodular goiter follicular lesion 5300 status post surgery Dr. Catalina Antigua, prior smoker 50 pack history and still smoking polypectomy on colonoscopy 2007-Dr. Edwards Admitted general surgery 08/07/2018 abdominal pain-found to have cholecystitis-lap chole performed 7/15  Hospitalist service consulted for sats 82%-work-up included CT chest that was negative however showed some nodles      Impression  Hypoxic respiratory failure 2/2 underlying COPD Habitus for sleep apnea  Agree with plan for oxygen at home Probably needs split study for OSA and testing at night-modified STOP-BANG at the bedside did not reveal any daytime somnolence or other issues but given his habitus once again would recommend the same  Thank you for this consult we will sign off and would suggest: Outpatient sleep apnea testing Outpatient low-dose CT scan for nodules given his 50-pack-year history Home oxygen   Subjective/interval events   He does not have any wheeze today He tells me that he is going home with oxygen per the surgeon  Objective     BP 129/70 (BP Location: Right Arm)   Pulse 91   Temp 98.5 F (36.9 C) (Oral)   Resp 18   Ht 5\' 9"  (1.753 m)   Wt 99.8 kg   SpO2 92%   BMI 32.49 kg/m      Intake/Output Summary (Last 24 hours) at 08/09/2018 0850 Last data filed at 08/09/2018 0813 Gross per 24 hour  Intake 2708.7 ml  Output 650 ml  Net 2058.7 ml    GPE  Alert coherent pleasant no distress EOMI NCAT No icterus no pallor Abdomen soft wounds seen well intention No wheeze no rales no rhonchi

## 2018-08-16 DIAGNOSIS — Z7984 Long term (current) use of oral hypoglycemic drugs: Secondary | ICD-10-CM | POA: Diagnosis not present

## 2018-08-16 DIAGNOSIS — Z79899 Other long term (current) drug therapy: Secondary | ICD-10-CM | POA: Diagnosis not present

## 2018-08-16 DIAGNOSIS — Z9981 Dependence on supplemental oxygen: Secondary | ICD-10-CM | POA: Diagnosis not present

## 2018-08-16 DIAGNOSIS — I10 Essential (primary) hypertension: Secondary | ICD-10-CM | POA: Diagnosis not present

## 2018-08-16 DIAGNOSIS — I480 Paroxysmal atrial fibrillation: Secondary | ICD-10-CM | POA: Diagnosis not present

## 2018-08-16 DIAGNOSIS — Z9049 Acquired absence of other specified parts of digestive tract: Secondary | ICD-10-CM | POA: Diagnosis not present

## 2018-08-16 DIAGNOSIS — E1165 Type 2 diabetes mellitus with hyperglycemia: Secondary | ICD-10-CM | POA: Diagnosis not present

## 2018-08-17 ENCOUNTER — Other Ambulatory Visit: Payer: Self-pay | Admitting: Endocrinology

## 2018-08-17 NOTE — Telephone Encounter (Signed)
Please refill x 1 F/u is due  

## 2018-08-23 DIAGNOSIS — Z79899 Other long term (current) drug therapy: Secondary | ICD-10-CM | POA: Diagnosis not present

## 2018-08-27 DIAGNOSIS — M65332 Trigger finger, left middle finger: Secondary | ICD-10-CM | POA: Diagnosis not present

## 2018-08-28 DIAGNOSIS — R0602 Shortness of breath: Secondary | ICD-10-CM | POA: Diagnosis not present

## 2018-08-28 DIAGNOSIS — Z9981 Dependence on supplemental oxygen: Secondary | ICD-10-CM | POA: Diagnosis not present

## 2018-09-03 DIAGNOSIS — G4733 Obstructive sleep apnea (adult) (pediatric): Secondary | ICD-10-CM | POA: Diagnosis not present

## 2018-09-04 DIAGNOSIS — G4733 Obstructive sleep apnea (adult) (pediatric): Secondary | ICD-10-CM | POA: Diagnosis not present

## 2018-09-11 DIAGNOSIS — H5210 Myopia, unspecified eye: Secondary | ICD-10-CM | POA: Diagnosis not present

## 2018-09-16 DIAGNOSIS — G4733 Obstructive sleep apnea (adult) (pediatric): Secondary | ICD-10-CM | POA: Diagnosis not present

## 2018-09-26 DIAGNOSIS — H26491 Other secondary cataract, right eye: Secondary | ICD-10-CM | POA: Diagnosis not present

## 2018-09-26 DIAGNOSIS — Z961 Presence of intraocular lens: Secondary | ICD-10-CM | POA: Diagnosis not present

## 2018-10-10 DIAGNOSIS — E7800 Pure hypercholesterolemia, unspecified: Secondary | ICD-10-CM | POA: Diagnosis not present

## 2018-10-10 DIAGNOSIS — E039 Hypothyroidism, unspecified: Secondary | ICD-10-CM | POA: Diagnosis not present

## 2018-10-10 DIAGNOSIS — N401 Enlarged prostate with lower urinary tract symptoms: Secondary | ICD-10-CM | POA: Diagnosis not present

## 2018-10-10 DIAGNOSIS — E1165 Type 2 diabetes mellitus with hyperglycemia: Secondary | ICD-10-CM | POA: Diagnosis not present

## 2018-10-10 DIAGNOSIS — E785 Hyperlipidemia, unspecified: Secondary | ICD-10-CM | POA: Diagnosis not present

## 2018-10-10 DIAGNOSIS — I10 Essential (primary) hypertension: Secondary | ICD-10-CM | POA: Diagnosis not present

## 2018-10-10 DIAGNOSIS — I480 Paroxysmal atrial fibrillation: Secondary | ICD-10-CM | POA: Diagnosis not present

## 2018-10-10 DIAGNOSIS — I2510 Atherosclerotic heart disease of native coronary artery without angina pectoris: Secondary | ICD-10-CM | POA: Diagnosis not present

## 2018-10-11 DIAGNOSIS — Z23 Encounter for immunization: Secondary | ICD-10-CM | POA: Diagnosis not present

## 2018-10-11 DIAGNOSIS — L508 Other urticaria: Secondary | ICD-10-CM | POA: Diagnosis not present

## 2018-10-11 DIAGNOSIS — L509 Urticaria, unspecified: Secondary | ICD-10-CM | POA: Diagnosis not present

## 2018-10-17 DIAGNOSIS — G4733 Obstructive sleep apnea (adult) (pediatric): Secondary | ICD-10-CM | POA: Diagnosis not present

## 2018-10-22 DIAGNOSIS — Z794 Long term (current) use of insulin: Secondary | ICD-10-CM | POA: Diagnosis not present

## 2018-10-22 DIAGNOSIS — I480 Paroxysmal atrial fibrillation: Secondary | ICD-10-CM | POA: Diagnosis not present

## 2018-10-22 DIAGNOSIS — Z86010 Personal history of colonic polyps: Secondary | ICD-10-CM | POA: Diagnosis not present

## 2018-10-22 DIAGNOSIS — E1165 Type 2 diabetes mellitus with hyperglycemia: Secondary | ICD-10-CM | POA: Diagnosis not present

## 2018-10-22 DIAGNOSIS — Z800 Family history of malignant neoplasm of digestive organs: Secondary | ICD-10-CM | POA: Diagnosis not present

## 2018-10-22 DIAGNOSIS — I10 Essential (primary) hypertension: Secondary | ICD-10-CM | POA: Diagnosis not present

## 2018-11-07 DIAGNOSIS — I2510 Atherosclerotic heart disease of native coronary artery without angina pectoris: Secondary | ICD-10-CM | POA: Diagnosis not present

## 2018-11-07 DIAGNOSIS — E785 Hyperlipidemia, unspecified: Secondary | ICD-10-CM | POA: Diagnosis not present

## 2018-11-07 DIAGNOSIS — N401 Enlarged prostate with lower urinary tract symptoms: Secondary | ICD-10-CM | POA: Diagnosis not present

## 2018-11-07 DIAGNOSIS — I480 Paroxysmal atrial fibrillation: Secondary | ICD-10-CM | POA: Diagnosis not present

## 2018-11-07 DIAGNOSIS — E7800 Pure hypercholesterolemia, unspecified: Secondary | ICD-10-CM | POA: Diagnosis not present

## 2018-11-07 DIAGNOSIS — E1165 Type 2 diabetes mellitus with hyperglycemia: Secondary | ICD-10-CM | POA: Diagnosis not present

## 2018-11-07 DIAGNOSIS — I10 Essential (primary) hypertension: Secondary | ICD-10-CM | POA: Diagnosis not present

## 2018-11-07 DIAGNOSIS — E039 Hypothyroidism, unspecified: Secondary | ICD-10-CM | POA: Diagnosis not present

## 2018-11-16 DIAGNOSIS — G4733 Obstructive sleep apnea (adult) (pediatric): Secondary | ICD-10-CM | POA: Diagnosis not present

## 2018-12-05 DIAGNOSIS — G4733 Obstructive sleep apnea (adult) (pediatric): Secondary | ICD-10-CM | POA: Diagnosis not present

## 2018-12-11 DIAGNOSIS — Z1159 Encounter for screening for other viral diseases: Secondary | ICD-10-CM | POA: Diagnosis not present

## 2018-12-12 ENCOUNTER — Other Ambulatory Visit: Payer: Self-pay | Admitting: Cardiology

## 2018-12-16 DIAGNOSIS — Z800 Family history of malignant neoplasm of digestive organs: Secondary | ICD-10-CM | POA: Diagnosis not present

## 2018-12-16 DIAGNOSIS — K635 Polyp of colon: Secondary | ICD-10-CM | POA: Diagnosis not present

## 2018-12-16 DIAGNOSIS — D124 Benign neoplasm of descending colon: Secondary | ICD-10-CM | POA: Diagnosis not present

## 2018-12-16 DIAGNOSIS — Z86010 Personal history of colonic polyps: Secondary | ICD-10-CM | POA: Diagnosis not present

## 2018-12-17 DIAGNOSIS — G4733 Obstructive sleep apnea (adult) (pediatric): Secondary | ICD-10-CM | POA: Diagnosis not present

## 2018-12-23 DIAGNOSIS — D124 Benign neoplasm of descending colon: Secondary | ICD-10-CM | POA: Diagnosis not present

## 2018-12-23 DIAGNOSIS — K635 Polyp of colon: Secondary | ICD-10-CM | POA: Diagnosis not present

## 2018-12-30 ENCOUNTER — Telehealth: Payer: Self-pay

## 2018-12-30 NOTE — Telephone Encounter (Signed)
Per Dr. Ellison, unable to refill Novolin N without an appt. Routing this message to the front desk for scheduling purposes.  

## 2019-01-01 ENCOUNTER — Other Ambulatory Visit: Payer: Self-pay

## 2019-01-01 ENCOUNTER — Telehealth: Payer: Self-pay | Admitting: Endocrinology

## 2019-01-01 DIAGNOSIS — E119 Type 2 diabetes mellitus without complications: Secondary | ICD-10-CM

## 2019-01-01 DIAGNOSIS — Z794 Long term (current) use of insulin: Secondary | ICD-10-CM

## 2019-01-01 MED ORDER — INSULIN NPH (HUMAN) (ISOPHANE) 100 UNIT/ML ~~LOC~~ SUSP: 55.0000 [IU] | SUBCUTANEOUS | 0 refills | Status: DC

## 2019-01-01 NOTE — Telephone Encounter (Signed)
insulin NPH Human (NOVOLIN N) 100 UNIT/ML injection 15 mL 0 01/01/2019    Sig - Route: Inject 0.55 mLs (55 Units total) into the skin every morning. Will provide 30 day supply. Overdue for an appt - Subcutaneous   Sent to pharmacy as: insulin NPH Human (NOVOLIN N) 100 UNIT/ML injection   E-Prescribing Status: Receipt confirmed by pharmacy (01/01/2019 10:21 AM EST)

## 2019-01-01 NOTE — Telephone Encounter (Signed)
Christopher Patel from [facility] ph# [phone] called to request clarification for Novolin N RX that he received (is it vial or pen?). Please contact the above to advise

## 2019-01-01 NOTE — Telephone Encounter (Signed)
Pt called and scheduled an appt for 12/10. Pt stated that he is out of medication and would like a 30 day supply called into [facility] on [address]

## 2019-01-01 NOTE — Telephone Encounter (Signed)
insulin NPH Human (NOVOLIN N) 100 UNIT/ML injection [id number]   Rx sent above is in vial format NOT pen. No clarification required as Rx is NOT written for a PEN.

## 2019-01-02 ENCOUNTER — Ambulatory Visit (INDEPENDENT_AMBULATORY_CARE_PROVIDER_SITE_OTHER): Payer: Medicare HMO | Admitting: Endocrinology

## 2019-01-02 ENCOUNTER — Encounter: Payer: Self-pay | Admitting: Endocrinology

## 2019-01-02 VITALS — BP 120/60 | HR 82 | Ht 69.0 in | Wt 235.2 lb

## 2019-01-02 DIAGNOSIS — E119 Type 2 diabetes mellitus without complications: Secondary | ICD-10-CM | POA: Diagnosis not present

## 2019-01-02 DIAGNOSIS — Z794 Long term (current) use of insulin: Secondary | ICD-10-CM

## 2019-01-02 LAB — POCT GLYCOSYLATED HEMOGLOBIN (HGB A1C): Hemoglobin A1C: 7.2 % — AB (ref 4.0–5.6)

## 2019-01-02 MED ORDER — INSULIN NPH (HUMAN) (ISOPHANE) 100 UNIT/ML ~~LOC~~ SUSP: 55.0000 [IU] | SUBCUTANEOUS | 11 refills | Status: DC

## 2019-01-02 MED ORDER — INSULIN NPH (HUMAN) (ISOPHANE) 100 UNIT/ML ~~LOC~~ SUSP: 55.0000 [IU] | SUBCUTANEOUS | 3 refills | Status: DC

## 2019-01-02 NOTE — Patient Instructions (Addendum)
Please continue the same insulin.   check your blood sugar twice a day.  vary the time of day when you check, between before the 3 meals, and at bedtime.  also check if you have symptoms of your blood sugar being too high or too low.  please keep a record of the readings and bring it to your next appointment here (or you can bring the meter itself).  You can write it on any piece of paper.  please call us sooner if your blood sugar goes below 70, or if you have a lot of readings over 200.  Please come back for a follow-up appointment in 3 months.     

## 2019-01-02 NOTE — Progress Notes (Signed)
Subjective:    Patient ID: Christopher Patel, male    DOB: [birth year], 73 y.o.   MRN: [id number]  HPI Pt returns for f/u of diabetes mellitus: DM type: Insulin-requiring type 2 Dx'ed: 0000000 Complications: none Therapy: insulin since 2017, and metformin.   DKA: never.  Severe hypoglycemia: never.   Pancreatitis: never.  Other: he takes human insulin, due to cost; he declines multiple daily injections.   Interval history: pt states cbg's vary from 98-150.  He checks fasting only.  pt states he feels well in general.  no recent steroids.  Pt says he no longer has tremor in the middle of the night (since he started taking the insulin when he awakens in the morning).  Past Medical History:  Diagnosis Date  . CAD (coronary artery disease)   . Change in voice   . Cough   . Depression   . Diabetes mellitus 04-25-11   oral meds  . ED (erectile dysfunction)   . Hepatic lesion    left lobe  . Hyperlipidemia   . Hypertension    dr Marilynn Rail  . Hypothyroidism   . Lung nodule   . Nasal congestion   . Osteoarthritis 04-25-11   spine area-some neck issues  . Paroxysmal atrial fibrillation (HCC)    started on metoprolol and Xarelto 07/02/2015 for afib RVR  . Pneumonia   . PONV (postoperative nausea and vomiting)   . Thyroid disease    thyroid lesion  . Tubular adenoma    polyps- Dr.Edwards    Past Surgical History:  Procedure Laterality Date  . CARDIAC CATHETERIZATION  04-25-11   many yrs ago-very slight blockage 1 vessel (60% RCA by Dr. Dorthy Cooler notes)  . CHOLECYSTECTOMY N/A 08/07/2018   Procedure: LAPAROSCOPIC CHOLECYSTECTOMY;  Surgeon: Kinsinger, Arta Bruce, MD;  Location: [place];  Service: General;  Laterality: N/A;  . EYE SURGERY  11/2010   cataract x2  . EYE SURGERY  12/2010   len implant x2   . KNEE SURGERY  04-25-11   right/ left knee scope  . THYROIDECTOMY  05/02/2011   Procedure: THYROIDECTOMY;  Surgeon: Earnstine Regal, MD;  Location: [facility];  Service: General;  Laterality:  N/A;  Total Thyroidectomy  . TOTAL KNEE ARTHROPLASTY Right 04/25/2013   Procedure: TOTAL KNEE ARTHROPLASTY;  Surgeon: Kerin Salen, MD;  Location: [place];  Service: Orthopedics;  Laterality: Right;    Social History   Socioeconomic History  . Marital status: Widowed    Spouse name: Not on file  . Number of children: Not on file  . Years of education: Not on file  . Highest education level: Not on file  Occupational History  . Not on file  Tobacco Use  . Smoking status: Former Smoker    Years: 50.00    Quit date: 11/24/2009    Years since quitting: 9.1  . Smokeless tobacco: Never Used  Substance and Sexual Activity  . Alcohol use: Yes    Comment: Beer on weekends, 6 or more  . Drug use: No  . Sexual activity: Never  Other Topics Concern  . Not on file  Social History Narrative  . Not on file   Social Determinants of Health   Financial Resource Strain:   . Difficulty of Paying Living Expenses: Not on file  Food Insecurity:   . Worried About Charity fundraiser in the Last Year: Not on file  . Ran Out of Food in the Last Year: Not  on file  Transportation Needs:   . Lack of Transportation (Medical): Not on file  . Lack of Transportation (Non-Medical): Not on file  Physical Activity:   . Days of Exercise per Week: Not on file  . Minutes of Exercise per Session: Not on file  Stress:   . Feeling of Stress : Not on file  Social Connections:   . Frequency of Communication with Friends and Family: Not on file  . Frequency of Social Gatherings with Friends and Family: Not on file  . Attends Religious Services: Not on file  . Active Member of Clubs or Organizations: Not on file  . Attends Archivist Meetings: Not on file  . Marital Status: Not on file  Intimate Partner Violence:   . Fear of Current or Ex-Partner: Not on file  . Emotionally Abused: Not on file  . Physically Abused: Not on file  . Sexually Abused: Not on file    Current Outpatient Medications on  File Prior to Visit  Medication Sig Dispense Refill  . acetaminophen (TYLENOL) 500 MG tablet Take 500 mg by mouth every 6 (six) hours as needed for moderate pain (BACK PAIN).    Marland Kitchen amLODipine (NORVASC) 10 MG tablet Take 10 mg by mouth daily.    Marland Kitchen aspirin EC 81 MG tablet Take 81 mg by mouth daily.    . B-D INS SYR ULTRAFINE 1CC/30G 30G X 1/2" 1 ML MISC USE TO INJECT INSULIN ONCE DAILY 90 each 2  . fexofenadine (ALLEGRA) 180 MG tablet Take 180 mg by mouth daily as needed for allergies.     . fluticasone (VERAMYST) 27.5 MCG/SPRAY nasal spray Place 2 sprays into the nose daily as needed for allergies.     . hydrochlorothiazide (HYDRODIURIL) 25 MG tablet Take 50 mg by mouth daily.     Marland Kitchen levothyroxine (SYNTHROID, LEVOTHROID) 150 MCG tablet Take 150 mcg by mouth daily before breakfast.    . metFORMIN (GLUCOPHAGE) 500 MG tablet Take 1,000 mg by mouth 2 (two) times daily with a meal.    . metoprolol tartrate (LOPRESSOR) 25 MG tablet TAKE 1 TABLET TWICE DAILY (Patient taking differently: Take 25 mg by mouth 2 (two) times daily. ) 180 tablet 3  . Multiple Vitamin (MULTIVITAMIN) tablet Take 1 tablet by mouth daily.    Marland Kitchen oxyCODONE (OXY IR/ROXICODONE) 5 MG immediate release tablet Take 1 tablet (5 mg total) by mouth every 6 (six) hours as needed for moderate pain. 15 tablet 0  . pantoprazole (PROTONIX) 40 MG tablet Take 40 mg by mouth daily.    . polyethylene glycol (MIRALAX / GLYCOLAX) 17 g packet Take 17 g by mouth daily as needed for mild constipation. 14 each 0   No current facility-administered medications on file prior to visit.    Allergies  Allergen Reactions  . Actos [Pioglitazone Hydrochloride] Nausea Only  . Glyburide Other (See Comments)    hypoglycemia  . Metformin And Related Other (See Comments)    Hypoglycemia, shakes Fast acting metformin  . Codeine Itching    Family History  Problem Relation Age of Onset  . Diabetes Mother   . Diabetes Brother   . Colon cancer Brother   . COPD  Other   . Hypertension Other   . Hyperlipidemia Other   . Heart attack Other   . Diabetes Sister     BP 120/60 (BP Location: Left Arm, Patient Position: Sitting, Cuff Size: Large)   Pulse 82   Ht 5\' 9"  (1.753 m)  Wt 235 lb 3.2 oz (106.7 kg)   SpO2 93%   BMI 34.73 kg/m    Review of Systems He denies hypoglycemia    Objective:   Physical Exam VITAL SIGNS:  See vs page GENERAL: no distress Pulses: dorsalis pedis intact bilat.   MSK: no deformity of the feet CV: 1+ bilat leg edema Skin:  no ulcer on the feet.  normal color and temp on the feet. Neuro: sensation is intact to touch on the feet  Ext: there is bilateral onychomycosis of the toenails.    Lab Results  Component Value Date   HGBA1C 7.2 (A) 01/02/2019        Assessment & Plan:  Insulin-requiring type 2 DM: well-controlled Edema: This limits rx options   Patient Instructions  Please continue the same insulin. check your blood sugar twice a day.  vary the time of day when you check, between before the 3 meals, and at bedtime.  also check if you have symptoms of your blood sugar being too high or too low.  please keep a record of the readings and bring it to your next appointment here (or you can bring the meter itself).  You can write it on any piece of paper.  please call us sooner if your blood sugar goes below 70, or if you have a lot of readings over 200.   Please come back for a follow-up appointment in 3 months.

## 2019-01-03 ENCOUNTER — Other Ambulatory Visit: Payer: Self-pay | Admitting: Endocrinology

## 2019-01-03 DIAGNOSIS — Z794 Long term (current) use of insulin: Secondary | ICD-10-CM

## 2019-01-06 ENCOUNTER — Other Ambulatory Visit: Payer: Self-pay

## 2019-01-06 DIAGNOSIS — E119 Type 2 diabetes mellitus without complications: Secondary | ICD-10-CM

## 2019-01-06 DIAGNOSIS — Z794 Long term (current) use of insulin: Secondary | ICD-10-CM

## 2019-01-06 MED ORDER — INSULIN NPH (HUMAN) (ISOPHANE) 100 UNIT/ML ~~LOC~~ SUSP: 55.0000 [IU] | Freq: Every day | SUBCUTANEOUS | 2 refills | Status: DC

## 2019-01-09 DIAGNOSIS — Z20828 Contact with and (suspected) exposure to other viral communicable diseases: Secondary | ICD-10-CM | POA: Diagnosis not present

## 2019-01-14 DIAGNOSIS — I480 Paroxysmal atrial fibrillation: Secondary | ICD-10-CM | POA: Diagnosis not present

## 2019-01-14 DIAGNOSIS — E039 Hypothyroidism, unspecified: Secondary | ICD-10-CM | POA: Diagnosis not present

## 2019-01-14 DIAGNOSIS — I10 Essential (primary) hypertension: Secondary | ICD-10-CM | POA: Diagnosis not present

## 2019-01-14 DIAGNOSIS — Z7984 Long term (current) use of oral hypoglycemic drugs: Secondary | ICD-10-CM | POA: Diagnosis not present

## 2019-01-14 DIAGNOSIS — I2510 Atherosclerotic heart disease of native coronary artery without angina pectoris: Secondary | ICD-10-CM | POA: Diagnosis not present

## 2019-01-14 DIAGNOSIS — E1165 Type 2 diabetes mellitus with hyperglycemia: Secondary | ICD-10-CM | POA: Diagnosis not present

## 2019-01-14 DIAGNOSIS — E7800 Pure hypercholesterolemia, unspecified: Secondary | ICD-10-CM | POA: Diagnosis not present

## 2019-01-14 DIAGNOSIS — N401 Enlarged prostate with lower urinary tract symptoms: Secondary | ICD-10-CM | POA: Diagnosis not present

## 2019-01-16 DIAGNOSIS — G4733 Obstructive sleep apnea (adult) (pediatric): Secondary | ICD-10-CM | POA: Diagnosis not present

## 2019-01-28 DIAGNOSIS — I10 Essential (primary) hypertension: Secondary | ICD-10-CM | POA: Diagnosis not present

## 2019-01-28 DIAGNOSIS — I2510 Atherosclerotic heart disease of native coronary artery without angina pectoris: Secondary | ICD-10-CM | POA: Diagnosis not present

## 2019-01-28 DIAGNOSIS — N529 Male erectile dysfunction, unspecified: Secondary | ICD-10-CM | POA: Diagnosis not present

## 2019-01-28 DIAGNOSIS — Z0001 Encounter for general adult medical examination with abnormal findings: Secondary | ICD-10-CM | POA: Diagnosis not present

## 2019-01-28 DIAGNOSIS — F5101 Primary insomnia: Secondary | ICD-10-CM | POA: Diagnosis not present

## 2019-01-28 DIAGNOSIS — E7800 Pure hypercholesterolemia, unspecified: Secondary | ICD-10-CM | POA: Diagnosis not present

## 2019-01-28 DIAGNOSIS — N401 Enlarged prostate with lower urinary tract symptoms: Secondary | ICD-10-CM | POA: Diagnosis not present

## 2019-01-28 DIAGNOSIS — E039 Hypothyroidism, unspecified: Secondary | ICD-10-CM | POA: Diagnosis not present

## 2019-01-28 DIAGNOSIS — E1169 Type 2 diabetes mellitus with other specified complication: Secondary | ICD-10-CM | POA: Diagnosis not present

## 2019-02-16 DIAGNOSIS — G4733 Obstructive sleep apnea (adult) (pediatric): Secondary | ICD-10-CM | POA: Diagnosis not present

## 2019-02-20 DIAGNOSIS — Z0001 Encounter for general adult medical examination with abnormal findings: Secondary | ICD-10-CM | POA: Diagnosis not present

## 2019-02-20 DIAGNOSIS — E7800 Pure hypercholesterolemia, unspecified: Secondary | ICD-10-CM | POA: Diagnosis not present

## 2019-02-20 DIAGNOSIS — Z79899 Other long term (current) drug therapy: Secondary | ICD-10-CM | POA: Diagnosis not present

## 2019-02-20 DIAGNOSIS — Z125 Encounter for screening for malignant neoplasm of prostate: Secondary | ICD-10-CM | POA: Diagnosis not present

## 2019-02-20 DIAGNOSIS — E039 Hypothyroidism, unspecified: Secondary | ICD-10-CM | POA: Diagnosis not present

## 2019-02-26 DIAGNOSIS — F5101 Primary insomnia: Secondary | ICD-10-CM | POA: Diagnosis not present

## 2019-03-19 DIAGNOSIS — G4733 Obstructive sleep apnea (adult) (pediatric): Secondary | ICD-10-CM | POA: Diagnosis not present

## 2019-03-21 DIAGNOSIS — E039 Hypothyroidism, unspecified: Secondary | ICD-10-CM | POA: Diagnosis not present

## 2019-03-24 ENCOUNTER — Other Ambulatory Visit: Payer: Self-pay | Admitting: Endocrinology

## 2019-03-24 DIAGNOSIS — E119 Type 2 diabetes mellitus without complications: Secondary | ICD-10-CM

## 2019-03-24 DIAGNOSIS — Z794 Long term (current) use of insulin: Secondary | ICD-10-CM

## 2019-04-02 ENCOUNTER — Ambulatory Visit: Payer: Medicare HMO | Admitting: Endocrinology

## 2019-04-16 DIAGNOSIS — G4733 Obstructive sleep apnea (adult) (pediatric): Secondary | ICD-10-CM | POA: Diagnosis not present

## 2019-05-17 DIAGNOSIS — G4733 Obstructive sleep apnea (adult) (pediatric): Secondary | ICD-10-CM | POA: Diagnosis not present

## 2019-05-26 DIAGNOSIS — E039 Hypothyroidism, unspecified: Secondary | ICD-10-CM | POA: Diagnosis not present

## 2019-05-26 DIAGNOSIS — N529 Male erectile dysfunction, unspecified: Secondary | ICD-10-CM | POA: Diagnosis not present

## 2019-05-26 DIAGNOSIS — G4700 Insomnia, unspecified: Secondary | ICD-10-CM | POA: Diagnosis not present

## 2019-06-16 DIAGNOSIS — G4733 Obstructive sleep apnea (adult) (pediatric): Secondary | ICD-10-CM | POA: Diagnosis not present

## 2019-07-17 DIAGNOSIS — G4733 Obstructive sleep apnea (adult) (pediatric): Secondary | ICD-10-CM | POA: Diagnosis not present

## 2019-07-24 DIAGNOSIS — M1712 Unilateral primary osteoarthritis, left knee: Secondary | ICD-10-CM | POA: Diagnosis not present

## 2019-07-31 ENCOUNTER — Telehealth: Payer: Self-pay

## 2019-07-31 NOTE — Telephone Encounter (Signed)
Received request from [facility] for office notes and completion of their surgical clearance form. However, pt has not been seen since 12/2018 and will require an appt before he can be cleared. Faxed message to [facility] them that an appt will be required. Confirmation received. Also called pt and informed him that since he has not been seen since 12/2018, he will require an appt. Pt scheduled for f/u and clearance 08/27/19 @ 245pm

## 2019-08-04 ENCOUNTER — Ambulatory Visit: Payer: Medicare HMO | Admitting: Cardiology

## 2019-08-04 ENCOUNTER — Encounter: Payer: Self-pay | Admitting: Cardiology

## 2019-08-04 ENCOUNTER — Other Ambulatory Visit: Payer: Self-pay

## 2019-08-04 ENCOUNTER — Encounter: Payer: Self-pay | Admitting: *Deleted

## 2019-08-04 VITALS — BP 140/70 | HR 72 | Ht 69.0 in | Wt 235.0 lb

## 2019-08-04 DIAGNOSIS — I10 Essential (primary) hypertension: Secondary | ICD-10-CM

## 2019-08-04 DIAGNOSIS — I2510 Atherosclerotic heart disease of native coronary artery without angina pectoris: Secondary | ICD-10-CM

## 2019-08-04 DIAGNOSIS — Z01810 Encounter for preprocedural cardiovascular examination: Secondary | ICD-10-CM

## 2019-08-04 DIAGNOSIS — I2584 Coronary atherosclerosis due to calcified coronary lesion: Secondary | ICD-10-CM | POA: Diagnosis not present

## 2019-08-04 DIAGNOSIS — Z01818 Encounter for other preprocedural examination: Secondary | ICD-10-CM

## 2019-08-04 NOTE — Patient Instructions (Signed)
Medication Instructions:  The current medical regimen is effective;  continue present plan and medications.  *If you need a refill on your cardiac medications before your next appointment, please call your pharmacy*  Testing/Procedures: Your physician has requested that you have a lexiscan myoview. For further information please visit [email or web address]. Please follow instruction sheet, as given.  Follow-Up: At [facility], you and your health needs are our priority.  As part of our continuing mission to provide you with exceptional heart care, we have created designated Provider Care Teams.  These Care Teams include your primary Cardiologist (physician) and Advanced Practice Providers (APPs -  Physician Assistants and Nurse Practitioners) who all work together to provide you with the care you need, when you need it.  We recommend signing up for the patient portal called "MyChart".  Sign up information is provided on this After Visit Summary.  MyChart is used to connect with patients for Virtual Visits (Telemedicine).  Patients are able to view lab/test results, encounter notes, upcoming appointments, etc.  Non-urgent messages can be sent to your provider as well.   To learn more about what you can do with MyChart, go to [email or web address].    Follow up as needed with Dr Marlou Porch.   Thank you for choosing [facility]!!

## 2019-08-04 NOTE — Progress Notes (Signed)
Cardiology Office Note:    Date:  08/04/2019   ID:  Christopher Patel, DOB 12-12-1945, MRN [id number]  PCP:  Lujean Amel, MD  [facility] Cardiologist:  Candee Furbish, MD  [facility] Electrophysiologist:  None   Referring MD: Lujean Amel, MD     History of Present Illness:    Christopher Patel is a 74 y.o. male with coronary artery calcification diabetes hypertension paroxysmal atrial fibrillation noted back in 2017 with rapid ventricular response on Xarelto, converted on diltiazem here for preoperative orthopedic surgery evaluation.  Previous episode of atrial fibrillation was triggered by heavy alcohol use transiently.  His sons were in town he states and he "partied "too hard with him.  It has been over 3 years since we have last seen him.  He is here today to discuss upcoming knee surgery.  He is able to slowly get up his stairs at his house without any difficulty however does not seem to be able to complete greater than 4 METS of activity.  He has not had any unusual chest discomfort.  Overall he is doing well without any fevers chills nausea vomiting syncope bleeding.    Past Medical History:  Diagnosis Date  . CAD (coronary artery disease)   . Change in voice   . Cough   . Depression   . Diabetes mellitus 04-25-11   oral meds  . ED (erectile dysfunction)   . Hepatic lesion    left lobe  . Hyperlipidemia   . Hypertension    dr Marilynn Rail  . Hypothyroidism   . Lung nodule   . Nasal congestion   . Osteoarthritis 04-25-11   spine area-some neck issues  . Paroxysmal atrial fibrillation (HCC)    started on metoprolol and Xarelto 07/02/2015 for afib RVR  . Pneumonia   . PONV (postoperative nausea and vomiting)   . Thyroid disease    thyroid lesion  . Tubular adenoma    polyps- Dr.Edwards    Past Surgical History:  Procedure Laterality Date  . CARDIAC CATHETERIZATION  04-25-11   many yrs ago-very slight blockage 1 vessel (60% RCA by Dr. Dorthy Cooler notes)    . CHOLECYSTECTOMY N/A 08/07/2018   Procedure: LAPAROSCOPIC CHOLECYSTECTOMY;  Surgeon: Kinsinger, Arta Bruce, MD;  Location: [place];  Service: General;  Laterality: N/A;  . EYE SURGERY  11/2010   cataract x2  . EYE SURGERY  12/2010   len implant x2   . KNEE SURGERY  04-25-11   right/ left knee scope  . THYROIDECTOMY  05/02/2011   Procedure: THYROIDECTOMY;  Surgeon: Earnstine Regal, MD;  Location: [facility];  Service: General;  Laterality: N/A;  Total Thyroidectomy  . TOTAL KNEE ARTHROPLASTY Right 04/25/2013   Procedure: TOTAL KNEE ARTHROPLASTY;  Surgeon: Kerin Salen, MD;  Location: [place];  Service: Orthopedics;  Laterality: Right;    Current Medications: Current Meds  Medication Sig  . acetaminophen (TYLENOL) 500 MG tablet Take 500 mg by mouth every 6 (six) hours as needed for moderate pain (BACK PAIN).  Marland Kitchen amLODipine (NORVASC) 10 MG tablet Take 10 mg by mouth daily.  Marland Kitchen aspirin EC 81 MG tablet Take 81 mg by mouth daily.  . B-D INS SYR ULTRAFINE 1CC/30G 30G X 1/2" 1 ML MISC USE TO INJECT INSULIN ONCE DAILY  . fexofenadine (ALLEGRA) 180 MG tablet Take 180 mg by mouth daily as needed for allergies.   . fluticasone (VERAMYST) 27.5 MCG/SPRAY nasal spray Place 2 sprays into the nose  daily as needed for allergies.   . hydrochlorothiazide (HYDRODIURIL) 25 MG tablet Take 50 mg by mouth daily.   . insulin NPH Human (NOVOLIN N) 100 UNIT/ML injection Inject 0.55 mLs (55 Units total) into the skin daily.  Marland Kitchen levothyroxine (SYNTHROID, LEVOTHROID) 150 MCG tablet Take 150 mcg by mouth daily before breakfast.  . metFORMIN (GLUCOPHAGE) 500 MG tablet Take 1,000 mg by mouth 2 (two) times daily with a meal.  . metoprolol tartrate (LOPRESSOR) 25 MG tablet TAKE 1 TABLET TWICE DAILY  . Multiple Vitamin (MULTIVITAMIN) tablet Take 1 tablet by mouth daily.  Marland Kitchen oxyCODONE (OXY IR/ROXICODONE) 5 MG immediate release tablet Take 1 tablet (5 mg total) by mouth every 6 (six) hours as needed for moderate pain.  . pantoprazole  (PROTONIX) 40 MG tablet Take 40 mg by mouth daily.  . polyethylene glycol (MIRALAX / GLYCOLAX) 17 g packet Take 17 g by mouth daily as needed for mild constipation.     Allergies:   Actos [pioglitazone hydrochloride], Glyburide, Metformin and related, and Codeine   Social History   Socioeconomic History  . Marital status: Widowed    Spouse name: Not on file  . Number of children: Not on file  . Years of education: Not on file  . Highest education level: Not on file  Occupational History  . Not on file  Tobacco Use  . Smoking status: Former Smoker    Years: 50.00    Quit date: 11/24/2009    Years since quitting: 9.6  . Smokeless tobacco: Never Used  Substance and Sexual Activity  . Alcohol use: Yes    Comment: Beer on weekends, 6 or more  . Drug use: No  . Sexual activity: Never  Other Topics Concern  . Not on file  Social History Narrative  . Not on file   Social Determinants of Health   Financial Resource Strain:   . Difficulty of Paying Living Expenses:   Food Insecurity:   . Worried About Charity fundraiser in the Last Year:   . Arboriculturist in the Last Year:   Transportation Needs:   . Film/video editor (Medical):   Marland Kitchen Lack of Transportation (Non-Medical):   Physical Activity:   . Days of Exercise per Week:   . Minutes of Exercise per Session:   Stress:   . Feeling of Stress :   Social Connections:   . Frequency of Communication with Friends and Family:   . Frequency of Social Gatherings with Friends and Family:   . Attends Religious Services:   . Active Member of Clubs or Organizations:   . Attends Archivist Meetings:   Marland Kitchen Marital Status:      Family History: The patient's family history includes COPD in an other family member; Colon cancer in his brother; Diabetes in his brother, mother, and sister; Heart attack in an other family member; Hyperlipidemia in an other family member; Hypertension in an other family member.  ROS:   Please  see the history of present illness.     All other systems reviewed and are negative.  EKGs/Labs/Other Studies Reviewed:      EKG:  EKG is  ordered today.  The ekg ordered today demonstrates sinus rhythm 79 no other abnormalities  Recent Labs: 08/08/2018: ALT 80; BUN 10; Creatinine, Ser 1.07; Hemoglobin 12.5; Platelets 166; Potassium 3.7; Sodium 135  Recent Lipid Panel No results found for: CHOL, TRIG, HDL, CHOLHDL, VLDL, LDLCALC, LDLDIRECT  Physical Exam:  VS:  BP 140/70   Pulse 72   Ht 5\' 9"  (1.753 m)   Wt 235 lb (106.6 kg)   SpO2 93%   BMI 34.70 kg/m     Wt Readings from Last 3 Encounters:  08/04/19 235 lb (106.6 kg)  01/02/19 235 lb 3.2 oz (106.7 kg)  08/07/18 220 lb (99.8 kg)     GEN:  Well nourished, well developed in no acute distress HEENT: Normal NECK: No JVD; No carotid bruits LYMPHATICS: No lymphadenopathy CARDIAC: RRR, no murmurs, rubs, gallops RESPIRATORY:  Clear to auscultation without rales, wheezing or rhonchi  ABDOMEN: Soft, non-tender, non-distended MUSCULOSKELETAL:  No edema; No deformity  SKIN: Warm and dry NEUROLOGIC:  Alert and oriented x 3 PSYCHIATRIC:  Normal affect   ASSESSMENT:    1. Coronary artery calcification   2. Essential hypertension, benign   3. Pre-op evaluation    PLAN:    In order of problems listed above:  Preoperative risk stratification-Dr. Frederik Pear -Left knee replacement planned.  Previously right total knee.  Unable to achieve greater than 4 METS of activity.  We will go ahead and check a pharmacologic stress test.  If low risk, he may proceed with surgery.  His procedure is set up for July 30.  Paroxysmal atrial fibrillation -Previously exacerbated by alcohol use.  Previously his Xarelto was discontinued.  He has not had any further episodes of atrial fibrillation documented.  Obviously if this returns, anticoagulation would be needed.  Coronary artery disease/calcification -Previously described 60% stenosis of  RCA on remote catheterization in records.  He does have left main and two-vessel coronary artery atherosclerosis.  Continue with statin therapy  Medication Adjustments/Labs and Tests Ordered: Current medicines are reviewed at length with the patient today.  Concerns regarding medicines are outlined above.  Orders Placed This Encounter  Procedures  . MYOCARDIAL PERFUSION IMAGING  . EKG 12-Lead   No orders of the defined types were placed in this encounter.   Patient Instructions  Medication Instructions:  The current medical regimen is effective;  continue present plan and medications.  *If you need a refill on your cardiac medications before your next appointment, please call your pharmacy*  Testing/Procedures: Your physician has requested that you have a lexiscan myoview. For further information please visit [email or web address]. Please follow instruction sheet, as given.  Follow-Up: At [facility], you and your health needs are our priority.  As part of our continuing mission to provide you with exceptional heart care, we have created designated Provider Care Teams.  These Care Teams include your primary Cardiologist (physician) and Advanced Practice Providers (APPs -  Physician Assistants and Nurse Practitioners) who all work together to provide you with the care you need, when you need it.  We recommend signing up for the patient portal called "MyChart".  Sign up information is provided on this After Visit Summary.  MyChart is used to connect with patients for Virtual Visits (Telemedicine).  Patients are able to view lab/test results, encounter notes, upcoming appointments, etc.  Non-urgent messages can be sent to your provider as well.   To learn more about what you can do with MyChart, go to [email or web address].    Follow up as needed with Dr Marlou Porch.   Thank you for choosing [facility]!!        Signed, Candee Furbish, MD  08/04/2019 5:32 PM    [place]

## 2019-08-05 ENCOUNTER — Telehealth: Payer: Self-pay | Admitting: *Deleted

## 2019-08-05 NOTE — Telephone Encounter (Signed)
   Primary Cardiologist: Candee Furbish, MD  Chart reviewed as part of pre-operative protocol coverage. Preop status addressed at outpatient visit with Dr. Marlou Porch 08/04/19. Per Dr. Marlou Porch, patient would be at acceptable risk for the planned procedure without further cardiovascular testing.   Okay to hold aspirin 5 days prior to surgery if needed with plans to restart as soon as he is cleared to do so by her surgeon.  I will route this recommendation and the office note from Dr. Marlou Porch to the requesting party via [place] fax function and remove from pre-op pool.  Please call with questions.  Abigail Butts, PA-C 08/05/2019, 10:07 AM

## 2019-08-05 NOTE — Telephone Encounter (Signed)
   [facility] Pre-operative Risk Assessment    HEARTCARE STAFF: - Please ensure there is not already an duplicate clearance open for this procedure. - Under Visit Info/Reason for Call, type in Other and utilize the format Clearance MM/DD/YY or Clearance TBD. Do not use dashes or single digits. - If request is for dental extraction, please clarify the # of teeth to be extracted.  Request for surgical clearance:  1. What type of surgery is being performed? LEFT TOTAL KNEE ARTHROPLASTY   2. When is this surgery scheduled? 08/22/19   3. What type of clearance is required (medical clearance vs. Pharmacy clearance to hold med vs. Both)? MEDICAL  4. Are there any medications that need to be held prior to surgery and how long? ASA    5. Practice name and name of physician performing surgery? [facility]; DR. FRANK ROWAN   6. What is the office phone number? [phone]   7.   What is the office fax number? [phone]  8.   Anesthesia type (None, local, MAC, general) ? SPINAL   Julaine Hua 08/05/2019, 9:52 AM  _________________________________________________________________   (provider comments below)

## 2019-08-06 ENCOUNTER — Other Ambulatory Visit: Payer: Self-pay | Admitting: Endocrinology

## 2019-08-06 ENCOUNTER — Telehealth (HOSPITAL_COMMUNITY): Payer: Self-pay | Admitting: *Deleted

## 2019-08-06 DIAGNOSIS — Z794 Long term (current) use of insulin: Secondary | ICD-10-CM

## 2019-08-06 NOTE — Telephone Encounter (Signed)
Patient given detailed instructions per Myocardial Perfusion Study Information Sheet for the test on 08/11/19 at 10:00. Patient notified to arrive 15 minutes early and that it is imperative to arrive on time for appointment to keep from having the test rescheduled.  If you need to cancel or reschedule your appointment, please call the office within 24 hours of your appointment. . Patient verbalized understanding. Christopher Patel

## 2019-08-07 DIAGNOSIS — E1165 Type 2 diabetes mellitus with hyperglycemia: Secondary | ICD-10-CM | POA: Diagnosis not present

## 2019-08-07 DIAGNOSIS — Z794 Long term (current) use of insulin: Secondary | ICD-10-CM | POA: Diagnosis not present

## 2019-08-07 DIAGNOSIS — I480 Paroxysmal atrial fibrillation: Secondary | ICD-10-CM | POA: Diagnosis not present

## 2019-08-07 DIAGNOSIS — I10 Essential (primary) hypertension: Secondary | ICD-10-CM | POA: Diagnosis not present

## 2019-08-11 ENCOUNTER — Other Ambulatory Visit: Payer: Self-pay

## 2019-08-11 ENCOUNTER — Ambulatory Visit (HOSPITAL_COMMUNITY): Payer: Medicare HMO | Attending: Cardiology

## 2019-08-11 ENCOUNTER — Encounter (HOSPITAL_COMMUNITY): Payer: Medicare HMO

## 2019-08-11 DIAGNOSIS — Z01818 Encounter for other preprocedural examination: Secondary | ICD-10-CM | POA: Insufficient documentation

## 2019-08-11 DIAGNOSIS — I2584 Coronary atherosclerosis due to calcified coronary lesion: Secondary | ICD-10-CM | POA: Diagnosis not present

## 2019-08-11 DIAGNOSIS — Z01810 Encounter for preprocedural cardiovascular examination: Secondary | ICD-10-CM | POA: Diagnosis not present

## 2019-08-11 DIAGNOSIS — I10 Essential (primary) hypertension: Secondary | ICD-10-CM | POA: Insufficient documentation

## 2019-08-11 DIAGNOSIS — I2510 Atherosclerotic heart disease of native coronary artery without angina pectoris: Secondary | ICD-10-CM | POA: Insufficient documentation

## 2019-08-11 LAB — MYOCARDIAL PERFUSION IMAGING
LV dias vol: 90 mL (ref 62–150)
LV sys vol: 32 mL
Peak HR: 92 {beats}/min
Rest HR: 64 {beats}/min
SDS: 0
SRS: 0
SSS: 0
TID: 1.02

## 2019-08-11 MED ORDER — TECHNETIUM TC 99M TETROFOSMIN IV KIT
31.9000 | PACK | Freq: Once | INTRAVENOUS | Status: AC | PRN
  Administered 2019-08-11: 31.9 via INTRAVENOUS
  Filled 2019-08-11: qty 32

## 2019-08-11 MED ORDER — TECHNETIUM TC 99M TETROFOSMIN IV KIT
10.7000 | PACK | Freq: Once | INTRAVENOUS | Status: AC | PRN
  Administered 2019-08-11: 10.7 via INTRAVENOUS
  Filled 2019-08-11: qty 11

## 2019-08-11 MED ORDER — REGADENOSON 0.4 MG/5ML IV SOLN
0.4000 mg | Freq: Once | INTRAVENOUS | Status: AC
  Administered 2019-08-11: 0.4 mg via INTRAVENOUS

## 2019-08-14 DIAGNOSIS — M1712 Unilateral primary osteoarthritis, left knee: Secondary | ICD-10-CM | POA: Diagnosis not present

## 2019-08-16 DIAGNOSIS — G4733 Obstructive sleep apnea (adult) (pediatric): Secondary | ICD-10-CM | POA: Diagnosis not present

## 2019-08-19 ENCOUNTER — Other Ambulatory Visit: Payer: Self-pay

## 2019-08-19 ENCOUNTER — Encounter: Payer: Self-pay | Admitting: Endocrinology

## 2019-08-19 ENCOUNTER — Ambulatory Visit: Payer: Medicare HMO | Admitting: Endocrinology

## 2019-08-19 VITALS — BP 112/64 | HR 70 | Ht 69.0 in | Wt 236.6 lb

## 2019-08-19 DIAGNOSIS — E119 Type 2 diabetes mellitus without complications: Secondary | ICD-10-CM

## 2019-08-19 DIAGNOSIS — Z794 Long term (current) use of insulin: Secondary | ICD-10-CM | POA: Diagnosis not present

## 2019-08-19 LAB — POCT GLYCOSYLATED HEMOGLOBIN (HGB A1C): Hemoglobin A1C: 7.1 % — AB (ref 4.0–5.6)

## 2019-08-19 MED ORDER — INSULIN NPH (HUMAN) (ISOPHANE) 100 UNIT/ML ~~LOC~~ SUSP: 55.0000 [IU] | SUBCUTANEOUS | 1 refills | Status: DC

## 2019-08-19 NOTE — Patient Instructions (Addendum)
Please continue the same insulin.   check your blood sugar twice a day.  vary the time of day when you check, between before the 3 meals, and at bedtime.  also check if you have symptoms of your blood sugar being too high or too low.  please keep a record of the readings and bring it to your next appointment here (or you can bring the meter itself).  You can write it on any piece of paper.  please call us sooner if your blood sugar goes below 70, or if you have a lot of readings over 200.   Please come back for a follow-up appointment in 4 months.    

## 2019-08-19 NOTE — Progress Notes (Signed)
Subjective:    Patient ID: Christopher Patel, male    DOB: 04-19-1945, 74 y.o.   MRN: [id number]  HPI Pt returns for f/u of diabetes mellitus: DM type: Insulin-requiring type 2 Dx'ed: 1025 Complications: none Therapy: insulin since 2017, and metformin.   DKA: never.  Severe hypoglycemia: never.   Pancreatitis: never.  Other: he takes human insulin, due to cost; he declines multiple daily injections.   Interval history: pt states cbg's vary from 110-158.  pt states he feels well in general.  no recent steroids.  He needs to be cleared for left TKR.   Past Medical History:  Diagnosis Date  . CAD (coronary artery disease)   . Change in voice   . Cough   . Depression   . Diabetes mellitus 04-25-11   oral meds  . ED (erectile dysfunction)   . Hepatic lesion    left lobe  . Hyperlipidemia   . Hypertension    dr Marilynn Rail  . Hypothyroidism   . Lung nodule   . Nasal congestion   . Osteoarthritis 04-25-11   spine area-some neck issues  . Paroxysmal atrial fibrillation (HCC)    started on metoprolol and Xarelto 07/02/2015 for afib RVR  . Pneumonia   . PONV (postoperative nausea and vomiting)   . Thyroid disease    thyroid lesion  . Tubular adenoma    polyps- Dr.Edwards    Past Surgical History:  Procedure Laterality Date  . CARDIAC CATHETERIZATION  04-25-11   many yrs ago-very slight blockage 1 vessel (60% RCA by Dr. Dorthy Cooler notes)  . CHOLECYSTECTOMY N/A 08/07/2018   Procedure: LAPAROSCOPIC CHOLECYSTECTOMY;  Surgeon: Kinsinger, Arta Bruce, MD;  Location: [place];  Service: General;  Laterality: N/A;  . EYE SURGERY  11/2010   cataract x2  . EYE SURGERY  12/2010   len implant x2   . KNEE SURGERY  04-25-11   right/ left knee scope  . THYROIDECTOMY  05/02/2011   Procedure: THYROIDECTOMY;  Surgeon: Earnstine Regal, MD;  Location: [facility];  Service: General;  Laterality: N/A;  Total Thyroidectomy  . TOTAL KNEE ARTHROPLASTY Right 04/25/2013   Procedure: TOTAL KNEE ARTHROPLASTY;   Surgeon: Kerin Salen, MD;  Location: [place];  Service: Orthopedics;  Laterality: Right;    Social History   Socioeconomic History  . Marital status: Widowed    Spouse name: Not on file  . Number of children: Not on file  . Years of education: Not on file  . Highest education level: Not on file  Occupational History  . Not on file  Tobacco Use  . Smoking status: Former Smoker    Years: 50.00    Quit date: 11/24/2009    Years since quitting: 9.7  . Smokeless tobacco: Never Used  Substance and Sexual Activity  . Alcohol use: Yes    Comment: Beer on weekends, 6 or more  . Drug use: No  . Sexual activity: Never  Other Topics Concern  . Not on file  Social History Narrative  . Not on file   Social Determinants of Health   Financial Resource Strain:   . Difficulty of Paying Living Expenses:   Food Insecurity:   . Worried About Charity fundraiser in the Last Year:   . Arboriculturist in the Last Year:   Transportation Needs:   . Film/video editor (Medical):   Marland Kitchen Lack of Transportation (Non-Medical):   Physical Activity:   . Days  of Exercise per Week:   . Minutes of Exercise per Session:   Stress:   . Feeling of Stress :   Social Connections:   . Frequency of Communication with Friends and Family:   . Frequency of Social Gatherings with Friends and Family:   . Attends Religious Services:   . Active Member of Clubs or Organizations:   . Attends Archivist Meetings:   Marland Kitchen Marital Status:   Intimate Partner Violence:   . Fear of Current or Ex-Partner:   . Emotionally Abused:   Marland Kitchen Physically Abused:   . Sexually Abused:     Current Outpatient Medications on File Prior to Visit  Medication Sig Dispense Refill  . acetaminophen (TYLENOL) 500 MG tablet Take 500 mg by mouth every 6 (six) hours as needed for moderate pain (BACK PAIN).    Marland Kitchen amLODipine (NORVASC) 10 MG tablet Take 10 mg by mouth daily.    Marland Kitchen aspirin EC 81 MG tablet Take 81 mg by mouth daily.    .  B-D INS SYR ULTRAFINE 1CC/30G 30G X 1/2" 1 ML MISC USE TO INJECT INSULIN ONCE DAILY 90 each 2  . fexofenadine (ALLEGRA) 180 MG tablet Take 180 mg by mouth daily as needed for allergies.     . fluticasone (VERAMYST) 27.5 MCG/SPRAY nasal spray Place 2 sprays into the nose daily as needed for allergies.     . hydrochlorothiazide (HYDRODIURIL) 25 MG tablet Take 50 mg by mouth daily.     Marland Kitchen levothyroxine (SYNTHROID, LEVOTHROID) 150 MCG tablet Take 150 mcg by mouth daily before breakfast.    . metFORMIN (GLUCOPHAGE) 500 MG tablet Take 1,000 mg by mouth 2 (two) times daily with a meal.    . metoprolol tartrate (LOPRESSOR) 25 MG tablet TAKE 1 TABLET TWICE DAILY 180 tablet 3  . Multiple Vitamin (MULTIVITAMIN) tablet Take 1 tablet by mouth daily.    Marland Kitchen oxyCODONE (OXY IR/ROXICODONE) 5 MG immediate release tablet Take 1 tablet (5 mg total) by mouth every 6 (six) hours as needed for moderate pain. 15 tablet 0  . pantoprazole (PROTONIX) 40 MG tablet Take 40 mg by mouth daily.    . polyethylene glycol (MIRALAX / GLYCOLAX) 17 g packet Take 17 g by mouth daily as needed for mild constipation. 14 each 0   No current facility-administered medications on file prior to visit.    Allergies  Allergen Reactions  . Actos [Pioglitazone Hydrochloride] Nausea Only  . Glyburide Other (See Comments)    hypoglycemia  . Metformin And Related Other (See Comments)    Hypoglycemia, shakes Fast acting metformin  . Codeine Itching    Family History  Problem Relation Age of Onset  . Diabetes Mother   . Diabetes Brother   . Colon cancer Brother   . COPD Other   . Hypertension Other   . Hyperlipidemia Other   . Heart attack Other   . Diabetes Sister     BP (!) 112/64   Pulse 70   Ht 5\' 9"  (1.753 m)   Wt (!) 236 lb 9.6 oz (107.3 kg)   SpO2 94%   BMI 34.94 kg/m    Review of Systems He denies hypoglycemia.      Objective:   Physical Exam VITAL SIGNS:  See vs page GENERAL: no distress Pulses: dorsalis pedis  intact bilat.   MSK: no deformity of the feet CV: 1+ bilat leg edema Skin:  no ulcer on the feet.  normal color and temp on the feet.  Neuro: sensation is intact to touch on the feet.     Lab Results  Component Value Date   HGBA1C 7.1 (A) 08/19/2019        Assessment & Plan:  Insulin-requiring type 2 DM: well-controlled. His surgical risk is low and outweighed by the potential benefit of the surgery.  he is therefore medically cleared. This clearance is from the standpoint of the diabetes only  Patient Instructions  Please continue the same insulin.   check your blood sugar twice a day.  vary the time of day when you check, between before the 3 meals, and at bedtime.  also check if you have symptoms of your blood sugar being too high or too low.  please keep a record of the readings and bring it to your next appointment here (or you can bring the meter itself).  You can write it on any piece of paper.  please call us sooner if your blood sugar goes below 70, or if you have a lot of readings over 200.   Please come back for a follow-up appointment in 4 months.

## 2019-08-20 ENCOUNTER — Telehealth: Payer: Self-pay

## 2019-08-20 DIAGNOSIS — Z01818 Encounter for other preprocedural examination: Secondary | ICD-10-CM | POA: Diagnosis not present

## 2019-08-20 DIAGNOSIS — Z6841 Body Mass Index (BMI) 40.0 and over, adult: Secondary | ICD-10-CM | POA: Diagnosis not present

## 2019-08-20 DIAGNOSIS — E039 Hypothyroidism, unspecified: Secondary | ICD-10-CM | POA: Diagnosis not present

## 2019-08-20 DIAGNOSIS — E662 Morbid (severe) obesity with alveolar hypoventilation: Secondary | ICD-10-CM | POA: Diagnosis not present

## 2019-08-20 DIAGNOSIS — I10 Essential (primary) hypertension: Secondary | ICD-10-CM | POA: Diagnosis not present

## 2019-08-20 DIAGNOSIS — E1165 Type 2 diabetes mellitus with hyperglycemia: Secondary | ICD-10-CM | POA: Diagnosis not present

## 2019-08-20 DIAGNOSIS — Z794 Long term (current) use of insulin: Secondary | ICD-10-CM | POA: Diagnosis not present

## 2019-08-20 NOTE — Telephone Encounter (Signed)
FAXED [place]: Surgical Clearance  All above requested information has been faxed successfully to [employer] listed above. Documents and fax confirmation have been placed in the faxed file for future reference.

## 2019-08-21 DIAGNOSIS — N401 Enlarged prostate with lower urinary tract symptoms: Secondary | ICD-10-CM | POA: Diagnosis not present

## 2019-08-21 DIAGNOSIS — I10 Essential (primary) hypertension: Secondary | ICD-10-CM | POA: Diagnosis not present

## 2019-08-21 DIAGNOSIS — G4700 Insomnia, unspecified: Secondary | ICD-10-CM | POA: Diagnosis not present

## 2019-08-21 DIAGNOSIS — E1165 Type 2 diabetes mellitus with hyperglycemia: Secondary | ICD-10-CM | POA: Diagnosis not present

## 2019-08-21 DIAGNOSIS — E039 Hypothyroidism, unspecified: Secondary | ICD-10-CM | POA: Diagnosis not present

## 2019-08-21 DIAGNOSIS — I2510 Atherosclerotic heart disease of native coronary artery without angina pectoris: Secondary | ICD-10-CM | POA: Diagnosis not present

## 2019-08-21 DIAGNOSIS — E7800 Pure hypercholesterolemia, unspecified: Secondary | ICD-10-CM | POA: Diagnosis not present

## 2019-08-21 DIAGNOSIS — I480 Paroxysmal atrial fibrillation: Secondary | ICD-10-CM | POA: Diagnosis not present

## 2019-08-22 DIAGNOSIS — M1712 Unilateral primary osteoarthritis, left knee: Secondary | ICD-10-CM | POA: Diagnosis not present

## 2019-08-22 DIAGNOSIS — G8918 Other acute postprocedural pain: Secondary | ICD-10-CM | POA: Diagnosis not present

## 2019-08-22 DIAGNOSIS — M21162 Varus deformity, not elsewhere classified, left knee: Secondary | ICD-10-CM | POA: Diagnosis not present

## 2019-08-25 DIAGNOSIS — Z96651 Presence of right artificial knee joint: Secondary | ICD-10-CM | POA: Diagnosis not present

## 2019-08-25 DIAGNOSIS — M6281 Muscle weakness (generalized): Secondary | ICD-10-CM | POA: Diagnosis not present

## 2019-08-25 DIAGNOSIS — M25662 Stiffness of left knee, not elsewhere classified: Secondary | ICD-10-CM | POA: Diagnosis not present

## 2019-08-27 ENCOUNTER — Ambulatory Visit: Payer: Medicare HMO | Admitting: Endocrinology

## 2019-08-29 DIAGNOSIS — M6281 Muscle weakness (generalized): Secondary | ICD-10-CM | POA: Diagnosis not present

## 2019-08-29 DIAGNOSIS — M25662 Stiffness of left knee, not elsewhere classified: Secondary | ICD-10-CM | POA: Diagnosis not present

## 2019-08-29 DIAGNOSIS — Z96652 Presence of left artificial knee joint: Secondary | ICD-10-CM | POA: Diagnosis not present

## 2019-09-02 DIAGNOSIS — Z96652 Presence of left artificial knee joint: Secondary | ICD-10-CM | POA: Diagnosis not present

## 2019-09-02 DIAGNOSIS — M1712 Unilateral primary osteoarthritis, left knee: Secondary | ICD-10-CM | POA: Diagnosis not present

## 2019-09-03 DIAGNOSIS — M25662 Stiffness of left knee, not elsewhere classified: Secondary | ICD-10-CM | POA: Diagnosis not present

## 2019-09-03 DIAGNOSIS — M6281 Muscle weakness (generalized): Secondary | ICD-10-CM | POA: Diagnosis not present

## 2019-09-03 DIAGNOSIS — Z96652 Presence of left artificial knee joint: Secondary | ICD-10-CM | POA: Diagnosis not present

## 2019-09-05 DIAGNOSIS — M6281 Muscle weakness (generalized): Secondary | ICD-10-CM | POA: Diagnosis not present

## 2019-09-05 DIAGNOSIS — M25662 Stiffness of left knee, not elsewhere classified: Secondary | ICD-10-CM | POA: Diagnosis not present

## 2019-09-05 DIAGNOSIS — Z96652 Presence of left artificial knee joint: Secondary | ICD-10-CM | POA: Diagnosis not present

## 2019-09-08 DIAGNOSIS — M6281 Muscle weakness (generalized): Secondary | ICD-10-CM | POA: Diagnosis not present

## 2019-09-08 DIAGNOSIS — Z96652 Presence of left artificial knee joint: Secondary | ICD-10-CM | POA: Diagnosis not present

## 2019-09-08 DIAGNOSIS — M25662 Stiffness of left knee, not elsewhere classified: Secondary | ICD-10-CM | POA: Diagnosis not present

## 2019-09-16 DIAGNOSIS — G4733 Obstructive sleep apnea (adult) (pediatric): Secondary | ICD-10-CM | POA: Diagnosis not present

## 2019-09-17 DIAGNOSIS — Z96652 Presence of left artificial knee joint: Secondary | ICD-10-CM | POA: Diagnosis not present

## 2019-09-17 DIAGNOSIS — M6281 Muscle weakness (generalized): Secondary | ICD-10-CM | POA: Diagnosis not present

## 2019-09-17 DIAGNOSIS — M25662 Stiffness of left knee, not elsewhere classified: Secondary | ICD-10-CM | POA: Diagnosis not present

## 2019-09-23 DIAGNOSIS — M6281 Muscle weakness (generalized): Secondary | ICD-10-CM | POA: Diagnosis not present

## 2019-09-23 DIAGNOSIS — M25662 Stiffness of left knee, not elsewhere classified: Secondary | ICD-10-CM | POA: Diagnosis not present

## 2019-09-23 DIAGNOSIS — Z96652 Presence of left artificial knee joint: Secondary | ICD-10-CM | POA: Diagnosis not present

## 2019-10-13 DIAGNOSIS — E039 Hypothyroidism, unspecified: Secondary | ICD-10-CM | POA: Diagnosis not present

## 2019-10-13 DIAGNOSIS — N401 Enlarged prostate with lower urinary tract symptoms: Secondary | ICD-10-CM | POA: Diagnosis not present

## 2019-10-13 DIAGNOSIS — I480 Paroxysmal atrial fibrillation: Secondary | ICD-10-CM | POA: Diagnosis not present

## 2019-10-13 DIAGNOSIS — E1165 Type 2 diabetes mellitus with hyperglycemia: Secondary | ICD-10-CM | POA: Diagnosis not present

## 2019-10-13 DIAGNOSIS — G4700 Insomnia, unspecified: Secondary | ICD-10-CM | POA: Diagnosis not present

## 2019-10-13 DIAGNOSIS — E7800 Pure hypercholesterolemia, unspecified: Secondary | ICD-10-CM | POA: Diagnosis not present

## 2019-10-13 DIAGNOSIS — I2510 Atherosclerotic heart disease of native coronary artery without angina pectoris: Secondary | ICD-10-CM | POA: Diagnosis not present

## 2019-10-13 DIAGNOSIS — I10 Essential (primary) hypertension: Secondary | ICD-10-CM | POA: Diagnosis not present

## 2019-10-16 DIAGNOSIS — L989 Disorder of the skin and subcutaneous tissue, unspecified: Secondary | ICD-10-CM | POA: Diagnosis not present

## 2019-10-16 DIAGNOSIS — Z23 Encounter for immunization: Secondary | ICD-10-CM | POA: Diagnosis not present

## 2019-11-10 DIAGNOSIS — H5210 Myopia, unspecified eye: Secondary | ICD-10-CM | POA: Diagnosis not present

## 2019-12-09 DIAGNOSIS — G4733 Obstructive sleep apnea (adult) (pediatric): Secondary | ICD-10-CM | POA: Diagnosis not present

## 2019-12-12 ENCOUNTER — Ambulatory Visit: Payer: Medicare HMO | Admitting: Endocrinology

## 2019-12-12 ENCOUNTER — Other Ambulatory Visit: Payer: Self-pay

## 2019-12-12 ENCOUNTER — Encounter: Payer: Self-pay | Admitting: Endocrinology

## 2019-12-12 VITALS — BP 138/60 | HR 68 | Ht 69.0 in | Wt 238.0 lb

## 2019-12-12 DIAGNOSIS — Z794 Long term (current) use of insulin: Secondary | ICD-10-CM | POA: Diagnosis not present

## 2019-12-12 DIAGNOSIS — E119 Type 2 diabetes mellitus without complications: Secondary | ICD-10-CM

## 2019-12-12 LAB — POCT GLYCOSYLATED HEMOGLOBIN (HGB A1C): Hemoglobin A1C: 7.4 % — AB (ref 4.0–5.6)

## 2019-12-12 NOTE — Progress Notes (Signed)
Subjective:    Patient ID: Christopher Patel, male    DOB: 1945/02/07, 74 y.o.   MRN: [id number]  HPI Pt returns for f/u of diabetes mellitus: DM type: Insulin-requiring type 2 Dx'ed: 7353 Complications: none Therapy: insulin since 2017, and metformin.   DKA: never.  Severe hypoglycemia: never.   Pancreatitis: never.  Other: he takes human insulin, due to cost; he declines multiple daily injections.   Interval history: pt states cbg's are in the 100's.  pt states he feels well in general.  no recent steroids.   Past Medical History:  Diagnosis Date  . CAD (coronary artery disease)   . Change in voice   . Cough   . Depression   . Diabetes mellitus 04-25-11   oral meds  . ED (erectile dysfunction)   . Hepatic lesion    left lobe  . Hyperlipidemia   . Hypertension    dr Marilynn Rail  . Hypothyroidism   . Lung nodule   . Nasal congestion   . Osteoarthritis 04-25-11   spine area-some neck issues  . Paroxysmal atrial fibrillation (HCC)    started on metoprolol and Xarelto 07/02/2015 for afib RVR  . Pneumonia   . PONV (postoperative nausea and vomiting)   . Thyroid disease    thyroid lesion  . Tubular adenoma    polyps- Dr.Edwards    Past Surgical History:  Procedure Laterality Date  . CARDIAC CATHETERIZATION  04-25-11   many yrs ago-very slight blockage 1 vessel (60% RCA by Dr. Dorthy Cooler notes)  . CHOLECYSTECTOMY N/A 08/07/2018   Procedure: LAPAROSCOPIC CHOLECYSTECTOMY;  Surgeon: Kinsinger, Arta Bruce, MD;  Location: [place];  Service: General;  Laterality: N/A;  . EYE SURGERY  11/2010   cataract x2  . EYE SURGERY  12/2010   len implant x2   . KNEE SURGERY  04-25-11   right/ left knee scope  . THYROIDECTOMY  05/02/2011   Procedure: THYROIDECTOMY;  Surgeon: Earnstine Regal, MD;  Location: [facility];  Service: General;  Laterality: N/A;  Total Thyroidectomy  . TOTAL KNEE ARTHROPLASTY Right 04/25/2013   Procedure: TOTAL KNEE ARTHROPLASTY;  Surgeon: Kerin Salen, MD;  Location: [place];   Service: Orthopedics;  Laterality: Right;    Social History   Socioeconomic History  . Marital status: Widowed    Spouse name: Not on file  . Number of children: Not on file  . Years of education: Not on file  . Highest education level: Not on file  Occupational History  . Not on file  Tobacco Use  . Smoking status: Former Smoker    Years: 50.00    Quit date: 11/24/2009    Years since quitting: 10.0  . Smokeless tobacco: Never Used  Substance and Sexual Activity  . Alcohol use: Yes    Comment: Beer on weekends, 6 or more  . Drug use: No  . Sexual activity: Never  Other Topics Concern  . Not on file  Social History Narrative  . Not on file   Social Determinants of Health   Financial Resource Strain:   . Difficulty of Paying Living Expenses: Not on file  Food Insecurity:   . Worried About Charity fundraiser in the Last Year: Not on file  . Ran Out of Food in the Last Year: Not on file  Transportation Needs:   . Lack of Transportation (Medical): Not on file  . Lack of Transportation (Non-Medical): Not on file  Physical Activity:   .  Days of Exercise per Week: Not on file  . Minutes of Exercise per Session: Not on file  Stress:   . Feeling of Stress : Not on file  Social Connections:   . Frequency of Communication with Friends and Family: Not on file  . Frequency of Social Gatherings with Friends and Family: Not on file  . Attends Religious Services: Not on file  . Active Member of Clubs or Organizations: Not on file  . Attends Archivist Meetings: Not on file  . Marital Status: Not on file  Intimate Partner Violence:   . Fear of Current or Ex-Partner: Not on file  . Emotionally Abused: Not on file  . Physically Abused: Not on file  . Sexually Abused: Not on file    Current Outpatient Medications on File Prior to Visit  Medication Sig Dispense Refill  . acetaminophen (TYLENOL) 500 MG tablet Take 500 mg by mouth every 6 (six) hours as needed for  moderate pain (BACK PAIN).    Marland Kitchen amLODipine (NORVASC) 10 MG tablet Take 10 mg by mouth daily.    Marland Kitchen aspirin EC 81 MG tablet Take 81 mg by mouth daily.    . B-D INS SYR ULTRAFINE 1CC/30G 30G X 1/2" 1 ML MISC USE TO INJECT INSULIN ONCE DAILY 90 each 2  . fexofenadine (ALLEGRA) 180 MG tablet Take 180 mg by mouth daily as needed for allergies.     . fluticasone (VERAMYST) 27.5 MCG/SPRAY nasal spray Place 2 sprays into the nose daily as needed for allergies.     . hydrochlorothiazide (HYDRODIURIL) 25 MG tablet Take 50 mg by mouth daily.     . insulin NPH Human (NOVOLIN N) 100 UNIT/ML injection Inject 0.55 mLs (55 Units total) into the skin every morning. 50 mL 1  . levothyroxine (SYNTHROID, LEVOTHROID) 150 MCG tablet Take 150 mcg by mouth daily before breakfast.    . metFORMIN (GLUCOPHAGE) 500 MG tablet Take 1,000 mg by mouth 2 (two) times daily with a meal.    . metoprolol tartrate (LOPRESSOR) 25 MG tablet TAKE 1 TABLET TWICE DAILY 180 tablet 3  . Multiple Vitamin (MULTIVITAMIN) tablet Take 1 tablet by mouth daily.    Marland Kitchen oxyCODONE (OXY IR/ROXICODONE) 5 MG immediate release tablet Take 1 tablet (5 mg total) by mouth every 6 (six) hours as needed for moderate pain. 15 tablet 0  . pantoprazole (PROTONIX) 40 MG tablet Take 40 mg by mouth daily.    . polyethylene glycol (MIRALAX / GLYCOLAX) 17 g packet Take 17 g by mouth daily as needed for mild constipation. 14 each 0   No current facility-administered medications on file prior to visit.    Allergies  Allergen Reactions  . Actos [Pioglitazone Hydrochloride] Nausea Only  . Glyburide Other (See Comments)    hypoglycemia  . Metformin And Related Other (See Comments)    Hypoglycemia, shakes Fast acting metformin  . Codeine Itching    Family History  Problem Relation Age of Onset  . Diabetes Mother   . Diabetes Brother   . Colon cancer Brother   . COPD Other   . Hypertension Other   . Hyperlipidemia Other   . Heart attack Other   . Diabetes  Sister     BP 138/60   Pulse 68   Ht 5\' 9"  (1.753 m)   Wt 238 lb (108 kg)   SpO2 94%   BMI 35.15 kg/m    Review of Systems He denies hypoglycemia    Objective:  Physical Exam VITAL SIGNS:  See vs page GENERAL: no distress Pulses: dorsalis pedis intact bilat.   MSK: no deformity of the feet CV: 2+ bilat leg edema Skin:  no ulcer on the feet.  normal color and temp on the feet.  Neuro: sensation is intact to touch on the feet.     Lab Results  Component Value Date   HGBA1C 7.4 (A) 12/12/2019       Assessment & Plan:  Insulin-requiring type 2 DM: this is the best control this pt should aim for, given this regimen, which does match insulin to his changing needs throughout the day  Patient Instructions  Please continue the same insulin.   check your blood sugar twice a day.  vary the time of day when you check, between before the 3 meals, and at bedtime.  also check if you have symptoms of your blood sugar being too high or too low.  please keep a record of the readings and bring it to your next appointment here (or you can bring the meter itself).  You can write it on any piece of paper.  please call us sooner if your blood sugar goes below 70, or if you have a lot of readings over 200.   Please come back for a follow-up appointment in 4 months.

## 2019-12-12 NOTE — Patient Instructions (Signed)
Please continue the same insulin.   check your blood sugar twice a day.  vary the time of day when you check, between before the 3 meals, and at bedtime.  also check if you have symptoms of your blood sugar being too high or too low.  please keep a record of the readings and bring it to your next appointment here (or you can bring the meter itself).  You can write it on any piece of paper.  please call us sooner if your blood sugar goes below 70, or if you have a lot of readings over 200.   Please come back for a follow-up appointment in 4 months.

## 2020-01-05 DIAGNOSIS — G4733 Obstructive sleep apnea (adult) (pediatric): Secondary | ICD-10-CM | POA: Diagnosis not present

## 2020-01-22 ENCOUNTER — Other Ambulatory Visit: Payer: Self-pay | Admitting: Endocrinology

## 2020-01-22 DIAGNOSIS — Z794 Long term (current) use of insulin: Secondary | ICD-10-CM

## 2020-02-25 DIAGNOSIS — Z79899 Other long term (current) drug therapy: Secondary | ICD-10-CM | POA: Diagnosis not present

## 2020-02-25 DIAGNOSIS — I2510 Atherosclerotic heart disease of native coronary artery without angina pectoris: Secondary | ICD-10-CM | POA: Diagnosis not present

## 2020-02-25 DIAGNOSIS — G4700 Insomnia, unspecified: Secondary | ICD-10-CM | POA: Diagnosis not present

## 2020-02-25 DIAGNOSIS — E7800 Pure hypercholesterolemia, unspecified: Secondary | ICD-10-CM | POA: Diagnosis not present

## 2020-02-25 DIAGNOSIS — Z794 Long term (current) use of insulin: Secondary | ICD-10-CM | POA: Diagnosis not present

## 2020-02-25 DIAGNOSIS — E039 Hypothyroidism, unspecified: Secondary | ICD-10-CM | POA: Diagnosis not present

## 2020-02-25 DIAGNOSIS — E1169 Type 2 diabetes mellitus with other specified complication: Secondary | ICD-10-CM | POA: Diagnosis not present

## 2020-02-25 DIAGNOSIS — Z0001 Encounter for general adult medical examination with abnormal findings: Secondary | ICD-10-CM | POA: Diagnosis not present

## 2020-02-25 DIAGNOSIS — I480 Paroxysmal atrial fibrillation: Secondary | ICD-10-CM | POA: Diagnosis not present

## 2020-02-25 DIAGNOSIS — I10 Essential (primary) hypertension: Secondary | ICD-10-CM | POA: Diagnosis not present

## 2020-03-10 DIAGNOSIS — E039 Hypothyroidism, unspecified: Secondary | ICD-10-CM | POA: Diagnosis not present

## 2020-03-10 DIAGNOSIS — Z79899 Other long term (current) drug therapy: Secondary | ICD-10-CM | POA: Diagnosis not present

## 2020-03-22 DIAGNOSIS — N401 Enlarged prostate with lower urinary tract symptoms: Secondary | ICD-10-CM | POA: Diagnosis not present

## 2020-03-22 DIAGNOSIS — I10 Essential (primary) hypertension: Secondary | ICD-10-CM | POA: Diagnosis not present

## 2020-03-22 DIAGNOSIS — I480 Paroxysmal atrial fibrillation: Secondary | ICD-10-CM | POA: Diagnosis not present

## 2020-03-22 DIAGNOSIS — E7800 Pure hypercholesterolemia, unspecified: Secondary | ICD-10-CM | POA: Diagnosis not present

## 2020-03-22 DIAGNOSIS — E1169 Type 2 diabetes mellitus with other specified complication: Secondary | ICD-10-CM | POA: Diagnosis not present

## 2020-03-22 DIAGNOSIS — E039 Hypothyroidism, unspecified: Secondary | ICD-10-CM | POA: Diagnosis not present

## 2020-03-22 DIAGNOSIS — I2510 Atherosclerotic heart disease of native coronary artery without angina pectoris: Secondary | ICD-10-CM | POA: Diagnosis not present

## 2020-03-22 DIAGNOSIS — G4700 Insomnia, unspecified: Secondary | ICD-10-CM | POA: Diagnosis not present

## 2020-03-22 DIAGNOSIS — E1165 Type 2 diabetes mellitus with hyperglycemia: Secondary | ICD-10-CM | POA: Diagnosis not present

## 2020-04-16 ENCOUNTER — Other Ambulatory Visit: Payer: Self-pay

## 2020-04-16 ENCOUNTER — Ambulatory Visit: Payer: Medicare HMO | Admitting: Endocrinology

## 2020-04-16 VITALS — BP 160/70 | HR 65 | Ht 70.0 in | Wt 233.2 lb

## 2020-04-16 DIAGNOSIS — E119 Type 2 diabetes mellitus without complications: Secondary | ICD-10-CM

## 2020-04-16 DIAGNOSIS — Z794 Long term (current) use of insulin: Secondary | ICD-10-CM | POA: Diagnosis not present

## 2020-04-16 LAB — POCT GLYCOSYLATED HEMOGLOBIN (HGB A1C): Hemoglobin A1C: 7.6 % — AB (ref 4.0–5.6)

## 2020-04-16 NOTE — Patient Instructions (Addendum)
Your blood pressure is high today.  Please see your primary care provider soon, to have it rechecked.  Please continue the same insulin.   check your blood sugar twice a day.  vary the time of day when you check, between before the 3 meals, and at bedtime.  also check if you have symptoms of your blood sugar being too high or too low.  please keep a record of the readings and bring it to your next appointment here (or you can bring the meter itself).  You can write it on any piece of paper.  please call us sooner if your blood sugar goes below 70, or if you have a lot of readings over 200.   Please come back for a follow-up appointment in 4 months.

## 2020-04-16 NOTE — Progress Notes (Signed)
Subjective:    Patient ID: Christopher Patel, male    DOB: 09-26-1945, 75 y.o.   MRN: [id number]  HPI Pt returns for f/u of diabetes mellitus:  DM type: Insulin-requiring type 2 Dx'ed: 3419 Complications: none Therapy: insulin since 2017, and metformin.   DKA: never.  Severe hypoglycemia: never.   Pancreatitis: never.  Other: he takes human insulin, due to cost; he declines multiple daily injections.   Interval history: pt states cbg's are in the 100's.  pt states he feels well in general.  no recent steroids.  Past Medical History:  Diagnosis Date  . CAD (coronary artery disease)   . Change in voice   . Cough   . Depression   . Diabetes mellitus 04-25-11   oral meds  . ED (erectile dysfunction)   . Hepatic lesion    left lobe  . Hyperlipidemia   . Hypertension    dr Marilynn Rail  . Hypothyroidism   . Lung nodule   . Nasal congestion   . Osteoarthritis 04-25-11   spine area-some neck issues  . Paroxysmal atrial fibrillation (HCC)    started on metoprolol and Xarelto 07/02/2015 for afib RVR  . Pneumonia   . PONV (postoperative nausea and vomiting)   . Thyroid disease    thyroid lesion  . Tubular adenoma    polyps- Dr.Edwards    Past Surgical History:  Procedure Laterality Date  . CARDIAC CATHETERIZATION  04-25-11   many yrs ago-very slight blockage 1 vessel (60% RCA by Dr. Dorthy Cooler notes)  . CHOLECYSTECTOMY N/A 08/07/2018   Procedure: LAPAROSCOPIC CHOLECYSTECTOMY;  Surgeon: Kinsinger, Arta Bruce, MD;  Location: [place];  Service: General;  Laterality: N/A;  . EYE SURGERY  11/2010   cataract x2  . EYE SURGERY  12/2010   len implant x2   . KNEE SURGERY  04-25-11   right/ left knee scope  . THYROIDECTOMY  05/02/2011   Procedure: THYROIDECTOMY;  Surgeon: Earnstine Regal, MD;  Location: [facility];  Service: General;  Laterality: N/A;  Total Thyroidectomy  . TOTAL KNEE ARTHROPLASTY Right 04/25/2013   Procedure: TOTAL KNEE ARTHROPLASTY;  Surgeon: Kerin Salen, MD;  Location: [place];   Service: Orthopedics;  Laterality: Right;    Social History   Socioeconomic History  . Marital status: Widowed    Spouse name: Not on file  . Number of children: Not on file  . Years of education: Not on file  . Highest education level: Not on file  Occupational History  . Not on file  Tobacco Use  . Smoking status: Former Smoker    Years: 50.00    Quit date: 11/24/2009    Years since quitting: 10.4  . Smokeless tobacco: Never Used  Substance and Sexual Activity  . Alcohol use: Yes    Comment: Beer on weekends, 6 or more  . Drug use: No  . Sexual activity: Never  Other Topics Concern  . Not on file  Social History Narrative  . Not on file   Social Determinants of Health   Financial Resource Strain: Not on file  Food Insecurity: Not on file  Transportation Needs: Not on file  Physical Activity: Not on file  Stress: Not on file  Social Connections: Not on file  Intimate Partner Violence: Not on file    Current Outpatient Medications on File Prior to Visit  Medication Sig Dispense Refill  . acetaminophen (TYLENOL) 500 MG tablet Take 500 mg by mouth every 6 (six)  hours as needed for moderate pain (BACK PAIN).    Marland Kitchen amLODipine (NORVASC) 10 MG tablet Take 10 mg by mouth daily.    Marland Kitchen aspirin EC 81 MG tablet Take 81 mg by mouth daily.    . B-D INS SYR ULTRAFINE 1CC/30G 30G X 1/2" 1 ML MISC USE TO INJECT INSULIN ONCE DAILY 90 each 2  . fexofenadine (ALLEGRA) 180 MG tablet Take 180 mg by mouth daily as needed for allergies.     . fluticasone (VERAMYST) 27.5 MCG/SPRAY nasal spray Place 2 sprays into the nose daily as needed for allergies.     . hydrochlorothiazide (HYDRODIURIL) 25 MG tablet Take 50 mg by mouth daily.     Marland Kitchen levothyroxine (SYNTHROID, LEVOTHROID) 150 MCG tablet Take 175 mcg by mouth daily before breakfast.    . metFORMIN (GLUCOPHAGE) 500 MG tablet Take 1,000 mg by mouth 2 (two) times daily with a meal.    . metoprolol tartrate (LOPRESSOR) 25 MG tablet TAKE 1 TABLET  TWICE DAILY 180 tablet 3  . Multiple Vitamin (MULTIVITAMIN) tablet Take 1 tablet by mouth daily.    Marland Kitchen NOVOLIN N 100 UNIT/ML injection INJECT 55 UNITS TOTAL INTO THE SKIN DAILY. 50 mL 1  . oxyCODONE (OXY IR/ROXICODONE) 5 MG immediate release tablet Take 1 tablet (5 mg total) by mouth every 6 (six) hours as needed for moderate pain. 15 tablet 0  . pantoprazole (PROTONIX) 40 MG tablet Take 40 mg by mouth daily.    . polyethylene glycol (MIRALAX / GLYCOLAX) 17 g packet Take 17 g by mouth daily as needed for mild constipation. 14 each 0   No current facility-administered medications on file prior to visit.    Allergies  Allergen Reactions  . Actos [Pioglitazone Hydrochloride] Nausea Only  . Glyburide Other (See Comments)    hypoglycemia  . Metformin And Related Other (See Comments)    Hypoglycemia, shakes Fast acting metformin  . Codeine Itching    Family History  Problem Relation Age of Onset  . Diabetes Mother   . Diabetes Brother   . Colon cancer Brother   . COPD Other   . Hypertension Other   . Hyperlipidemia Other   . Heart attack Other   . Diabetes Sister     BP (!) 160/70 (BP Location: Right Arm, Patient Position: Sitting, Cuff Size: Normal)   Pulse 65   Ht 5\' 10"  (1.778 m)   Wt 233 lb 3.2 oz (105.8 kg)   SpO2 93%   BMI 33.46 kg/m    Review of Systems He denies hypoglycemia.     Objective:   Physical Exam VITAL SIGNS:  See vs page GENERAL: no distress Pulses: dorsalis pedis intact bilat.   MSK: no deformity of the feet CV: 2+ bilat leg edema Skin:  no ulcer on the feet.  normal color and temp on the feet.  Neuro: sensation is intact to touch on the feet.     Lab Results  Component Value Date   HGBA1C 7.6 (A) 04/16/2020       Assessment & Plan:  HTN: is noted today Insulin-requiring type 2 DM: this is the best control this pt should aim for, given this regimen, which does match insulin to his changing needs throughout the day  Patient Instructions   Your blood pressure is high today.  Please see your primary care provider soon, to have it rechecked.  Please continue the same insulin.   check your blood sugar twice a day.  vary the time  of day when you check, between before the 3 meals, and at bedtime.  also check if you have symptoms of your blood sugar being too high or too low.  please keep a record of the readings and bring it to your next appointment here (or you can bring the meter itself).  You can write it on any piece of paper.  please call us sooner if your blood sugar goes below 70, or if you have a lot of readings over 200.   Please come back for a follow-up appointment in 4 months.

## 2020-05-10 DIAGNOSIS — Z79899 Other long term (current) drug therapy: Secondary | ICD-10-CM | POA: Diagnosis not present

## 2020-05-12 DIAGNOSIS — N401 Enlarged prostate with lower urinary tract symptoms: Secondary | ICD-10-CM | POA: Diagnosis not present

## 2020-05-12 DIAGNOSIS — E039 Hypothyroidism, unspecified: Secondary | ICD-10-CM | POA: Diagnosis not present

## 2020-05-12 DIAGNOSIS — E7800 Pure hypercholesterolemia, unspecified: Secondary | ICD-10-CM | POA: Diagnosis not present

## 2020-05-12 DIAGNOSIS — E1165 Type 2 diabetes mellitus with hyperglycemia: Secondary | ICD-10-CM | POA: Diagnosis not present

## 2020-05-12 DIAGNOSIS — I10 Essential (primary) hypertension: Secondary | ICD-10-CM | POA: Diagnosis not present

## 2020-05-12 DIAGNOSIS — G4700 Insomnia, unspecified: Secondary | ICD-10-CM | POA: Diagnosis not present

## 2020-05-12 DIAGNOSIS — I2510 Atherosclerotic heart disease of native coronary artery without angina pectoris: Secondary | ICD-10-CM | POA: Diagnosis not present

## 2020-05-12 DIAGNOSIS — E1169 Type 2 diabetes mellitus with other specified complication: Secondary | ICD-10-CM | POA: Diagnosis not present

## 2020-05-12 DIAGNOSIS — I480 Paroxysmal atrial fibrillation: Secondary | ICD-10-CM | POA: Diagnosis not present

## 2020-06-01 DIAGNOSIS — I10 Essential (primary) hypertension: Secondary | ICD-10-CM | POA: Diagnosis not present

## 2020-06-01 DIAGNOSIS — E1165 Type 2 diabetes mellitus with hyperglycemia: Secondary | ICD-10-CM | POA: Diagnosis not present

## 2020-06-01 DIAGNOSIS — E039 Hypothyroidism, unspecified: Secondary | ICD-10-CM | POA: Diagnosis not present

## 2020-06-01 DIAGNOSIS — I480 Paroxysmal atrial fibrillation: Secondary | ICD-10-CM | POA: Diagnosis not present

## 2020-06-01 DIAGNOSIS — E1169 Type 2 diabetes mellitus with other specified complication: Secondary | ICD-10-CM | POA: Diagnosis not present

## 2020-06-01 DIAGNOSIS — N401 Enlarged prostate with lower urinary tract symptoms: Secondary | ICD-10-CM | POA: Diagnosis not present

## 2020-06-01 DIAGNOSIS — E7800 Pure hypercholesterolemia, unspecified: Secondary | ICD-10-CM | POA: Diagnosis not present

## 2020-06-01 DIAGNOSIS — I2510 Atherosclerotic heart disease of native coronary artery without angina pectoris: Secondary | ICD-10-CM | POA: Diagnosis not present

## 2020-06-01 DIAGNOSIS — G4700 Insomnia, unspecified: Secondary | ICD-10-CM | POA: Diagnosis not present

## 2020-07-21 ENCOUNTER — Other Ambulatory Visit: Payer: Self-pay | Admitting: Endocrinology

## 2020-07-21 DIAGNOSIS — Z794 Long term (current) use of insulin: Secondary | ICD-10-CM

## 2020-08-02 DIAGNOSIS — I480 Paroxysmal atrial fibrillation: Secondary | ICD-10-CM | POA: Diagnosis not present

## 2020-08-02 DIAGNOSIS — I10 Essential (primary) hypertension: Secondary | ICD-10-CM | POA: Diagnosis not present

## 2020-08-02 DIAGNOSIS — E7800 Pure hypercholesterolemia, unspecified: Secondary | ICD-10-CM | POA: Diagnosis not present

## 2020-08-02 DIAGNOSIS — G4700 Insomnia, unspecified: Secondary | ICD-10-CM | POA: Diagnosis not present

## 2020-08-02 DIAGNOSIS — I2510 Atherosclerotic heart disease of native coronary artery without angina pectoris: Secondary | ICD-10-CM | POA: Diagnosis not present

## 2020-08-02 DIAGNOSIS — E1169 Type 2 diabetes mellitus with other specified complication: Secondary | ICD-10-CM | POA: Diagnosis not present

## 2020-08-02 DIAGNOSIS — E1165 Type 2 diabetes mellitus with hyperglycemia: Secondary | ICD-10-CM | POA: Diagnosis not present

## 2020-08-02 DIAGNOSIS — E039 Hypothyroidism, unspecified: Secondary | ICD-10-CM | POA: Diagnosis not present

## 2020-08-02 DIAGNOSIS — N401 Enlarged prostate with lower urinary tract symptoms: Secondary | ICD-10-CM | POA: Diagnosis not present

## 2020-08-20 ENCOUNTER — Ambulatory Visit (INDEPENDENT_AMBULATORY_CARE_PROVIDER_SITE_OTHER): Payer: Medicare HMO | Admitting: Endocrinology

## 2020-08-20 ENCOUNTER — Other Ambulatory Visit: Payer: Self-pay

## 2020-08-20 VITALS — BP 130/60 | HR 60 | Ht 70.0 in | Wt 234.2 lb

## 2020-08-20 DIAGNOSIS — E119 Type 2 diabetes mellitus without complications: Secondary | ICD-10-CM | POA: Diagnosis not present

## 2020-08-20 DIAGNOSIS — Z794 Long term (current) use of insulin: Secondary | ICD-10-CM | POA: Diagnosis not present

## 2020-08-20 LAB — POCT GLYCOSYLATED HEMOGLOBIN (HGB A1C): Hemoglobin A1C: 9 % — AB (ref 4.0–5.6)

## 2020-08-20 MED ORDER — INSULIN NPH (HUMAN) (ISOPHANE) 100 UNIT/ML ~~LOC~~ SUSP: 70.0000 [IU] | Freq: Every morning | SUBCUTANEOUS | 3 refills | Status: DC

## 2020-08-20 NOTE — Patient Instructions (Addendum)
Please increase the insulin to 70 units each morning.   Please continue the same metformin.  check your blood sugar twice a day.  vary the time of day when you check, between before the 3 meals, and at bedtime.  also check if you have symptoms of your blood sugar being too high or too low.  please keep a record of the readings and bring it to your next appointment here (or you can bring the meter itself).  You can write it on any piece of paper.  please call us sooner if your blood sugar goes below 70, or if you have a lot of readings over 200.   Please come back for a follow-up appointment in 2 months.

## 2020-08-20 NOTE — Progress Notes (Signed)
Subjective:    Patient ID: Christopher Patel, male    DOB: Dec 17, 1945, 75 y.o.   MRN: [id number]  HPI Pt returns for f/u of diabetes mellitus:  DM type: Insulin-requiring type 2 Dx'ed: 0000000 Complications: none Therapy: insulin since 2017, and metformin.   DKA: never.  Severe hypoglycemia: never.   Pancreatitis: never.  Other: he takes human insulin, due to cost; he declines multiple daily injections.   Interval history: no cbg record, but states cbg's vary from 110-175.  pt states he feels well in general.  no recent steroids. Pt says diet is poor but he does not miss meds.   Past Medical History:  Diagnosis Date   CAD (coronary artery disease)    Change in voice    Cough    Depression    Diabetes mellitus 04-25-11   oral meds   ED (erectile dysfunction)    Hepatic lesion    left lobe   Hyperlipidemia    Hypertension    dr Marilynn Rail   Hypothyroidism    Lung nodule    Nasal congestion    Osteoarthritis 04-25-11   spine area-some neck issues   Paroxysmal atrial fibrillation ([place])    started on metoprolol and Xarelto 07/02/2015 for afib RVR   Pneumonia    PONV (postoperative nausea and vomiting)    Thyroid disease    thyroid lesion   Tubular adenoma    polyps- Dr.Edwards    Past Surgical History:  Procedure Laterality Date   CARDIAC CATHETERIZATION  04-25-11   many yrs ago-very slight blockage 1 vessel (60% RCA by Dr. 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   Procedure: TOTAL KNEE ARTHROPLASTY;   Surgeon: Kerin Salen, MD;  Location: [facility];  Service: Orthopedics;  Laterality: Right;    Social History   Socioeconomic History   Marital status: Widowed    Spouse name: Not on file   Number of children: Not on file   Years of education: Not on file   Highest education level: Not on file  Occupational History   Not on file  Tobacco Use   Smoking status: Former    Years: 50.00    Types: Cigarettes    Quit date: 11/24/2009    Years since quitting: 10.7   Smokeless tobacco: Never  Substance and Sexual Activity   Alcohol use: Yes    Comment: Beer on weekends, 6 or more   Drug use: No   Sexual activity: Never  Other Topics Concern   Not on file  Social History Narrative   Not on file   Social Determinants of Health   Financial Resource Strain: Not on file  Food Insecurity: Not on file  Transportation Needs: Not on file  Physical Activity: Not on file  Stress: Not on file  Social Connections: Not on file  Intimate Partner Violence: Not on file    Current Outpatient Medications on File Prior to Visit  Medication Sig  Dispense Refill   acetaminophen (TYLENOL) 500 MG tablet Take 500 mg by mouth every 6 (six) hours as needed for moderate pain (BACK PAIN).     amLODipine (NORVASC) 10 MG tablet Take 10 mg by mouth daily.     aspirin EC 81 MG tablet Take 81 mg by mouth daily.     B-D INS SYR ULTRAFINE 1CC/30G 30G X 1/2" 1 ML MISC USE TO INJECT INSULIN ONCE DAILY 90 each 2   fexofenadine (ALLEGRA) 180 MG tablet Take 180 mg by mouth daily as needed for allergies.      fluticasone (VERAMYST) 27.5 MCG/SPRAY nasal spray Place 2 sprays into the nose daily as needed for allergies.      hydrochlorothiazide (HYDRODIURIL) 25 MG tablet Take 50 mg by mouth daily.      levothyroxine (SYNTHROID, LEVOTHROID) 150 MCG tablet Take 175 mcg by mouth daily before breakfast.     metFORMIN (GLUCOPHAGE) 500 MG tablet Take 1,000 mg by mouth 2 (two) times daily with a meal.     metoprolol tartrate  (LOPRESSOR) 25 MG tablet TAKE 1 TABLET TWICE DAILY 180 tablet 3   Multiple Vitamin (MULTIVITAMIN) tablet Take 1 tablet by mouth daily.     pantoprazole (PROTONIX) 40 MG tablet Take 40 mg by mouth daily.     polyethylene glycol (MIRALAX / GLYCOLAX) 17 g packet Take 17 g by mouth daily as needed for mild constipation. 14 each 0   No current facility-administered medications on file prior to visit.    Allergies  Allergen Reactions   Actos [Pioglitazone Hydrochloride] Nausea Only   Glyburide Other (See Comments)    hypoglycemia   Metformin And Related Other (See Comments)    Hypoglycemia, shakes Fast acting metformin   Codeine Itching    Family History  Problem Relation Age of Onset   Diabetes Mother    Diabetes Brother    Colon cancer Brother    COPD Other    Hypertension Other    Hyperlipidemia Other    Heart attack Other    Diabetes Sister     BP 130/60 (BP Location: Right Arm, Patient Position: Sitting, Cuff Size: Large)   Pulse 60   Ht '5\' 10"'$  (1.778 m)   Wt 234 lb 3.2 oz (106.2 kg)   SpO2 93%   BMI 33.60 kg/m    Review of Systems He denies hypoglycemia.      Objective:   Physical Exam Pulses: dorsalis pedis intact bilat.   MSK: no deformity of the feet CV: 1+ bilat leg edema Skin:  no ulcer on the feet.  normal color and temp on the feet. Neuro: sensation is intact to touch on the feet    Lab Results  Component Value Date   HGBA1C 9.0 (A) 08/20/2020   Lab Results  Component Value Date   CREATININE 1.07 08/08/2018   BUN 10 08/08/2018   NA 135 08/08/2018   K 3.7 08/08/2018   CL 97 (L) 08/08/2018   CO2 29 08/08/2018      Assessment & Plan:  Insulin-requiring type 2 DM: uncontrolled.   Patient Instructions  Please increase the insulin to 70 units each morning.   Please continue the same metformin.  check your blood sugar twice a day.  vary the time of day when you check, between before the 3 meals, and at bedtime.  also check if you have symptoms  of your blood sugar being too high or too low.  please keep a record of the readings and  bring it to your next appointment here (or you can bring the meter itself).  You can write it on any piece of paper.  please call us sooner if your blood sugar goes below 70, or if you have a lot of readings over 200.   Please come back for a follow-up appointment in 2 months.

## 2020-09-02 ENCOUNTER — Other Ambulatory Visit: Payer: Self-pay | Admitting: Family Medicine

## 2020-09-02 DIAGNOSIS — I779 Disorder of arteries and arterioles, unspecified: Secondary | ICD-10-CM

## 2020-09-02 DIAGNOSIS — E7800 Pure hypercholesterolemia, unspecified: Secondary | ICD-10-CM | POA: Diagnosis not present

## 2020-09-02 DIAGNOSIS — E039 Hypothyroidism, unspecified: Secondary | ICD-10-CM | POA: Diagnosis not present

## 2020-09-02 DIAGNOSIS — E1169 Type 2 diabetes mellitus with other specified complication: Secondary | ICD-10-CM | POA: Diagnosis not present

## 2020-09-02 DIAGNOSIS — I10 Essential (primary) hypertension: Secondary | ICD-10-CM | POA: Diagnosis not present

## 2020-09-02 DIAGNOSIS — Z79899 Other long term (current) drug therapy: Secondary | ICD-10-CM | POA: Diagnosis not present

## 2020-09-10 ENCOUNTER — Ambulatory Visit
Admission: RE | Admit: 2020-09-10 | Discharge: 2020-09-10 | Disposition: A | Discharge status: Home / Self Care | Payer: Medicare HMO | Source: Ambulatory Visit | Attending: Family Medicine | Admitting: Family Medicine

## 2020-09-10 DIAGNOSIS — I6523 Occlusion and stenosis of bilateral carotid arteries: Secondary | ICD-10-CM | POA: Diagnosis not present

## 2020-09-10 DIAGNOSIS — I10 Essential (primary) hypertension: Secondary | ICD-10-CM | POA: Diagnosis not present

## 2020-09-10 DIAGNOSIS — E785 Hyperlipidemia, unspecified: Secondary | ICD-10-CM | POA: Diagnosis not present

## 2020-09-10 DIAGNOSIS — I779 Disorder of arteries and arterioles, unspecified: Secondary | ICD-10-CM

## 2020-10-21 ENCOUNTER — Ambulatory Visit (INDEPENDENT_AMBULATORY_CARE_PROVIDER_SITE_OTHER): Payer: Medicare HMO | Admitting: Endocrinology

## 2020-10-21 ENCOUNTER — Other Ambulatory Visit: Payer: Self-pay

## 2020-10-21 VITALS — BP 150/56 | HR 86 | Ht 70.0 in | Wt 234.4 lb

## 2020-10-21 DIAGNOSIS — Z794 Long term (current) use of insulin: Secondary | ICD-10-CM

## 2020-10-21 DIAGNOSIS — E119 Type 2 diabetes mellitus without complications: Secondary | ICD-10-CM

## 2020-10-21 LAB — POCT GLYCOSYLATED HEMOGLOBIN (HGB A1C): Hemoglobin A1C: 7.7 % — AB (ref 4.0–5.6)

## 2020-10-21 NOTE — Progress Notes (Signed)
Subjective:    Patient ID: Christopher Patel, male    DOB: [birth year], 75 y.o.   MRN: [id number]  HPI Pt returns for f/u of diabetes mellitus:  DM type: Insulin-requiring type 2 Dx'ed: 5176 Complications: none Therapy: insulin since 2017, and metformin.   DKA: never.  Severe hypoglycemia: never.   Pancreatitis: never.  Other: he takes human insulin, due to cost; he declines multiple daily injections.   Interval history: no cbg record, but states cbg's vary from 96-165.  It is in general higher as the day goes on.  pt states he feels well in general.  no recent steroids. Pt says he does not miss meds.   Past Medical History:  Diagnosis Date   CAD (coronary artery disease)    Change in voice    Cough    Depression    Diabetes mellitus 04-25-11   oral meds   ED (erectile dysfunction)    Hepatic lesion    left lobe   Hyperlipidemia    Hypertension    dr Marilynn Rail   Hypothyroidism    Lung nodule    Nasal congestion    Osteoarthritis 04-25-11   spine area-some neck issues   Paroxysmal atrial fibrillation ([place])    started on metoprolol and Xarelto 07/02/2015 for afib RVR   Pneumonia    PONV (postoperative nausea and vomiting)    Thyroid disease    thyroid lesion   Tubular adenoma    polyps- Dr.Edwards    Past Surgical History:  Procedure Laterality Date   CARDIAC CATHETERIZATION  04-25-11   many yrs ago-very slight blockage 1 vessel (60% RCA by Dr. Dorthy Cooler notes)   CHOLECYSTECTOMY N/A 08/07/2018   Procedure: LAPAROSCOPIC CHOLECYSTECTOMY;  Surgeon: Mickeal Skinner, MD;  Location: [facility];  Service: General;  Laterality: N/A;   EYE SURGERY  11/2010   cataract x2   EYE SURGERY  12/2010   len implant x2    KNEE SURGERY  04-25-11   right/ left knee scope   THYROIDECTOMY  05/02/2011   Procedure: THYROIDECTOMY;  Surgeon: Earnstine Regal, MD;  Location: [facility];  Service: General;  Laterality: N/A;  Total Thyroidectomy   TOTAL KNEE ARTHROPLASTY Right 04/25/2013   Procedure:  TOTAL KNEE ARTHROPLASTY;  Surgeon: Kerin Salen, MD;  Location: [facility];  Service: Orthopedics;  Laterality: Right;    Social History   Socioeconomic History   Marital status: Widowed    Spouse name: Not on file   Number of children: Not on file   Years of education: Not on file   Highest education level: Not on file  Occupational History   Not on file  Tobacco Use   Smoking status: Former    Years: 50.00    Types: Cigarettes    Quit date: 11/24/2009    Years since quitting: 10.9   Smokeless tobacco: Never  Substance and Sexual Activity   Alcohol use: Yes    Comment: Beer on weekends, 6 or more   Drug use: No   Sexual activity: Never  Other Topics Concern   Not on file  Social History Narrative   Not on file   Social Determinants of Health   Financial Resource Strain: Not on file  Food Insecurity: Not on file  Transportation Needs: Not on file  Physical Activity: Not on file  Stress: Not on file  Social Connections: Not on file  Intimate Partner Violence: Not on file    Current Outpatient Medications on  File Prior to Visit  Medication Sig Dispense Refill   acetaminophen (TYLENOL) 500 MG tablet Take 500 mg by mouth every 6 (six) hours as needed for moderate pain (BACK PAIN).     amLODipine (NORVASC) 10 MG tablet Take 10 mg by mouth daily.     aspirin EC 81 MG tablet Take 81 mg by mouth daily.     B-D INS SYR ULTRAFINE 1CC/30G 30G X 1/2" 1 ML MISC USE TO INJECT INSULIN ONCE DAILY 90 each 2   fexofenadine (ALLEGRA) 180 MG tablet Take 180 mg by mouth daily as needed for allergies.      fluticasone (VERAMYST) 27.5 MCG/SPRAY nasal spray Place 2 sprays into the nose daily as needed for allergies.      hydrochlorothiazide (HYDRODIURIL) 25 MG tablet Take 50 mg by mouth daily.      insulin NPH Human (NOVOLIN N) 100 UNIT/ML injection Inject 0.7 mLs (70 Units total) into the skin in the morning. 70 mL 3   levothyroxine (SYNTHROID, LEVOTHROID) 150 MCG tablet Take 175 mcg by mouth  daily before breakfast.     metFORMIN (GLUCOPHAGE) 500 MG tablet Take 1,000 mg by mouth 2 (two) times daily with a meal.     metoprolol tartrate (LOPRESSOR) 25 MG tablet TAKE 1 TABLET TWICE DAILY 180 tablet 3   Multiple Vitamin (MULTIVITAMIN) tablet Take 1 tablet by mouth daily.     pantoprazole (PROTONIX) 40 MG tablet Take 40 mg by mouth daily.     polyethylene glycol (MIRALAX / GLYCOLAX) 17 g packet Take 17 g by mouth daily as needed for mild constipation. 14 each 0   No current facility-administered medications on file prior to visit.    Allergies  Allergen Reactions   Actos [Pioglitazone Hydrochloride] Nausea Only   Glyburide Other (See Comments)    hypoglycemia   Metformin And Related Other (See Comments)    Hypoglycemia, shakes Fast acting metformin   Codeine Itching    Family History  Problem Relation Age of Onset   Diabetes Mother    Diabetes Brother    Colon cancer Brother    COPD Other    Hypertension Other    Hyperlipidemia Other    Heart attack Other    Diabetes Sister     BP (!) 150/56 (BP Location: Right Arm, Patient Position: Sitting, Cuff Size: Large)   Pulse 86   Ht 5\' 10"  (1.778 m)   Wt 234 lb 6.4 oz (106.3 kg)   SpO2 93%   BMI 33.63 kg/m    Review of Systems He denies hypoglycemia.      Objective:   Physical Exam Pulses: dorsalis pedis intact bilat.   MSK: no deformity of the feet CV: 1+ bilat leg edema Skin:  no ulcer on the feet.  normal color and temp on the feet.   Neuro: sensation is intact to touch on the feet.     Lab Results  Component Value Date   HGBA1C 7.7 (A) 10/21/2020      Assessment & Plan:  Insulin-requiring type 2 DM: this is the best control this pt should aim for, given this regimen, which does match insulin to his changing needs throughout the day  Patient Instructions  Your blood pressure is high today.  Please see your primary care provider, to have it rechecked. Please continue the same insulin and metformin.    check your blood sugar twice a day.  vary the time of day when you check, between before the 3 meals, and  at bedtime.  also check if you have symptoms of your blood sugar being too high or too low.  please keep a record of the readings and bring it to your next appointment here (or you can bring the meter itself).  You can write it on any piece of paper.  please call us sooner if your blood sugar goes below 70, or if you have a lot of readings over 200.   Please come back for a follow-up appointment in January.

## 2020-10-21 NOTE — Patient Instructions (Addendum)
Your blood pressure is high today.  Please see your primary care provider, to have it rechecked. Please continue the same insulin and metformin.   check your blood sugar twice a day.  vary the time of day when you check, between before the 3 meals, and at bedtime.  also check if you have symptoms of your blood sugar being too high or too low.  please keep a record of the readings and bring it to your next appointment here (or you can bring the meter itself).  You can write it on any piece of paper.  please call us sooner if your blood sugar goes below 70, or if you have a lot of readings over 200.   Please come back for a follow-up appointment in January.

## 2020-10-22 ENCOUNTER — Encounter: Payer: Self-pay | Admitting: Endocrinology

## 2020-10-22 DIAGNOSIS — H5213 Myopia, bilateral: Secondary | ICD-10-CM | POA: Diagnosis not present

## 2020-10-22 LAB — HM DIABETES EYE EXAM

## 2020-12-06 DIAGNOSIS — E1165 Type 2 diabetes mellitus with hyperglycemia: Secondary | ICD-10-CM | POA: Diagnosis not present

## 2020-12-06 DIAGNOSIS — I10 Essential (primary) hypertension: Secondary | ICD-10-CM | POA: Diagnosis not present

## 2020-12-06 DIAGNOSIS — I2510 Atherosclerotic heart disease of native coronary artery without angina pectoris: Secondary | ICD-10-CM | POA: Diagnosis not present

## 2020-12-06 DIAGNOSIS — G4700 Insomnia, unspecified: Secondary | ICD-10-CM | POA: Diagnosis not present

## 2020-12-06 DIAGNOSIS — I480 Paroxysmal atrial fibrillation: Secondary | ICD-10-CM | POA: Diagnosis not present

## 2020-12-06 DIAGNOSIS — E1169 Type 2 diabetes mellitus with other specified complication: Secondary | ICD-10-CM | POA: Diagnosis not present

## 2020-12-06 DIAGNOSIS — E039 Hypothyroidism, unspecified: Secondary | ICD-10-CM | POA: Diagnosis not present

## 2020-12-06 DIAGNOSIS — N401 Enlarged prostate with lower urinary tract symptoms: Secondary | ICD-10-CM | POA: Diagnosis not present

## 2020-12-06 DIAGNOSIS — E7800 Pure hypercholesterolemia, unspecified: Secondary | ICD-10-CM | POA: Diagnosis not present

## 2020-12-21 IMAGING — CT CT ABDOMEN AND PELVIS WITH CONTRAST
2 of 5 series · 16 of 46 positions shown, 18 images · IV contrast (Omni 300)
Comparison: 08/31/2016

CLINICAL DATA: Right lower quadrant pain radiating to the back

EXAM:
CT ABDOMEN AND PELVIS WITH CONTRAST
TECHNIQUE: Multidetector CT imaging of the abdomen and pelvis was performed
using the standard protocol following bolus administration of
intravenous contrast.
CONTRAST:  100mL OMNIPAQUE IOHEXOL 300 MG/ML  SOLN

[Series 3: a/p w/ 5mm · axial · 0.92mm/px · z∈[+671,+1146]mm · 13 of 107 slices shown, 15 images]
[im 6/107  soft-tissue]
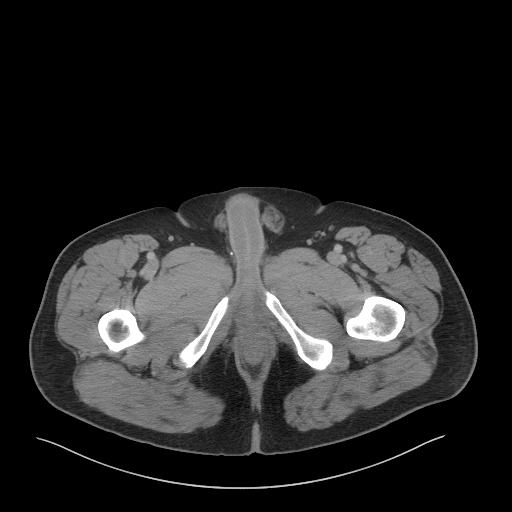
[im 6/107  bone]
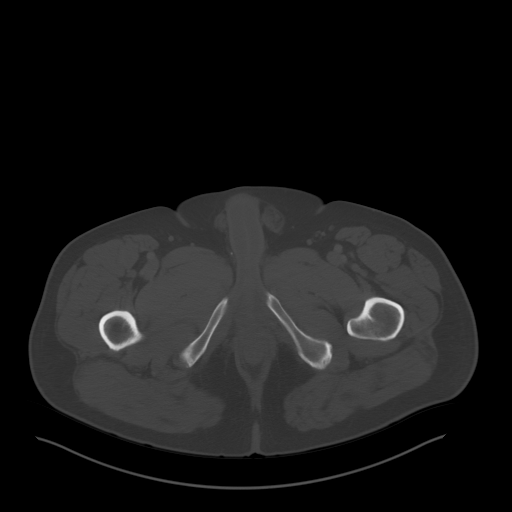
[im 16/107  soft-tissue]
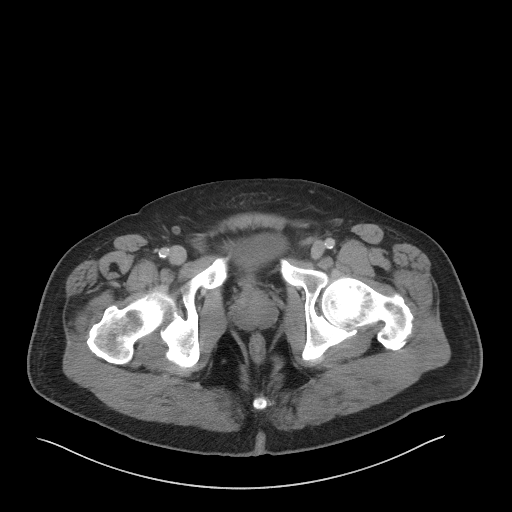
[im 22/107  soft-tissue]
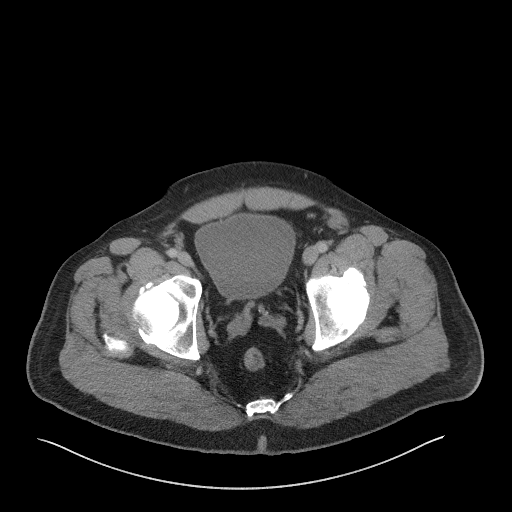
[im 32/107  soft-tissue]
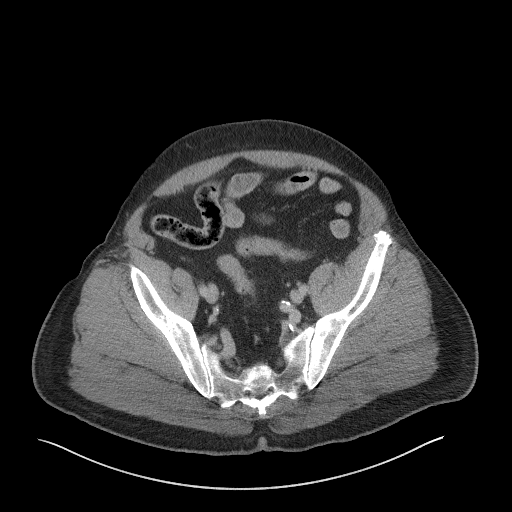
[im 38/107  soft-tissue]
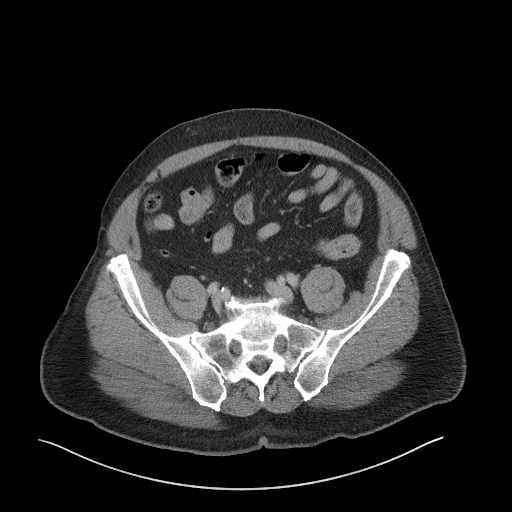
[im 48/107  soft-tissue]
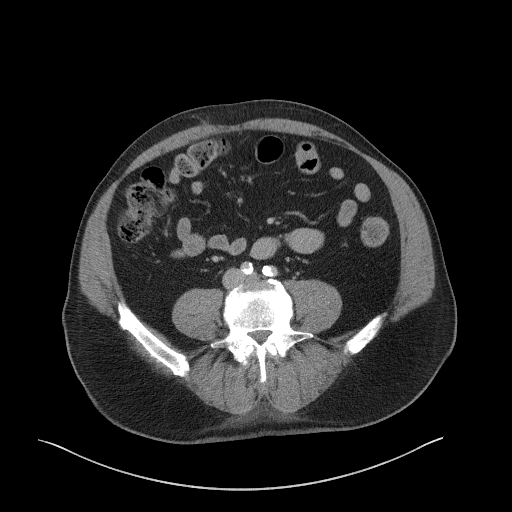
[im 54/107  soft-tissue]
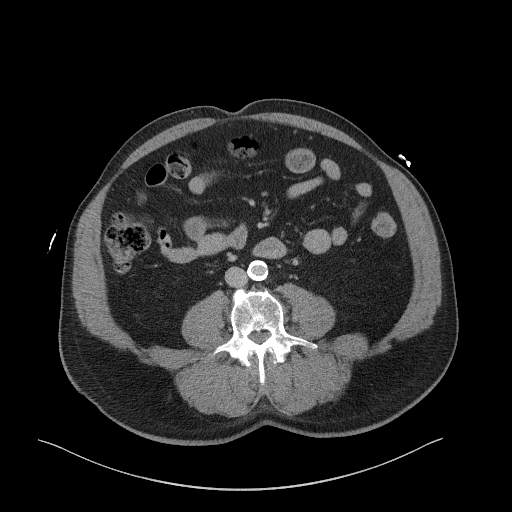
[im 59/107  soft-tissue]
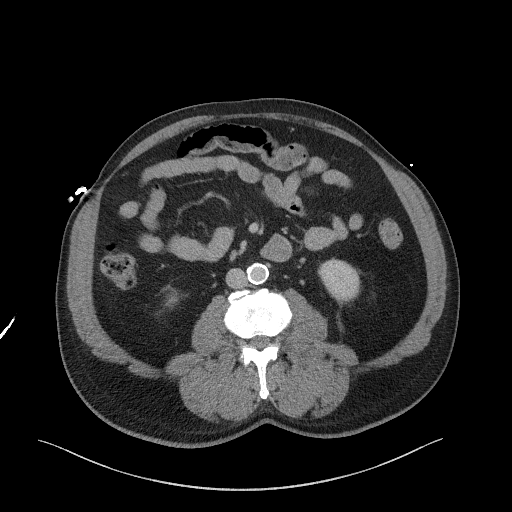
[im 69/107  soft-tissue]
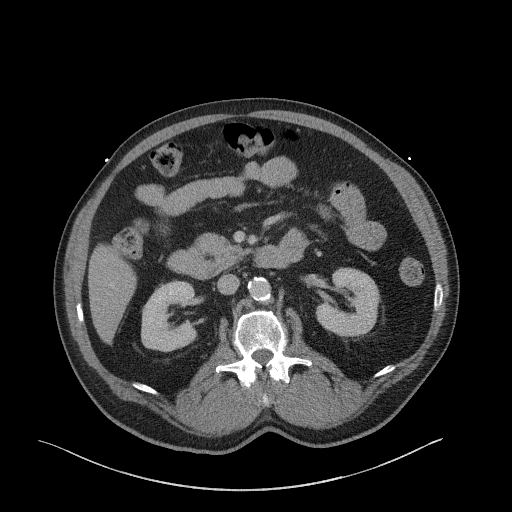
[im 69/107  bone]
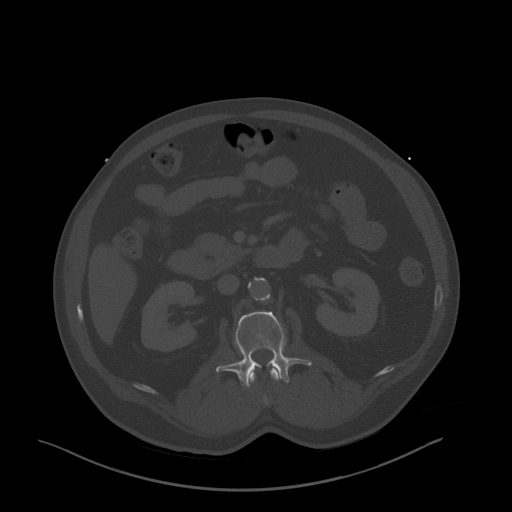
[im 75/107  soft-tissue]
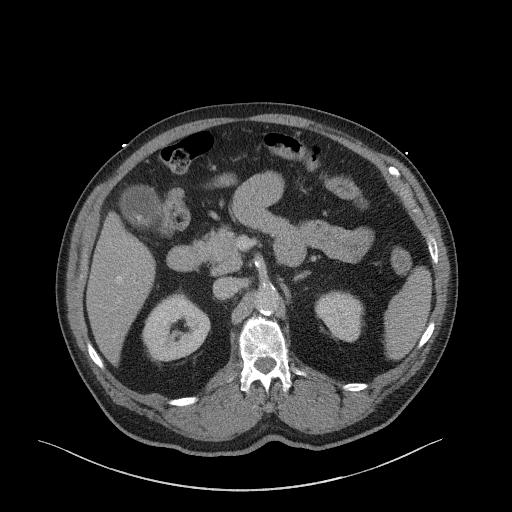
[im 85/107  soft-tissue]
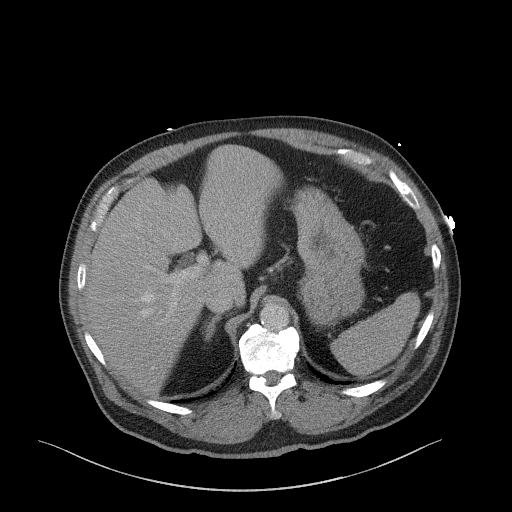
[im 91/107  soft-tissue]
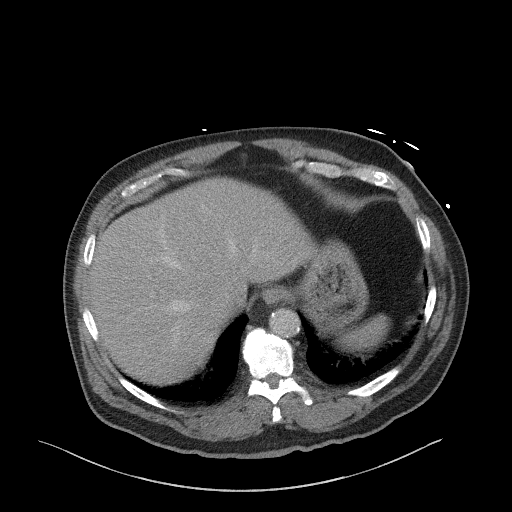
[im 101/107  soft-tissue]
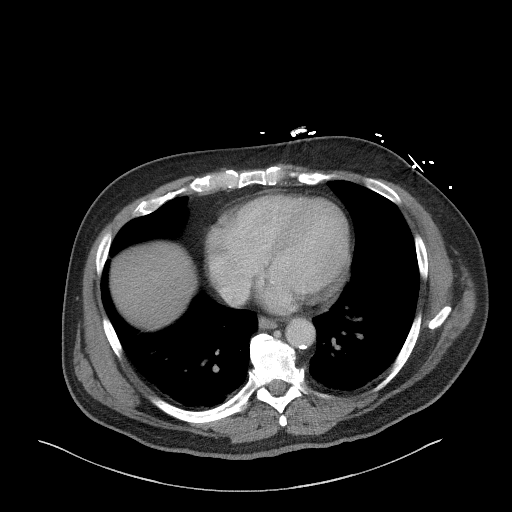

[Series 6: a/p w/ cor · coronal · 0.83mm/px · 3 of 161 slices shown]
[im 54/161  soft-tissue]
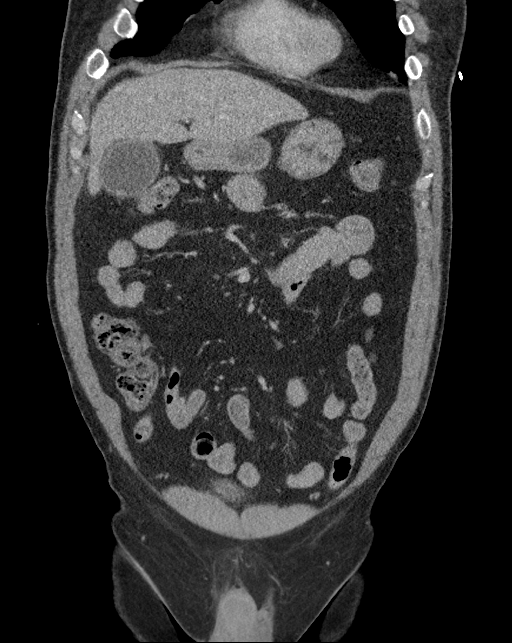
[im 72/161  soft-tissue]
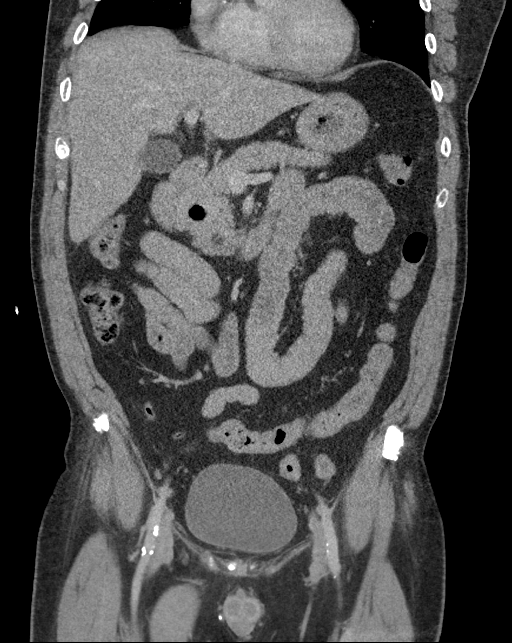
[im 89/161  soft-tissue]
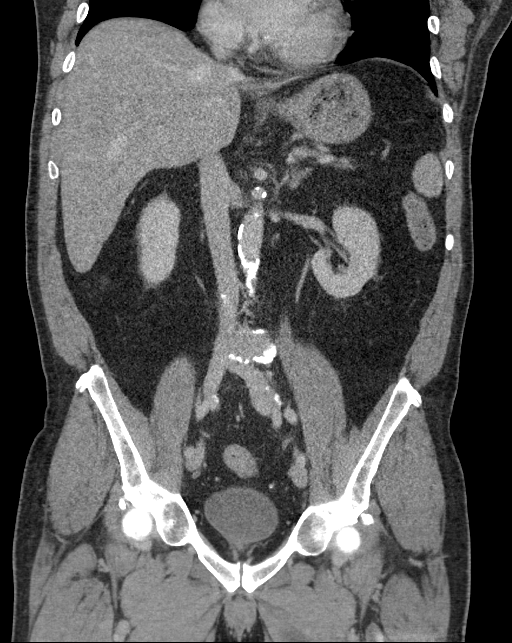

[16 of 46 positions shown; findings below may reference images not displayed]

FINDINGS: Lower chest: No acute finding. 22 mm lobulated posterior mediastinal
mass left of the lower thoracic spine there remain stable and
benign.

Hepatobiliary: No focal liver abnormality.Distended gallbladder
which contains a stone. No definite wall thickening but there is
pericholecystic edema along the gallbladder liver interface.

Pancreas: Unremarkable.

Spleen: Unremarkable.

Adrenals/Urinary Tract: 15 mm left adrenal nodule consistent with
adenoma given stability. No hydronephrosis or stone. Unremarkable
bladder.

Stomach/Bowel:  No obstruction. No appendicitis.

Vascular/Lymphatic: No acute vascular abnormality. Atherosclerosis.
No mass or adenopathy.

Reproductive:Seminal vesicle calcification often seen with diabetes.

Other: No ascites or pneumoperitoneum. Fatty enlargement of the left
inguinal canal.

Musculoskeletal: Spondylosis.  No acute abnormalities.
IMPRESSION: 1. Full gallbladder with stone and mild pericholecystic edema, a
biliary cause of pain is still considered despite the ultrasound.
2. Negative appendix.
3. Chronic findings are stable and described above.

## 2021-01-12 DIAGNOSIS — E039 Hypothyroidism, unspecified: Secondary | ICD-10-CM | POA: Diagnosis not present

## 2021-01-12 DIAGNOSIS — E1169 Type 2 diabetes mellitus with other specified complication: Secondary | ICD-10-CM | POA: Diagnosis not present

## 2021-01-12 DIAGNOSIS — G4700 Insomnia, unspecified: Secondary | ICD-10-CM | POA: Diagnosis not present

## 2021-01-12 DIAGNOSIS — E1165 Type 2 diabetes mellitus with hyperglycemia: Secondary | ICD-10-CM | POA: Diagnosis not present

## 2021-01-12 DIAGNOSIS — I10 Essential (primary) hypertension: Secondary | ICD-10-CM | POA: Diagnosis not present

## 2021-01-12 DIAGNOSIS — E7800 Pure hypercholesterolemia, unspecified: Secondary | ICD-10-CM | POA: Diagnosis not present

## 2021-01-25 ENCOUNTER — Other Ambulatory Visit: Payer: Self-pay

## 2021-01-25 DIAGNOSIS — E119 Type 2 diabetes mellitus without complications: Secondary | ICD-10-CM

## 2021-01-25 DIAGNOSIS — Z794 Long term (current) use of insulin: Secondary | ICD-10-CM

## 2021-01-25 MED ORDER — INSULIN NPH (HUMAN) (ISOPHANE) 100 UNIT/ML ~~LOC~~ SUSP: 70.0000 [IU] | Freq: Every morning | SUBCUTANEOUS | 3 refills | Status: DC

## 2021-02-08 DIAGNOSIS — G4733 Obstructive sleep apnea (adult) (pediatric): Secondary | ICD-10-CM | POA: Diagnosis not present

## 2021-02-21 ENCOUNTER — Encounter: Payer: Self-pay | Admitting: Endocrinology

## 2021-02-21 ENCOUNTER — Other Ambulatory Visit: Payer: Self-pay

## 2021-02-21 ENCOUNTER — Ambulatory Visit: Payer: Medicare HMO | Admitting: Endocrinology

## 2021-02-21 VITALS — BP 152/74 | HR 62 | Ht 70.0 in | Wt 235.6 lb

## 2021-02-21 DIAGNOSIS — E119 Type 2 diabetes mellitus without complications: Secondary | ICD-10-CM

## 2021-02-21 DIAGNOSIS — E1169 Type 2 diabetes mellitus with other specified complication: Secondary | ICD-10-CM | POA: Diagnosis not present

## 2021-02-21 DIAGNOSIS — N401 Enlarged prostate with lower urinary tract symptoms: Secondary | ICD-10-CM | POA: Diagnosis not present

## 2021-02-21 DIAGNOSIS — E039 Hypothyroidism, unspecified: Secondary | ICD-10-CM | POA: Diagnosis not present

## 2021-02-21 DIAGNOSIS — G4700 Insomnia, unspecified: Secondary | ICD-10-CM | POA: Diagnosis not present

## 2021-02-21 DIAGNOSIS — I2510 Atherosclerotic heart disease of native coronary artery without angina pectoris: Secondary | ICD-10-CM | POA: Diagnosis not present

## 2021-02-21 DIAGNOSIS — E7800 Pure hypercholesterolemia, unspecified: Secondary | ICD-10-CM | POA: Diagnosis not present

## 2021-02-21 DIAGNOSIS — Z794 Long term (current) use of insulin: Secondary | ICD-10-CM

## 2021-02-21 DIAGNOSIS — E1165 Type 2 diabetes mellitus with hyperglycemia: Secondary | ICD-10-CM | POA: Diagnosis not present

## 2021-02-21 DIAGNOSIS — I10 Essential (primary) hypertension: Secondary | ICD-10-CM | POA: Diagnosis not present

## 2021-02-21 DIAGNOSIS — I480 Paroxysmal atrial fibrillation: Secondary | ICD-10-CM | POA: Diagnosis not present

## 2021-02-21 LAB — POCT GLYCOSYLATED HEMOGLOBIN (HGB A1C): Hemoglobin A1C: 7.6 % — AB (ref 4.0–5.6)

## 2021-02-21 NOTE — Progress Notes (Signed)
Subjective:    Patient ID: Christopher Patel, male    DOB: January 29, 1945, 76 y.o.   MRN: [id number]  HPI Pt returns for f/u of diabetes mellitus:  DM type: Insulin-requiring type 2 Dx'ed: 6160 Complications: none Therapy: insulin since 2017, and metformin.   DKA: never.  Severe hypoglycemia: never.   Pancreatitis: never.  Other: he takes human insulin, due to cost; he declines multiple daily injections.   Interval history: He brings a record of his cbg's which I have reviewed today.  cbg's vary from 101-172.  It is in general higher as the day goes on.  pt states he feels well in general.  no recent steroids. Pt says he does not miss meds.  He says BP is normal at home.   Past Medical History:  Diagnosis Date   CAD (coronary artery disease)    Change in voice    Cough    Depression    Diabetes mellitus 04-25-11   oral meds   ED (erectile dysfunction)    Hepatic lesion    left lobe   Hyperlipidemia    Hypertension    dr Marilynn Rail   Hypothyroidism    Lung nodule    Nasal congestion    Osteoarthritis 04-25-11   spine area-some neck issues   Paroxysmal atrial fibrillation ([place])    started on metoprolol and Xarelto 07/02/2015 for afib RVR   Pneumonia    PONV (postoperative nausea and vomiting)    Thyroid disease    thyroid lesion   Tubular adenoma    polyps- Dr.Edwards    Past Surgical History:  Procedure Laterality Date   CARDIAC CATHETERIZATION  04-25-11   many yrs ago-very slight blockage 1 vessel (60% RCA by Dr. Dorthy Cooler notes)   CHOLECYSTECTOMY N/A 08/07/2018   Procedure: LAPAROSCOPIC CHOLECYSTECTOMY;  Surgeon: Mickeal Skinner, MD;  Location: [facility];  Service: General;  Laterality: N/A;   EYE SURGERY  11/2010   cataract x2   EYE SURGERY  12/2010   len implant x2    KNEE SURGERY  04-25-11   right/ left knee scope   THYROIDECTOMY  05/02/2011   Procedure: THYROIDECTOMY;  Surgeon: Earnstine Regal, MD;  Location: [facility];  Service: General;  Laterality: N/A;  Total  Thyroidectomy   TOTAL KNEE ARTHROPLASTY Right 04/25/2013   Procedure: TOTAL KNEE ARTHROPLASTY;  Surgeon: Kerin Salen, MD;  Location: [facility];  Service: Orthopedics;  Laterality: Right;    Social History   Socioeconomic History   Marital status: Widowed    Spouse name: Not on file   Number of children: Not on file   Years of education: Not on file   Highest education level: Not on file  Occupational History   Not on file  Tobacco Use   Smoking status: Former    Years: 50.00    Types: Cigarettes    Quit date: 11/24/2009    Years since quitting: 11.2   Smokeless tobacco: Never  Substance and Sexual Activity   Alcohol use: Yes    Comment: Beer on weekends, 6 or more   Drug use: No   Sexual activity: Never  Other Topics Concern   Not on file  Social History Narrative   Not on file   Social Determinants of Health   Financial Resource Strain: Not on file  Food Insecurity: Not on file  Transportation Needs: Not on file  Physical Activity: Not on file  Stress: Not on file  Social Connections: Not  on file  Intimate Partner Violence: Not on file    Current Outpatient Medications on File Prior to Visit  Medication Sig Dispense Refill   acetaminophen (TYLENOL) 500 MG tablet Take 500 mg by mouth every 6 (six) hours as needed for moderate pain (BACK PAIN).     amLODipine (NORVASC) 10 MG tablet Take 10 mg by mouth daily.     aspirin EC 81 MG tablet Take 81 mg by mouth daily.     B-D INS SYR ULTRAFINE 1CC/30G 30G X 1/2" 1 ML MISC USE TO INJECT INSULIN ONCE DAILY 90 each 2   fexofenadine (ALLEGRA) 180 MG tablet Take 180 mg by mouth daily as needed for allergies.      fluticasone (VERAMYST) 27.5 MCG/SPRAY nasal spray Place 2 sprays into the nose daily as needed for allergies.      hydrochlorothiazide (HYDRODIURIL) 25 MG tablet Take 50 mg by mouth daily.      insulin NPH Human (NOVOLIN N) 100 UNIT/ML injection Inject 0.7 mLs (70 Units total) into the skin in the morning. 70 mL 3    levothyroxine (SYNTHROID, LEVOTHROID) 150 MCG tablet Take 175 mcg by mouth daily before breakfast.     metFORMIN (GLUCOPHAGE) 500 MG tablet Take 1,000 mg by mouth 2 (two) times daily with a meal.     metoprolol tartrate (LOPRESSOR) 25 MG tablet TAKE 1 TABLET TWICE DAILY 180 tablet 3   Multiple Vitamin (MULTIVITAMIN) tablet Take 1 tablet by mouth daily.     pantoprazole (PROTONIX) 40 MG tablet Take 40 mg by mouth daily.     polyethylene glycol (MIRALAX / GLYCOLAX) 17 g packet Take 17 g by mouth daily as needed for mild constipation. 14 each 0   No current facility-administered medications on file prior to visit.    Allergies  Allergen Reactions   Actos [Pioglitazone Hydrochloride] Nausea Only   Glyburide Other (See Comments)    hypoglycemia   Metformin And Related Other (See Comments)    Hypoglycemia, shakes Fast acting metformin   Codeine Itching    Family History  Problem Relation Age of Onset   Diabetes Mother    Diabetes Brother    Colon cancer Brother    COPD Other    Hypertension Other    Hyperlipidemia Other    Heart attack Other    Diabetes Sister     BP (!) 152/74 (BP Location: Left Arm, Patient Position: Sitting, Cuff Size: Normal)    Pulse 62    Ht 5\' 10"  (1.778 m)    Wt 235 lb 9.6 oz (106.9 kg)    SpO2 93%    BMI 33.81 kg/m    Review of Systems     Objective:   Physical Exam  Lab Results  Component Value Date   CREATININE 1.07 08/08/2018   BUN 10 08/08/2018   NA 135 08/08/2018   K 3.7 08/08/2018   CL 97 (L) 08/08/2018   CO2 29 08/08/2018     A1c=7.6%    Assessment & Plan:  Insulin-requiring type 2 DM: this is the best control this pt should aim for, given this regimen, which does match insulin to his changing needs throughout the day   Patient Instructions  Please continue the same insulin and metformin.   On this type of insulin schedule, you should eat meals on a regular schedule.  If a meal is missed or significantly delayed, your blood  sugar could go low.   check your blood sugar twice a day.  vary  the time of day when you check, between before the 3 meals, and at bedtime.  also check if you have symptoms of your blood sugar being too high or too low.  please keep a record of the readings and bring it to your next appointment here (or you can bring the meter itself).  You can write it on any piece of paper.  please call us sooner if your blood sugar goes below 70, or if you have a lot of readings over 200.   Please come back for a follow-up appointment in May.

## 2021-02-21 NOTE — Patient Instructions (Addendum)
Please continue the same insulin and metformin.   On this type of insulin schedule, you should eat meals on a regular schedule.  If a meal is missed or significantly delayed, your blood sugar could go low.   check your blood sugar twice a day.  vary the time of day when you check, between before the 3 meals, and at bedtime.  also check if you have symptoms of your blood sugar being too high or too low.  please keep a record of the readings and bring it to your next appointment here (or you can bring the meter itself).  You can write it on any piece of paper.  please call us sooner if your blood sugar goes below 70, or if you have a lot of readings over 200.   Please come back for a follow-up appointment in May.

## 2021-04-11 DIAGNOSIS — G4733 Obstructive sleep apnea (adult) (pediatric): Secondary | ICD-10-CM | POA: Diagnosis not present

## 2021-04-19 DIAGNOSIS — E7800 Pure hypercholesterolemia, unspecified: Secondary | ICD-10-CM | POA: Diagnosis not present

## 2021-04-19 DIAGNOSIS — Z79899 Other long term (current) drug therapy: Secondary | ICD-10-CM | POA: Diagnosis not present

## 2021-04-19 DIAGNOSIS — E039 Hypothyroidism, unspecified: Secondary | ICD-10-CM | POA: Diagnosis not present

## 2021-04-22 DIAGNOSIS — E1169 Type 2 diabetes mellitus with other specified complication: Secondary | ICD-10-CM | POA: Diagnosis not present

## 2021-04-22 DIAGNOSIS — E039 Hypothyroidism, unspecified: Secondary | ICD-10-CM | POA: Diagnosis not present

## 2021-04-22 DIAGNOSIS — I779 Disorder of arteries and arterioles, unspecified: Secondary | ICD-10-CM | POA: Diagnosis not present

## 2021-04-22 DIAGNOSIS — F5101 Primary insomnia: Secondary | ICD-10-CM | POA: Diagnosis not present

## 2021-04-22 DIAGNOSIS — I10 Essential (primary) hypertension: Secondary | ICD-10-CM | POA: Diagnosis not present

## 2021-04-22 DIAGNOSIS — Z0001 Encounter for general adult medical examination with abnormal findings: Secondary | ICD-10-CM | POA: Diagnosis not present

## 2021-04-22 DIAGNOSIS — Z79899 Other long term (current) drug therapy: Secondary | ICD-10-CM | POA: Diagnosis not present

## 2021-04-22 DIAGNOSIS — I2510 Atherosclerotic heart disease of native coronary artery without angina pectoris: Secondary | ICD-10-CM | POA: Diagnosis not present

## 2021-04-22 DIAGNOSIS — E7800 Pure hypercholesterolemia, unspecified: Secondary | ICD-10-CM | POA: Diagnosis not present

## 2021-06-06 DIAGNOSIS — E1165 Type 2 diabetes mellitus with hyperglycemia: Secondary | ICD-10-CM | POA: Diagnosis not present

## 2021-06-06 DIAGNOSIS — I480 Paroxysmal atrial fibrillation: Secondary | ICD-10-CM | POA: Diagnosis not present

## 2021-06-06 DIAGNOSIS — G4700 Insomnia, unspecified: Secondary | ICD-10-CM | POA: Diagnosis not present

## 2021-06-06 DIAGNOSIS — E1169 Type 2 diabetes mellitus with other specified complication: Secondary | ICD-10-CM | POA: Diagnosis not present

## 2021-06-06 DIAGNOSIS — N401 Enlarged prostate with lower urinary tract symptoms: Secondary | ICD-10-CM | POA: Diagnosis not present

## 2021-06-06 DIAGNOSIS — I2510 Atherosclerotic heart disease of native coronary artery without angina pectoris: Secondary | ICD-10-CM | POA: Diagnosis not present

## 2021-06-06 DIAGNOSIS — E039 Hypothyroidism, unspecified: Secondary | ICD-10-CM | POA: Diagnosis not present

## 2021-06-06 DIAGNOSIS — E7800 Pure hypercholesterolemia, unspecified: Secondary | ICD-10-CM | POA: Diagnosis not present

## 2021-06-06 DIAGNOSIS — I10 Essential (primary) hypertension: Secondary | ICD-10-CM | POA: Diagnosis not present

## 2021-06-09 ENCOUNTER — Ambulatory Visit: Payer: Medicare HMO | Admitting: Endocrinology

## 2021-07-20 DIAGNOSIS — E1165 Type 2 diabetes mellitus with hyperglycemia: Secondary | ICD-10-CM | POA: Diagnosis not present

## 2021-07-20 DIAGNOSIS — E7800 Pure hypercholesterolemia, unspecified: Secondary | ICD-10-CM | POA: Diagnosis not present

## 2021-07-20 DIAGNOSIS — I10 Essential (primary) hypertension: Secondary | ICD-10-CM | POA: Diagnosis not present

## 2021-07-20 DIAGNOSIS — G4700 Insomnia, unspecified: Secondary | ICD-10-CM | POA: Diagnosis not present

## 2021-07-20 DIAGNOSIS — E039 Hypothyroidism, unspecified: Secondary | ICD-10-CM | POA: Diagnosis not present

## 2021-07-22 DIAGNOSIS — E039 Hypothyroidism, unspecified: Secondary | ICD-10-CM | POA: Diagnosis not present

## 2021-07-22 DIAGNOSIS — E7800 Pure hypercholesterolemia, unspecified: Secondary | ICD-10-CM | POA: Diagnosis not present

## 2021-07-27 DIAGNOSIS — E039 Hypothyroidism, unspecified: Secondary | ICD-10-CM | POA: Diagnosis not present

## 2021-07-27 DIAGNOSIS — I10 Essential (primary) hypertension: Secondary | ICD-10-CM | POA: Diagnosis not present

## 2021-07-27 DIAGNOSIS — E1169 Type 2 diabetes mellitus with other specified complication: Secondary | ICD-10-CM | POA: Diagnosis not present

## 2021-07-27 DIAGNOSIS — Z79899 Other long term (current) drug therapy: Secondary | ICD-10-CM | POA: Diagnosis not present

## 2021-07-27 DIAGNOSIS — M519 Unspecified thoracic, thoracolumbar and lumbosacral intervertebral disc disorder: Secondary | ICD-10-CM | POA: Diagnosis not present

## 2021-07-27 DIAGNOSIS — N401 Enlarged prostate with lower urinary tract symptoms: Secondary | ICD-10-CM | POA: Diagnosis not present

## 2021-07-27 DIAGNOSIS — E7800 Pure hypercholesterolemia, unspecified: Secondary | ICD-10-CM | POA: Diagnosis not present

## 2021-09-12 DIAGNOSIS — M47816 Spondylosis without myelopathy or radiculopathy, lumbar region: Secondary | ICD-10-CM | POA: Diagnosis not present

## 2021-09-23 DIAGNOSIS — R3912 Poor urinary stream: Secondary | ICD-10-CM | POA: Diagnosis not present

## 2021-09-23 DIAGNOSIS — R351 Nocturia: Secondary | ICD-10-CM | POA: Diagnosis not present

## 2021-09-23 DIAGNOSIS — Z125 Encounter for screening for malignant neoplasm of prostate: Secondary | ICD-10-CM | POA: Diagnosis not present

## 2021-09-23 DIAGNOSIS — R3911 Hesitancy of micturition: Secondary | ICD-10-CM | POA: Diagnosis not present

## 2021-09-23 DIAGNOSIS — N401 Enlarged prostate with lower urinary tract symptoms: Secondary | ICD-10-CM | POA: Diagnosis not present

## 2021-09-27 DIAGNOSIS — M47816 Spondylosis without myelopathy or radiculopathy, lumbar region: Secondary | ICD-10-CM | POA: Diagnosis not present

## 2021-10-05 ENCOUNTER — Other Ambulatory Visit: Payer: Self-pay | Admitting: Endocrinology

## 2021-10-05 ENCOUNTER — Telehealth: Payer: Self-pay

## 2021-10-05 DIAGNOSIS — Z794 Long term (current) use of insulin: Secondary | ICD-10-CM

## 2021-10-05 NOTE — Telephone Encounter (Signed)
Patient has a lab appt tomorrow and needs labs ordered.

## 2021-10-06 ENCOUNTER — Other Ambulatory Visit (INDEPENDENT_AMBULATORY_CARE_PROVIDER_SITE_OTHER): Payer: Medicare HMO

## 2021-10-06 DIAGNOSIS — E119 Type 2 diabetes mellitus without complications: Secondary | ICD-10-CM

## 2021-10-06 DIAGNOSIS — Z794 Long term (current) use of insulin: Secondary | ICD-10-CM | POA: Diagnosis not present

## 2021-10-06 LAB — COMPREHENSIVE METABOLIC PANEL
ALT: 28 U/L (ref 0–53)
AST: 19 U/L (ref 0–37)
Albumin: 4 g/dL (ref 3.5–5.2)
Alkaline Phosphatase: 57 U/L (ref 39–117)
BUN: 17 mg/dL (ref 6–23)
CO2: 31 mEq/L (ref 19–32)
Calcium: 8.9 mg/dL (ref 8.4–10.5)
Chloride: 93 mEq/L — ABNORMAL LOW (ref 96–112)
Creatinine, Ser: 1.21 mg/dL (ref 0.40–1.50)
GFR: 58.97 mL/min — ABNORMAL LOW (ref >60.00)
Glucose, Bld: 222 mg/dL — ABNORMAL HIGH (ref 70–99)
Potassium: 5 mEq/L (ref 3.5–5.1)
Sodium: 130 mEq/L — ABNORMAL LOW (ref 135–145)
Total Bilirubin: 0.5 mg/dL (ref 0.2–1.2)
Total Protein: 6.9 g/dL (ref 6.0–8.3)

## 2021-10-06 LAB — LIPID PANEL
Cholesterol: 157 mg/dL (ref 0–200)
HDL: 56.5 mg/dL (ref >39.00)
LDL Cholesterol: 69 mg/dL (ref 0–99)
NonHDL: 100.78
Total CHOL/HDL Ratio: 3
Triglycerides: 157 mg/dL — ABNORMAL HIGH (ref 0.0–149.0)
VLDL: 31.4 mg/dL (ref 0.0–40.0)

## 2021-10-06 LAB — MICROALBUMIN / CREATININE URINE RATIO
Creatinine,U: 177.6 mg/dL
Microalb Creat Ratio: 1.7 mg/g (ref 0.0–30.0)
Microalb, Ur: 2.9 mg/dL — ABNORMAL HIGH (ref 0.0–1.9)

## 2021-10-06 LAB — HEMOGLOBIN A1C: Hgb A1c MFr Bld: 7.7 % — ABNORMAL HIGH (ref 4.6–6.5)

## 2021-10-10 ENCOUNTER — Ambulatory Visit: Payer: Medicare HMO | Admitting: Endocrinology

## 2021-10-16 NOTE — Progress Notes (Unsigned)
Patient ID: Christopher Patel, male   DOB: 22-Jun-1945, 75 y.o.   MRN: [id number]           Reason for Appointment: Type II Diabetes follow-up   History of Present Illness   Diagnosis date: 2012  Previous history:  Non-insulin hypoglycemic drugs previously used: Metformin Insulin was started in 2017 and has mostly been on NPH insulin  A1c range in the last few years is: 6.5-8%  Recent history:     Non-insulin hypoglycemic drugs: Metformin 1 g twice daily     Insulin regimen:    NPH 70 units daily, after breakfast    Side effects from medications: None  Current self management, blood sugar patterns and problems identified:  A1c is 7.7, previously 7.6 He did not bring his meter or home diary of blood sugars for review today He only checks blood sugars fasting and he thinks they are fairly consistent around 120 Does not check readings after meals However his glucose in the lab was 222 after breakfast He has no meal plan to follow Recently has been able to lose some weight Currently using a syringe and vial for his insulin and he does not think he can afford an insulin pen  Exercise: Unable to do much     Hypoglycemia:  none    Glucometer: One Touch.           Blood Glucose readings from recall:   PRE-MEAL Fasting Lunch Dinner Bedtime Overall  Glucose range: 115-125      Mean/median:        POST-MEAL PC Breakfast PC Lunch PC Dinner  Glucose range:     Mean/median:       Dietician visit: Most recent: Years ago     Weight control:  Wt Readings from Last 3 Encounters:  10/17/21 225 lb (102.1 kg)  02/21/21 235 lb 9.6 oz (106.9 kg)  10/21/20 234 lb 6.4 oz (106.3 kg)            Diabetes labs:  Lab Results  Component Value Date   HGBA1C 7.7 (H) 10/06/2021   HGBA1C 7.6 (A) 02/21/2021   HGBA1C 7.7 (A) 10/21/2020   Lab Results  Component Value Date   MICROALBUR 2.9 (H) 10/06/2021   LDLCALC 69 10/06/2021   CREATININE 1.21 10/06/2021    No results found  for: "FRUCTOSAMINE"   Allergies as of 10/17/2021       Reactions   Actos [pioglitazone Hydrochloride] Nausea Only   Glyburide Other (See Comments)   hypoglycemia   Metformin And Related Other (See Comments)   Hypoglycemia, shakes Fast acting metformin   Codeine Itching        Medication List        Accurate as of October 17, 2021  8:45 PM. If you have any questions, ask your nurse or doctor.          acetaminophen 500 MG tablet Commonly known as: TYLENOL Take 500 mg by mouth every 6 (six) hours as needed for moderate pain (BACK PAIN).   amLODipine 10 MG tablet Commonly known as: NORVASC Take 10 mg by mouth daily.   aspirin EC 81 MG tablet Take 81 mg by mouth daily.   B-D INS SYR ULTRAFINE 1CC/30G 30G X 1/2" 1 ML Misc Generic drug: Insulin Syringe-Needle U-100 USE TO INJECT INSULIN ONCE DAILY   Dexcom G7 Receiver Kerrin Mo To display CGM data Started by: Elayne Snare, MD   Dexcom G7 Sensor Misc 1 Device by Does not apply route  as directed. Change sensor every 10 days Started by: Elayne Snare, MD   fexofenadine 180 MG tablet Commonly known as: ALLEGRA Take 180 mg by mouth daily as needed for allergies.   fluticasone 27.5 MCG/SPRAY nasal spray Commonly known as: VERAMYST Place 2 sprays into the nose daily as needed for allergies.   gabapentin 300 MG capsule Commonly known as: NEURONTIN Take 1 capsule (300 mg total) by mouth 3 (three) times daily. Started by: Elayne Snare, MD   hydrochlorothiazide 25 MG tablet Commonly known as: HYDRODIURIL Take 50 mg by mouth daily.   insulin NPH Human 100 UNIT/ML injection Commonly known as: NovoLIN N Inject 0.7 mLs (70 Units total) into the skin in the morning.   insulin regular 100 units/mL injection Commonly known as: NovoLIN R Inject 0.1 mLs (10 Units total) into the skin 3 (three) times daily before meals. Started by: Elayne Snare, MD   levothyroxine 150 MCG tablet Commonly known as: SYNTHROID Take 175 mcg by mouth  daily before breakfast.   metFORMIN 500 MG tablet Commonly known as: GLUCOPHAGE Take 1,000 mg by mouth 2 (two) times daily with a meal.   metoprolol tartrate 25 MG tablet Commonly known as: LOPRESSOR TAKE 1 TABLET TWICE DAILY   multivitamin tablet Take 1 tablet by mouth daily.   pantoprazole 40 MG tablet Commonly known as: PROTONIX Take 40 mg by mouth daily.   polyethylene glycol 17 g packet Commonly known as: MIRALAX / GLYCOLAX Take 17 g by mouth daily as needed for mild constipation.        Allergies:  Allergies  Allergen Reactions   Actos [Pioglitazone Hydrochloride] Nausea Only   Glyburide Other (See Comments)    hypoglycemia   Metformin And Related Other (See Comments)    Hypoglycemia, shakes Fast acting metformin   Codeine Itching    Past Medical History:  Diagnosis Date   CAD (coronary artery disease)    Change in voice    Cough    Depression    Diabetes mellitus 04-25-11   oral meds   ED (erectile dysfunction)    Hepatic lesion    left lobe   Hyperlipidemia    Hypertension    dr Marilynn Rail   Hypothyroidism    Lung nodule    Nasal congestion    Osteoarthritis 04-25-11   spine area-some neck issues   Paroxysmal atrial fibrillation ([place])    started on metoprolol and Xarelto 07/02/2015 for afib RVR   Pneumonia    PONV (postoperative nausea and vomiting)    Thyroid disease    thyroid lesion   Tubular adenoma    polyps- Dr.Edwards    Past Surgical History:  Procedure Laterality Date   CARDIAC CATHETERIZATION  04-25-11   many yrs ago-very slight blockage 1 vessel (60% RCA by Dr. Dorthy Cooler notes)   CHOLECYSTECTOMY N/A 08/07/2018   Procedure: LAPAROSCOPIC CHOLECYSTECTOMY;  Surgeon: Mickeal Skinner, MD;  Location: [facility];  Service: General;  Laterality: N/A;   EYE SURGERY  11/2010   cataract x2   EYE SURGERY  12/2010   len implant x2    KNEE SURGERY  04-25-11   right/ left knee scope   THYROIDECTOMY  05/02/2011   Procedure: THYROIDECTOMY;   Surgeon: Earnstine Regal, MD;  Location: [facility];  Service: General;  Laterality: N/A;  Total Thyroidectomy   TOTAL KNEE ARTHROPLASTY Right 04/25/2013   Procedure: TOTAL KNEE ARTHROPLASTY;  Surgeon: Kerin Salen, MD;  Location: [facility];  Service: Orthopedics;  Laterality:  Right;    Family History  Problem Relation Age of Onset   Diabetes Mother    Diabetes Brother    Colon cancer Brother    COPD Other    Hypertension Other    Hyperlipidemia Other    Heart attack Other    Diabetes Sister     Social History:  reports that he quit smoking about 11 years ago. His smoking use included cigarettes. He has never used smokeless tobacco. He reports current alcohol use. He reports that he does not use drugs.  Review of Systems:  Last diabetic eye exam date 9/22  Last urine microalbumin date: 9/23  Last foot exam date: 9/23  Symptoms of neuropathy: parasthesiae in fingertips and toes, not affecting sleep He had previously taking gabapentin for other reasons, not now  Hypertension: Treated by PCP ACE/ARB medication: None  BP Readings from Last 3 Encounters:  10/17/21 (!) 160/70  02/21/21 (!) 152/74  10/21/20 (!) 150/56   Lab Results  Component Value Date   CREATININE 1.21 10/06/2021   BUN 17 10/06/2021   NA 130 (L) 10/06/2021   K 5.0 10/06/2021   CL 93 (L) 10/06/2021   CO2 31 10/06/2021  ' Lipid management: Followed by PCP    Lab Results  Component Value Date   CHOL 157 10/06/2021   Lab Results  Component Value Date   HDL 56.50 10/06/2021   Lab Results  Component Value Date   LDLCALC 69 10/06/2021   Lab Results  Component Value Date   TRIG 157.0 (H) 10/06/2021   Lab Results  Component Value Date   CHOLHDL 3 10/06/2021   No results found for: "LDLDIRECT"          Fib-4 interpretation is not validated for people under 34 or over 63 years of age.  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   Diabetic Foot Exam - Simple   Simple Foot Form Diabetic Foot exam was performed with the following findings: Yes 10/17/2021  4:43 PM  Visual Inspection No deformities, no ulcerations, no other skin breakdown bilaterally: Yes Sensation Testing Intact to touch and monofilament testing bilaterally: Yes Pulse Check Posterior Tibialis and Dorsalis pulse intact bilaterally: Yes Comments     ASSESSMENT/ PLAN:    Diabetes type 2: Insulin requiring  Current regimen: Metformin and NPH insulin once a day  See history of present illness for detailed discussion of current diabetes management, blood sugar patterns and problems identified  A1c is 7.7 and about the same as before His blood sugars appear to be high after meals since lab glucose was 222 after breakfast He does not check blood sugars much after meals and does not know how high they are especially after dinner He is not able to exercise and lost weight and diet needs to be assessed by nutritionist, also needs more diabetes education regarding insulin Explained the need for monitoring blood sugars after meals since his A1c is high   Recommendations:  Start checking blood sugars alternating fasting and after meals, currently has adequate supply of test trips He is a good candidate for starting a CGM to help manage his insulin especially monitoring after meals Discussed various options  and he is agreeable to trying the Dexcom sensor which will be accurate and allow him to use any site for application Prescription was sent to [facility] and he will call if he does not get contacted in 1 week, patient information brochure given on the Dexcom Today discussed in detail the need for mealtime insulin to cover postprandial spikes, duration of action of mealtime insulin timing and time  course of the regular insulin as well as starting dose and dosage titration to target the two-hour reading of under 180 He will likely need to start with a smaller dose of 6 to 8 units and given him written instructions on increasing it by 2 units if his postprandial readings are consistently over 180 after a given meal Likely does not need coverage for lunch since he is on NPH but will review further as information is available on his monitoring To avoid overnight hypoglycemia he will reduce his NPH to 60 units instead of 70 Follow-up with diabetes educator for comprehensive education and also any help with starting CGM also  NEUROPATHY: He will take gabapentin as needed, given 300 mg prescription  Patient Instructions  Check blood sugars on waking up 3 days a week  Also check blood sugars about 2 hours after meals and do this after different meals by rotation  Recommended blood sugar levels on waking up are 90-130 and about 2 hours after meal is 130-160  Please bring your blood sugar monitor to each visit, thank you  Take insulin am and pm as directed    Elayne Snare 10/17/2021, 8:45 PM

## 2021-10-17 ENCOUNTER — Encounter: Payer: Self-pay | Admitting: Endocrinology

## 2021-10-17 ENCOUNTER — Ambulatory Visit: Payer: Medicare HMO | Admitting: Endocrinology

## 2021-10-17 VITALS — BP 160/70 | HR 68 | Ht 69.0 in | Wt 225.0 lb

## 2021-10-17 DIAGNOSIS — I10 Essential (primary) hypertension: Secondary | ICD-10-CM

## 2021-10-17 DIAGNOSIS — Z794 Long term (current) use of insulin: Secondary | ICD-10-CM | POA: Diagnosis not present

## 2021-10-17 DIAGNOSIS — E1165 Type 2 diabetes mellitus with hyperglycemia: Secondary | ICD-10-CM | POA: Diagnosis not present

## 2021-10-17 MED ORDER — INSULIN REGULAR HUMAN 100 UNIT/ML IJ SOLN: 10.0000 [IU] | Freq: Three times a day (TID) | INTRAMUSCULAR | 1 refills | Status: AC

## 2021-10-17 MED ORDER — DEXCOM G7 SENSOR MISC: 1.0000 | 3 refills | Status: AC

## 2021-10-17 MED ORDER — GABAPENTIN 300 MG PO CAPS: 300.0000 mg | ORAL_CAPSULE | Freq: Three times a day (TID) | ORAL | 3 refills | Status: DC

## 2021-10-17 MED ORDER — DEXCOM G7 RECEIVER DEVI: 0 refills | Status: AC

## 2021-10-17 NOTE — Patient Instructions (Addendum)
Check blood sugars on waking up 3 days a week  Also check blood sugars about 2 hours after meals and do this after different meals by rotation  Recommended blood sugar levels on waking up are 90-130 and about 2 hours after meal is 130-160  Please bring your blood sugar monitor to each visit, thank you  Take insulin am and pm as directed

## 2021-10-27 DIAGNOSIS — H52223 Regular astigmatism, bilateral: Secondary | ICD-10-CM | POA: Diagnosis not present

## 2021-10-31 LAB — HM DIABETES EYE EXAM

## 2021-11-03 DIAGNOSIS — L720 Epidermal cyst: Secondary | ICD-10-CM | POA: Diagnosis not present

## 2021-11-14 ENCOUNTER — Encounter: Payer: Self-pay | Admitting: Endocrinology

## 2021-11-14 ENCOUNTER — Other Ambulatory Visit: Payer: Self-pay

## 2021-11-14 DIAGNOSIS — E119 Type 2 diabetes mellitus without complications: Secondary | ICD-10-CM

## 2021-11-14 DIAGNOSIS — Z794 Long term (current) use of insulin: Secondary | ICD-10-CM

## 2021-11-14 MED ORDER — INSULIN NPH (HUMAN) (ISOPHANE) 100 UNIT/ML ~~LOC~~ SUSP: 70.0000 [IU] | Freq: Every morning | SUBCUTANEOUS | 3 refills | Status: DC

## 2021-11-21 DIAGNOSIS — Z23 Encounter for immunization: Secondary | ICD-10-CM | POA: Diagnosis not present

## 2021-12-02 ENCOUNTER — Other Ambulatory Visit: Payer: Self-pay

## 2021-12-08 ENCOUNTER — Telehealth: Payer: Self-pay | Admitting: Endocrinology

## 2021-12-08 NOTE — Telephone Encounter (Signed)
MEDICATION:  gabapentin gabapentin (NEURONTIN) 300 MG capsule  PHARMACY:    [facility] [address] [place] [address] (Ph: [phone])    HAS THE PATIENT CONTACTED THEIR PHARMACY?  Yes  IS THIS A 90 DAY SUPPLY : Yes  IS PATIENT OUT OF MEDICATION: No  IF NOT; HOW MUCH IS LEFT: Patient would not say.  He just said he needs refill  LAST APPOINTMENT DATE: @ 10/17/2021  NEXT APPOINTMENT DATE:@ Not scheduled  DO WE HAVE YOUR PERMISSION TO LEAVE A DETAILED MESSAGE?: Yes  OTHER COMMENTS: Patient states that he is very upset because he has left 2 messages and no one has called him back yet.  He states that [facility] says that they have never received the prescription.     **Let patient know to contact pharmacy at the end of the day to make sure medication is ready. **  ** Please notify patient to allow 48-72 hours to process**  **Encourage patient to contact the pharmacy for refills or they can request refills through [facility]**

## 2021-12-08 NOTE — Telephone Encounter (Signed)
Pt contacted and advised rx was at pharmacy and they will contact him when ready for pick up.

## 2022-01-25 DIAGNOSIS — Z79899 Other long term (current) drug therapy: Secondary | ICD-10-CM | POA: Diagnosis not present

## 2022-03-24 DIAGNOSIS — E1142 Type 2 diabetes mellitus with diabetic polyneuropathy: Secondary | ICD-10-CM | POA: Diagnosis not present

## 2022-03-24 DIAGNOSIS — E039 Hypothyroidism, unspecified: Secondary | ICD-10-CM | POA: Diagnosis not present

## 2022-03-24 DIAGNOSIS — I2510 Atherosclerotic heart disease of native coronary artery without angina pectoris: Secondary | ICD-10-CM | POA: Diagnosis not present

## 2022-03-24 DIAGNOSIS — I10 Essential (primary) hypertension: Secondary | ICD-10-CM | POA: Diagnosis not present

## 2022-03-24 DIAGNOSIS — Z79899 Other long term (current) drug therapy: Secondary | ICD-10-CM | POA: Diagnosis not present

## 2022-03-24 DIAGNOSIS — I700 Atherosclerosis of aorta: Secondary | ICD-10-CM | POA: Diagnosis not present

## 2022-03-24 DIAGNOSIS — E7800 Pure hypercholesterolemia, unspecified: Secondary | ICD-10-CM | POA: Diagnosis not present

## 2022-03-24 DIAGNOSIS — F5101 Primary insomnia: Secondary | ICD-10-CM | POA: Diagnosis not present

## 2022-03-24 DIAGNOSIS — Z794 Long term (current) use of insulin: Secondary | ICD-10-CM | POA: Diagnosis not present

## 2022-03-24 DIAGNOSIS — E1169 Type 2 diabetes mellitus with other specified complication: Secondary | ICD-10-CM | POA: Diagnosis not present

## 2022-04-10 DIAGNOSIS — M47816 Spondylosis without myelopathy or radiculopathy, lumbar region: Secondary | ICD-10-CM | POA: Diagnosis not present

## 2022-04-17 DIAGNOSIS — G4733 Obstructive sleep apnea (adult) (pediatric): Secondary | ICD-10-CM | POA: Diagnosis not present

## 2022-04-17 DIAGNOSIS — I10 Essential (primary) hypertension: Secondary | ICD-10-CM | POA: Diagnosis not present

## 2022-04-17 DIAGNOSIS — E1169 Type 2 diabetes mellitus with other specified complication: Secondary | ICD-10-CM | POA: Diagnosis not present

## 2022-05-01 ENCOUNTER — Encounter: Payer: Self-pay | Admitting: Physical Medicine & Rehabilitation

## 2022-05-02 DIAGNOSIS — M47816 Spondylosis without myelopathy or radiculopathy, lumbar region: Secondary | ICD-10-CM | POA: Diagnosis not present

## 2022-05-16 DIAGNOSIS — M47816 Spondylosis without myelopathy or radiculopathy, lumbar region: Secondary | ICD-10-CM | POA: Diagnosis not present

## 2022-05-29 DIAGNOSIS — M47816 Spondylosis without myelopathy or radiculopathy, lumbar region: Secondary | ICD-10-CM | POA: Diagnosis not present

## 2022-06-02 DIAGNOSIS — Z79899 Other long term (current) drug therapy: Secondary | ICD-10-CM | POA: Diagnosis not present

## 2022-06-02 DIAGNOSIS — E7800 Pure hypercholesterolemia, unspecified: Secondary | ICD-10-CM | POA: Diagnosis not present

## 2022-06-02 DIAGNOSIS — E039 Hypothyroidism, unspecified: Secondary | ICD-10-CM | POA: Diagnosis not present

## 2022-06-02 DIAGNOSIS — F5101 Primary insomnia: Secondary | ICD-10-CM | POA: Diagnosis not present

## 2022-06-02 DIAGNOSIS — I10 Essential (primary) hypertension: Secondary | ICD-10-CM | POA: Diagnosis not present

## 2022-06-02 DIAGNOSIS — Z0001 Encounter for general adult medical examination with abnormal findings: Secondary | ICD-10-CM | POA: Diagnosis not present

## 2022-06-02 DIAGNOSIS — Z794 Long term (current) use of insulin: Secondary | ICD-10-CM | POA: Diagnosis not present

## 2022-06-02 DIAGNOSIS — E1142 Type 2 diabetes mellitus with diabetic polyneuropathy: Secondary | ICD-10-CM | POA: Diagnosis not present

## 2022-06-02 DIAGNOSIS — I2510 Atherosclerotic heart disease of native coronary artery without angina pectoris: Secondary | ICD-10-CM | POA: Diagnosis not present

## 2022-06-06 ENCOUNTER — Encounter: Payer: Self-pay | Admitting: Physical Medicine & Rehabilitation

## 2022-06-06 ENCOUNTER — Encounter: Payer: Medicare HMO | Attending: Physical Medicine & Rehabilitation | Admitting: Physical Medicine & Rehabilitation

## 2022-06-06 VITALS — BP 150/76 | HR 66 | Ht 69.0 in | Wt 237.0 lb

## 2022-06-06 DIAGNOSIS — R200 Anesthesia of skin: Secondary | ICD-10-CM | POA: Insufficient documentation

## 2022-06-06 DIAGNOSIS — R202 Paresthesia of skin: Secondary | ICD-10-CM | POA: Insufficient documentation

## 2022-06-06 NOTE — Progress Notes (Signed)
77 year old diabetic male referred by primary care for lower extremity EMG/NCV.  The patient is on gabapentin for neuropathic pain. EMG/NCV report is filed under media tab, copy will be sent to referring physician. Briefly absent bilateral sural sensory as well as superficial fibular sensory conductions.  Absent bilateral tibial motor and fibular motor conductions.  Evidence of denervation primarily affecting distal S1 innervated musculature. No evidence of denervation of proximal S1 musculature.  Lumbar paraspinals equivocal at L5-S1 due to poor relaxation. 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

## 2022-06-15 ENCOUNTER — Encounter: Payer: Self-pay | Admitting: Physical Medicine & Rehabilitation

## 2022-06-21 DIAGNOSIS — E875 Hyperkalemia: Secondary | ICD-10-CM | POA: Diagnosis not present

## 2022-07-06 DIAGNOSIS — M47816 Spondylosis without myelopathy or radiculopathy, lumbar region: Secondary | ICD-10-CM | POA: Diagnosis not present

## 2022-07-06 DIAGNOSIS — M47817 Spondylosis without myelopathy or radiculopathy, lumbosacral region: Secondary | ICD-10-CM | POA: Diagnosis not present

## 2022-07-10 DIAGNOSIS — E875 Hyperkalemia: Secondary | ICD-10-CM | POA: Diagnosis not present

## 2022-08-07 DIAGNOSIS — R0602 Shortness of breath: Secondary | ICD-10-CM | POA: Diagnosis not present

## 2022-08-08 ENCOUNTER — Encounter: Payer: Self-pay | Admitting: Family Medicine

## 2022-08-08 ENCOUNTER — Ambulatory Visit (HOSPITAL_COMMUNITY)
Admission: RE | Admit: 2022-08-08 | Discharge: 2022-08-08 | Disposition: A | Discharge status: Home / Self Care | Payer: Medicare HMO | Source: Ambulatory Visit | Attending: Family Medicine | Admitting: Family Medicine

## 2022-08-08 ENCOUNTER — Other Ambulatory Visit (HOSPITAL_COMMUNITY): Payer: Self-pay | Admitting: Family Medicine

## 2022-08-08 DIAGNOSIS — R7989 Other specified abnormal findings of blood chemistry: Secondary | ICD-10-CM | POA: Insufficient documentation

## 2022-08-08 DIAGNOSIS — J9811 Atelectasis: Secondary | ICD-10-CM | POA: Diagnosis not present

## 2022-08-08 DIAGNOSIS — J42 Unspecified chronic bronchitis: Secondary | ICD-10-CM | POA: Diagnosis not present

## 2022-08-08 DIAGNOSIS — R0609 Other forms of dyspnea: Secondary | ICD-10-CM | POA: Diagnosis not present

## 2022-08-08 DIAGNOSIS — R918 Other nonspecific abnormal finding of lung field: Secondary | ICD-10-CM | POA: Diagnosis not present

## 2022-08-08 DIAGNOSIS — R0602 Shortness of breath: Secondary | ICD-10-CM | POA: Diagnosis not present

## 2022-08-08 LAB — POCT I-STAT CREATININE: Creatinine, Ser: 1.3 mg/dL — ABNORMAL HIGH (ref 0.61–1.24)

## 2022-08-08 MED ORDER — IOHEXOL 350 MG/ML SOLN
75.0000 mL | Freq: Once | INTRAVENOUS | Status: AC | PRN
  Administered 2022-08-08: 75 mL via INTRAVENOUS

## 2022-08-15 ENCOUNTER — Other Ambulatory Visit (HOSPITAL_COMMUNITY): Payer: Self-pay | Admitting: Family Medicine

## 2022-08-15 DIAGNOSIS — R0602 Shortness of breath: Secondary | ICD-10-CM

## 2022-08-18 ENCOUNTER — Ambulatory Visit (HOSPITAL_COMMUNITY): Payer: Medicare HMO | Attending: Cardiovascular Disease

## 2022-08-18 DIAGNOSIS — R0602 Shortness of breath: Secondary | ICD-10-CM | POA: Insufficient documentation

## 2022-08-21 ENCOUNTER — Other Ambulatory Visit: Payer: Self-pay | Admitting: Endocrinology

## 2022-08-21 DIAGNOSIS — E119 Type 2 diabetes mellitus without complications: Secondary | ICD-10-CM

## 2022-09-11 DIAGNOSIS — E1169 Type 2 diabetes mellitus with other specified complication: Secondary | ICD-10-CM | POA: Diagnosis not present

## 2022-09-27 ENCOUNTER — Encounter: Payer: Self-pay | Admitting: Internal Medicine

## 2022-09-27 ENCOUNTER — Telehealth: Payer: Self-pay | Admitting: Internal Medicine

## 2022-09-27 ENCOUNTER — Ambulatory Visit: Payer: Medicare HMO | Attending: Internal Medicine | Admitting: Internal Medicine

## 2022-09-27 VITALS — BP 136/70 | HR 79 | Ht 70.0 in | Wt 236.0 lb

## 2022-09-27 DIAGNOSIS — R0602 Shortness of breath: Secondary | ICD-10-CM | POA: Diagnosis not present

## 2022-09-27 DIAGNOSIS — R0609 Other forms of dyspnea: Secondary | ICD-10-CM | POA: Insufficient documentation

## 2022-09-27 MED ORDER — TORSEMIDE 20 MG PO TABS: 20.0000 mg | ORAL_TABLET | Freq: Every day | ORAL | 3 refills | Status: DC

## 2022-09-27 NOTE — Telephone Encounter (Signed)
*  STAT* If patient is at the pharmacy, call can be transferred to refill team.   1. Which medications need to be refilled? (please list name of each medication and dose if known)   torsemide (DEMADEX) 20 MG tablet      4. Which pharmacy/location (including street and city if local pharmacy) is medication to be sent to? [id number] - [place], [place] - [address] [place] [phone] [place] [address]     5. Do they need a 30 day or 90 day supply? 90   Pt wants med sent to [facility] instead

## 2022-09-27 NOTE — Telephone Encounter (Signed)
Medication sent to [facility] pharmacy.

## 2022-09-27 NOTE — Patient Instructions (Signed)
Medication Instructions:  Your physician has recommended you make the following change in your medication:   -Hold Amlodipine for 2 weeks- call us in 2 weeks -Start Torsemide 20 mg tablet once daily  *If you need a refill on your cardiac medications before your next appointment, please call your pharmacy*   Lab Work: BMET- in 5 days  If you have labs (blood work) drawn today and your tests are completely normal, you will receive your results only by: MyChart Message (if you have MyChart) OR A paper copy in the mail If you have any lab test that is abnormal or we need to change your treatment, we will call you to review the results.   Testing/Procedures: Your physician has recommended that you have a pulmonary function test. Pulmonary Function Tests are a group of tests that measure how well air moves in and out of your lungs.    Follow-Up: At [facility], you and your health needs are our priority.  As part of our continuing mission to provide you with exceptional heart care, we have created designated Provider Care Teams.  These Care Teams include your primary Cardiologist (physician) and Advanced Practice Providers (APPs -  Physician Assistants and Nurse Practitioners) who all work together to provide you with the care you need, when you need it.  We recommend signing up for the patient portal called "MyChart".  Sign up information is provided on this After Visit Summary.  MyChart is used to connect with patients for Virtual Visits (Telemedicine).  Patients are able to view lab/test results, encounter notes, upcoming appointments, etc.  Non-urgent messages can be sent to your provider as well.   To learn more about what you can do with MyChart, go to [email or web address].    Your next appointment:   6 week(s)  Provider:   You may see Vishnu P Mallipeddi, MD or one of the following Advanced Practice Providers on your designated Care Team:   [place], PA-C   Jacolyn Reedy, [place]      Other Instructions

## 2022-09-27 NOTE — Progress Notes (Signed)
Cardiology Office Note  Date: 09/27/2022   ID: Christopher Patel, DOB [birth year], MRN [id number]  PCP:  Darrow Bussing, MD  Cardiologist:  Marjo Bicker, MD Electrophysiologist:  None   History of Present Illness: Christopher Patel is a 77 y.o. male known to have HTN, OSA on CPAP was referred to cardiology clinic for evaluation of DOE.   Ongoing DOE x 1 year, worsening recently.  No orthopnea or PND, wears CPAP at night but sometimes has difficulty breathing at night.  No angina, dizziness, presyncope, syncope, leg swelling.  No palpitations.  No prior ischemia evaluation.  Denies smoking cigarettes, quit more than 10 years ago.  Past Medical History:  Diagnosis Date   CAD (coronary artery disease)    Change in voice    Cough    Depression    Diabetes mellitus 04-25-11   oral meds   ED (erectile dysfunction)    Hepatic lesion    left lobe   Hyperlipidemia    Hypertension    dr Lillette Boxer   Hypothyroidism    Lung nodule    Nasal congestion    Osteoarthritis 04-25-11   spine area-some neck issues   Paroxysmal atrial fibrillation (HCC)    started on metoprolol and Xarelto 07/02/2015 for afib RVR   Pneumonia    PONV (postoperative nausea and vomiting)    Thyroid disease    thyroid lesion   Tubular adenoma    polyps- Dr.Edwards    Past Surgical History:  Procedure Laterality Date   CARDIAC CATHETERIZATION  04-25-11   many yrs ago-very slight blockage 1 vessel (60% RCA by Dr. Docia Chuck notes)   CHOLECYSTECTOMY N/A 08/07/2018   Procedure: LAPAROSCOPIC CHOLECYSTECTOMY;  Surgeon: Rodman Pickle, MD;  Location: [facility];  Service: General;  Laterality: N/A;   EYE SURGERY  11/2010   cataract x2   EYE SURGERY  12/2010   len implant x2    KNEE SURGERY  04-25-11   right/ left knee scope   THYROIDECTOMY  05/02/2011   Procedure: THYROIDECTOMY;  Surgeon: Velora Heckler, MD;  Location: [facility];  Service: General;  Laterality: N/A;  Total Thyroidectomy   TOTAL KNEE  ARTHROPLASTY Right 04/25/2013   Procedure: TOTAL KNEE ARTHROPLASTY;  Surgeon: Nestor Lewandowsky, MD;  Location: [facility];  Service: Orthopedics;  Laterality: Right;    Current Outpatient Medications  Medication Sig Dispense Refill   ACCU-CHEK GUIDE test strip      Accu-Chek Softclix Lancets lancets      acetaminophen (TYLENOL) 500 MG tablet Take 500 mg by mouth every 6 (six) hours as needed for moderate pain (BACK PAIN).     amLODipine (NORVASC) 10 MG tablet Take 10 mg by mouth daily.     aspirin EC 81 MG tablet Take 81 mg by mouth daily.     B-D INS SYR ULTRAFINE 1CC/30G 30G X 1/2" 1 ML MISC USE TO INJECT INSULIN ONCE DAILY 90 each 2   chlorthalidone (HYGROTON) 25 MG tablet Take 25 mg by mouth daily.     Continuous Blood Gluc Receiver (DEXCOM G7 RECEIVER) DEVI To display CGM data 1 each 0   Continuous Blood Gluc Sensor (DEXCOM G7 SENSOR) MISC 1 Device by Does not apply route as directed. Change sensor every 10 days 3 each 3   fexofenadine (ALLEGRA) 180 MG tablet Take 180 mg by mouth daily as needed for allergies.      fluticasone (VERAMYST) 27.5 MCG/SPRAY nasal spray Place 2 sprays into the  nose daily as needed for allergies.      gabapentin (NEURONTIN) 300 MG capsule Take 1 capsule (300 mg total) by mouth 3 (three) times daily. 90 capsule 3   insulin NPH Human (NOVOLIN N) 100 UNIT/ML injection INJECT 70 UNITS UNDER THE SKIN IN THE MORNING. 60 mL 1   insulin regular (NOVOLIN R) 100 units/mL injection Inject 0.1 mLs (10 Units total) into the skin 3 (three) times daily before meals. 10 mL 1   levothyroxine (SYNTHROID, LEVOTHROID) 150 MCG tablet Take 175 mcg by mouth daily before breakfast.     metFORMIN (GLUCOPHAGE) 500 MG tablet Take 1,000 mg by mouth 2 (two) times daily with a meal.     metoprolol tartrate (LOPRESSOR) 25 MG tablet TAKE 1 TABLET TWICE DAILY 180 tablet 3   Multiple Vitamin (MULTIVITAMIN) tablet Take 1 tablet by mouth daily.     pantoprazole (PROTONIX) 40 MG tablet Take 40 mg by mouth  daily.     polyethylene glycol (MIRALAX / GLYCOLAX) 17 g packet Take 17 g by mouth daily as needed for mild constipation. 14 each 0   rosuvastatin (CRESTOR) 20 MG tablet Take 20 mg by mouth at bedtime.     spironolactone (ALDACTONE) 25 MG tablet Take 25 mg by mouth daily.     tamsulosin (FLOMAX) 0.4 MG CAPS capsule Take 0.8 mg by mouth daily.     telmisartan (MICARDIS) 80 MG tablet Take 80 mg by mouth daily.     torsemide (DEMADEX) 20 MG tablet Take 1 tablet (20 mg total) by mouth daily. 90 tablet 3   No current facility-administered medications for this visit.   Allergies:  Actos [pioglitazone hydrochloride], Glyburide, Metformin and related, and Codeine   Social History: The patient  reports that he quit smoking about 12 years ago. His smoking use included cigarettes. He started smoking about 62 years ago. He has never used smokeless tobacco. He reports current alcohol use. He reports that he does not use drugs.   Family History: The patient's family history includes COPD in an other family member; Colon cancer in his brother; Diabetes in his brother, mother, and sister; Heart attack in an other family member; Hyperlipidemia in an other family member; Hypertension in an other family member.   ROS:  Please see the history of present illness. Otherwise, complete review of systems is positive for none.  All other systems are reviewed and negative.   Physical Exam: VS:  BP 136/70   Pulse 79   Ht 5\' 10"  (1.778 m)   Wt 236 lb (107 kg)   SpO2 94%   BMI 33.86 kg/m , BMI Body mass index is 33.86 kg/m.  Wt Readings from Last 3 Encounters:  09/27/22 236 lb (107 kg)  06/06/22 237 lb (107.5 kg)  10/17/21 225 lb (102.1 kg)    General: Patient appears comfortable at rest. HEENT: Conjunctiva and lids normal, oropharynx clear with moist mucosa. Neck: Supple, no elevated JVP or carotid bruits, no thyromegaly. Lungs: Clear to auscultation, nonlabored breathing at rest. Cardiac: Regular rate and  rhythm, no S3 or significant systolic murmur, no pericardial rub. Abdomen: Soft, nontender, no hepatomegaly, bowel sounds present, no guarding or rebound. Extremities: No pitting edema, distal pulses 2+. Skin: Warm and dry. Musculoskeletal: No kyphosis. Neuropsychiatric: Alert and oriented x3, affect grossly appropriate.  Recent Labwork: 10/06/2021: ALT 28; AST 19; BUN 17; Potassium 5.0; Sodium 130 08/08/2022: Creatinine, Ser 1.30     Component Value Date/Time   CHOL 157 10/06/2021 0912  TRIG 157.0 (H) 10/06/2021 0912   HDL 56.50 10/06/2021 0912   CHOLHDL 3 10/06/2021 0912   VLDL 31.4 10/06/2021 0912   LDLCALC 69 10/06/2021 0912     Assessment and Plan:  DOE multifactorial: Ongoing DOE x 1 year worsening recently.  Denies orthopnea or PND, wears CPAP at night but sometimes has difficulty breathing at night.  No leg swelling. Echo from 7/24 showed normal LVEF, G1 DD and no valvular heart disease.  CT angio chest from 7/24 showed emphysema and chronic bronchitis changes in the lungs.  Obtain PFTs and start p.o. torsemide 20 mg once daily for 2 weeks.  Obtain BMP in 5 days.  He is instructed to call the clinic after 2 weeks to report improvement in his symptoms.  Hold amlodipine for 2 weeks.  Will defer stress testing at this time and reevaluate his symptoms in 6 weeks.  HTN, controlled: Hold amlodipine for 2 weeks while starting torsemide, continue telmisartan 80 mg once daily and spironolactone 25 mg once daily.    Medication Adjustments/Labs and Tests Ordered: Current medicines are reviewed at length with the patient today.  Concerns regarding medicines are outlined above.    Disposition:  Follow up 6 weeks  Signed, Darion Milewski Verne Spurr, MD, 09/27/2022 11:55 AM    [facility] [address] [place], [place] [address]

## 2022-10-04 ENCOUNTER — Other Ambulatory Visit (HOSPITAL_COMMUNITY)
Admission: RE | Admit: 2022-10-04 | Discharge: 2022-10-04 | Disposition: A | Discharge status: Home / Self Care | Payer: Medicare HMO | Source: Ambulatory Visit | Attending: Internal Medicine | Admitting: Internal Medicine

## 2022-10-04 DIAGNOSIS — R0602 Shortness of breath: Secondary | ICD-10-CM | POA: Diagnosis not present

## 2022-10-04 LAB — BASIC METABOLIC PANEL
Anion gap: 14 (ref 5–15)
BUN: 32 mg/dL — ABNORMAL HIGH (ref 8–23)
CO2: 33 mmol/L — ABNORMAL HIGH (ref 22–32)
Calcium: 8.9 mg/dL (ref 8.9–10.3)
Chloride: 87 mmol/L — ABNORMAL LOW (ref 98–111)
Creatinine, Ser: 1.65 mg/dL — ABNORMAL HIGH (ref 0.61–1.24)
GFR, Estimated: 43 mL/min — ABNORMAL LOW (ref >60)
Glucose, Bld: 252 mg/dL — ABNORMAL HIGH (ref 70–99)
Potassium: 3.1 mmol/L — ABNORMAL LOW (ref 3.5–5.1)
Sodium: 134 mmol/L — ABNORMAL LOW (ref 135–145)

## 2022-10-06 ENCOUNTER — Telehealth: Payer: Self-pay | Admitting: Internal Medicine

## 2022-10-06 DIAGNOSIS — Z79899 Other long term (current) drug therapy: Secondary | ICD-10-CM

## 2022-10-06 NOTE — Telephone Encounter (Signed)
Will forward to Dr. Jenene Slicker for any further advice to patient.  Did suggest he increase potassium consumption in food for a few days.

## 2022-10-06 NOTE — Telephone Encounter (Signed)
Spoke with patient.  Has been keeping glucose levels around 100 for the past several months, working with PCP.  Since starting torsemide his sugars are up over 150 and some close to 200.   Has not noticed any changes in DOE since starting torsemide and notes that he uses his spirometer multiple times a day without issue.    Metabolic panel drawn yesterday shows close to 30% increase in SCr to 1.65 and drop in K to 3.1.    Advised patient to stop torsemide and resume use of amlodipine for BP.  (BP for last 3 days all WNL)

## 2022-10-06 NOTE — Telephone Encounter (Signed)
Pt c/o medication issue:  1. Name of Medication:  torsemide (DEMADEX) 20 MG tablet  2. How are you currently taking this medication (dosage and times per day)?  Once daily   3. Are you having a reaction (difficulty breathing--STAT)?   4. What is your medication issue?   Patient states this medication has causing his sugar levels to rise--168, 179, 166, 153, 151,146.

## 2022-10-09 NOTE — Telephone Encounter (Signed)
Spoke to patient who verbalized understanding. Patient will have labs drawn on Thursday 10/12/22 at [facility]. Pt had no further questions or concerns at this time.

## 2022-10-12 ENCOUNTER — Other Ambulatory Visit (HOSPITAL_COMMUNITY)
Admission: RE | Admit: 2022-10-12 | Discharge: 2022-10-12 | Disposition: A | Discharge status: Home / Self Care | Payer: Medicare HMO | Source: Ambulatory Visit | Attending: Internal Medicine | Admitting: Internal Medicine

## 2022-10-12 DIAGNOSIS — Z79899 Other long term (current) drug therapy: Secondary | ICD-10-CM | POA: Insufficient documentation

## 2022-10-12 LAB — BASIC METABOLIC PANEL
Anion gap: 9 (ref 5–15)
BUN: 19 mg/dL (ref 8–23)
CO2: 26 mmol/L (ref 22–32)
Calcium: 9 mg/dL (ref 8.9–10.3)
Chloride: 101 mmol/L (ref 98–111)
Creatinine, Ser: 1.28 mg/dL — ABNORMAL HIGH (ref 0.61–1.24)
GFR, Estimated: 58 mL/min — ABNORMAL LOW (ref >60)
Glucose, Bld: 281 mg/dL — ABNORMAL HIGH (ref 70–99)
Potassium: 3.7 mmol/L (ref 3.5–5.1)
Sodium: 136 mmol/L (ref 135–145)

## 2022-10-18 ENCOUNTER — Telehealth: Payer: Self-pay | Admitting: Internal Medicine

## 2022-10-18 NOTE — Telephone Encounter (Signed)
Patient returned staff call regarding results.

## 2022-10-18 NOTE — Telephone Encounter (Signed)
-----   Message from Vishnu P Mallipeddi sent at 10/18/2022  8:43 AM EDT ----- Serum creatinine significantly improved from 1.6 to 1.2.  Continue to hold torsemide.

## 2022-10-18 NOTE — Telephone Encounter (Signed)
Pt.notified

## 2022-10-24 DIAGNOSIS — Z9989 Dependence on other enabling machines and devices: Secondary | ICD-10-CM | POA: Diagnosis not present

## 2022-10-24 DIAGNOSIS — E1142 Type 2 diabetes mellitus with diabetic polyneuropathy: Secondary | ICD-10-CM | POA: Diagnosis not present

## 2022-10-24 DIAGNOSIS — Z23 Encounter for immunization: Secondary | ICD-10-CM | POA: Diagnosis not present

## 2022-10-24 DIAGNOSIS — I6521 Occlusion and stenosis of right carotid artery: Secondary | ICD-10-CM | POA: Diagnosis not present

## 2022-10-24 DIAGNOSIS — I10 Essential (primary) hypertension: Secondary | ICD-10-CM | POA: Diagnosis not present

## 2022-10-24 DIAGNOSIS — E7800 Pure hypercholesterolemia, unspecified: Secondary | ICD-10-CM | POA: Diagnosis not present

## 2022-10-24 DIAGNOSIS — Z794 Long term (current) use of insulin: Secondary | ICD-10-CM | POA: Diagnosis not present

## 2022-11-10 ENCOUNTER — Ambulatory Visit: Payer: Medicare HMO | Attending: Internal Medicine | Admitting: Internal Medicine

## 2022-11-10 ENCOUNTER — Encounter: Payer: Self-pay | Admitting: Internal Medicine

## 2022-11-10 VITALS — BP 152/80 | HR 80 | Ht 68.0 in | Wt 237.0 lb

## 2022-11-10 DIAGNOSIS — R0602 Shortness of breath: Secondary | ICD-10-CM

## 2022-11-10 NOTE — Progress Notes (Signed)
Cardiology Office Note  Date: 11/10/2022   ID: Christopher Patel, DOB 03/18/1945, MRN [id number]  PCP:  Darrow Bussing, MD  Cardiologist:  Marjo Bicker, MD Electrophysiologist:  None   History of Present Illness: Christopher Patel is a 77 y.o. male known to have HTN, OSA on CPAP is here for follow-up visit.  Ongoing DOE x 1 year, worsening recently, no orthopnea or PND. No pitting edema in the legs.  He gets swelling in the legs sometimes which resolves with compression socks.  No angina, dizziness, presyncope, presyncope.  Compliant with medications, did not take blood pressure medication this morning.  Past Medical History:  Diagnosis Date   CAD (coronary artery disease)    Change in voice    Cough    Depression    Diabetes mellitus 04-25-11   oral meds   ED (erectile dysfunction)    Hepatic lesion    left lobe   Hyperlipidemia    Hypertension    dr Lillette Boxer   Hypothyroidism    Lung nodule    Nasal congestion    Osteoarthritis 04-25-11   spine area-some neck issues   Paroxysmal atrial fibrillation (HCC)    started on metoprolol and Xarelto 07/02/2015 for afib RVR   Pneumonia    PONV (postoperative nausea and vomiting)    Thyroid disease    thyroid lesion   Tubular adenoma    polyps- Dr.Edwards    Past Surgical History:  Procedure Laterality Date   CARDIAC CATHETERIZATION  04-25-11   many yrs ago-very slight blockage 1 vessel (60% RCA by Dr. Docia Chuck notes)   CHOLECYSTECTOMY N/A 08/07/2018   Procedure: LAPAROSCOPIC CHOLECYSTECTOMY;  Surgeon: Rodman Pickle, MD;  Location: [facility];  Service: General;  Laterality: N/A;   EYE SURGERY  11/2010   cataract x2   EYE SURGERY  12/2010   len implant x2    KNEE SURGERY  04-25-11   right/ left knee scope   THYROIDECTOMY  05/02/2011   Procedure: THYROIDECTOMY;  Surgeon: Velora Heckler, MD;  Location: [facility];  Service: General;  Laterality: N/A;  Total Thyroidectomy   TOTAL KNEE ARTHROPLASTY Right 04/25/2013    Procedure: TOTAL KNEE ARTHROPLASTY;  Surgeon: Nestor Lewandowsky, MD;  Location: [facility] OR;  Service: Orthopedics;  Laterality: Right;    Current Outpatient Medications  Medication Sig Dispense Refill   ACCU-CHEK GUIDE test strip      Accu-Chek Softclix Lancets lancets      acetaminophen (TYLENOL) 500 MG tablet Take 500 mg by mouth every 6 (six) hours as needed for moderate pain (BACK PAIN).     amLODipine (NORVASC) 10 MG tablet Take 10 mg by mouth daily.     aspirin EC 81 MG tablet Take 81 mg by mouth daily.     B-D INS SYR ULTRAFINE 1CC/30G 30G X 1/2" 1 ML MISC USE TO INJECT INSULIN ONCE DAILY 90 each 2   chlorthalidone (HYGROTON) 25 MG tablet Take 25 mg by mouth daily.     Continuous Blood Gluc Receiver (DEXCOM G7 RECEIVER) DEVI To display CGM data 1 each 0   Continuous Blood Gluc Sensor (DEXCOM G7 SENSOR) MISC 1 Device by Does not apply route as directed. Change sensor every 10 days 3 each 3   fexofenadine (ALLEGRA) 180 MG tablet Take 180 mg by mouth daily as needed for allergies.      fluticasone (VERAMYST) 27.5 MCG/SPRAY nasal spray Place 2 sprays into the nose daily as needed for allergies.  gabapentin (NEURONTIN) 300 MG capsule Take 1 capsule (300 mg total) by mouth 3 (three) times daily. 90 capsule 3   insulin NPH Human (NOVOLIN N) 100 UNIT/ML injection INJECT 70 UNITS UNDER THE SKIN IN THE MORNING. 60 mL 1   insulin regular (NOVOLIN R) 100 units/mL injection Inject 0.1 mLs (10 Units total) into the skin 3 (three) times daily before meals. 10 mL 1   JARDIANCE 10 MG TABS tablet Take 10 mg by mouth daily.     levothyroxine (SYNTHROID, LEVOTHROID) 150 MCG tablet Take 175 mcg by mouth daily before breakfast.     metFORMIN (GLUCOPHAGE) 500 MG tablet Take 1,000 mg by mouth 2 (two) times daily with a meal.     metoprolol tartrate (LOPRESSOR) 25 MG tablet TAKE 1 TABLET TWICE DAILY 180 tablet 3   Multiple Vitamin (MULTIVITAMIN) tablet Take 1 tablet by mouth daily.     pantoprazole (PROTONIX) 40 MG  tablet Take 40 mg by mouth daily.     polyethylene glycol (MIRALAX / GLYCOLAX) 17 g packet Take 17 g by mouth daily as needed for mild constipation. 14 each 0   rosuvastatin (CRESTOR) 20 MG tablet Take 20 mg by mouth at bedtime.     tamsulosin (FLOMAX) 0.4 MG CAPS capsule Take 0.8 mg by mouth daily.     telmisartan (MICARDIS) 80 MG tablet Take 80 mg by mouth daily.     No current facility-administered medications for this visit.   Allergies:  Actos [pioglitazone hydrochloride], Glyburide, Metformin and related, and Codeine   Social History: The patient  reports that he quit smoking about 12 years ago. His smoking use included cigarettes. He started smoking about 63 years ago. He has never used smokeless tobacco. He reports current alcohol use. He reports that he does not use drugs.   Family History: The patient's family history includes COPD in an other family member; Colon cancer in his brother; Diabetes in his brother, mother, and sister; Heart attack in an other family member; Hyperlipidemia in an other family member; Hypertension in an other family member.   ROS:  Please see the history of present illness. Otherwise, complete review of systems is positive for none.  All other systems are reviewed and negative.   Physical Exam: VS:  BP (!) 166/76   Pulse 80   Ht 5\' 8"  (1.727 m)   Wt 237 lb (107.5 kg)   SpO2 94%   BMI 36.04 kg/m , BMI Body mass index is 36.04 kg/m.  Wt Readings from Last 3 Encounters:  11/10/22 237 lb (107.5 kg)  09/27/22 236 lb (107 kg)  06/06/22 237 lb (107.5 kg)    General: Patient appears comfortable at rest. HEENT: Conjunctiva and lids normal, oropharynx clear with moist mucosa. Neck: Supple, no elevated JVP or carotid bruits, no thyromegaly. Lungs: Clear to auscultation, nonlabored breathing at rest. Cardiac: Regular rate and rhythm, no S3 or significant systolic murmur, no pericardial rub. Abdomen: Soft, nontender, no hepatomegaly, bowel sounds present,  no guarding or rebound. Extremities: No pitting edema, distal pulses 2+. Skin: Warm and dry. Musculoskeletal: No kyphosis. Neuropsychiatric: Alert and oriented x3, affect grossly appropriate.  Recent Labwork: 10/12/2022: BUN 19; Creatinine, Ser 1.28; Potassium 3.7; Sodium 136     Component Value Date/Time   CHOL 157 10/06/2021 0912   TRIG 157.0 (H) 10/06/2021 0912   HDL 56.50 10/06/2021 0912   CHOLHDL 3 10/06/2021 0912   VLDL 31.4 10/06/2021 0912   LDLCALC 69 10/06/2021 0912     Assessment  and Plan:  DOE multifactorial: Ongoing DOE x 1 year worsening recently, denies orthopnea or PND, wears CPAP at night but sometimes has difficulty breathing at night. No pitting edema. Echo from 07/2022 showed normal LVEF, G1 DD with elevated LVEDP, no valvular heart disease. He was started on p.o. torsemide with resultant AKI and elevated blood glucose levels after which the medication was discontinued. CT angio chest from 7/24 showed emphysema and chronic bronchitis changes, he is scheduled for PFTs on 11/14/2022. Missy Sabins for DOE.  HTN, poorly controlled: Did not take his blood pressure medications this morning.  Continue amlodipine 10 mg once daily, metoprolol tartrate 25 mg twice daily, telmisartan 80 mg once daily.  Follows with PCP.   I spent total duration of 30 minutes reviewing prior notes, imaging studies, labs, face-to-face discussion/counseling of his medical condition, management management, ordering tests/imaging studies, documenting the findings in the note.   Disposition:  Follow up 1 year  Signed, Valetta Mulroy Verne Spurr, MD, 11/10/2022 10:22 AM    [facility] at [facility] [address] [place], [place] [address]

## 2022-11-10 NOTE — Patient Instructions (Signed)
Medication Instructions:  Your physician recommends that you continue on your current medications as directed. Please refer to the Current Medication list given to you today.  *If you need a refill on your cardiac medications before your next appointment, please call your pharmacy*   Lab Work: None If you have labs (blood work) drawn today and your tests are completely normal, you will receive your results only by: MyChart Message (if you have MyChart) OR A paper copy in the mail If you have any lab test that is abnormal or we need to change your treatment, we will call you to review the results.   Testing/Procedures: Your physician has requested that you have a lexiscan myoview. For further information please visit [email or web address] Please follow instruction sheet, as given.    Follow-Up: At [facility], you and your health needs are our priority.  As part of our continuing mission to provide you with exceptional heart care, we have created designated Provider Care Teams.  These Care Teams include your primary Cardiologist (physician) and Advanced Practice Providers (APPs -  Physician Assistants and Nurse Practitioners) who all work together to provide you with the care you need, when you need it.  We recommend signing up for the patient portal called "MyChart".  Sign up information is provided on this After Visit Summary.  MyChart is used to connect with patients for Virtual Visits (Telemedicine).  Patients are able to view lab/test results, encounter notes, upcoming appointments, etc.  Non-urgent messages can be sent to your provider as well.   To learn more about what you can do with MyChart, go to [email or web address].    Your next appointment:   1 year(s)  Provider:   Luane School, MD    Other Instructions

## 2022-11-14 ENCOUNTER — Ambulatory Visit (HOSPITAL_COMMUNITY)
Admission: RE | Admit: 2022-11-14 | Discharge: 2022-11-14 | Disposition: A | Discharge status: Home / Self Care | Payer: Medicare HMO | Source: Ambulatory Visit | Attending: Internal Medicine | Admitting: Internal Medicine

## 2022-11-14 DIAGNOSIS — R0602 Shortness of breath: Secondary | ICD-10-CM | POA: Insufficient documentation

## 2022-11-14 LAB — PULMONARY FUNCTION TEST
DL/VA % pred: 81 %
DL/VA: 3.2 ml/min/mmHg/L
DLCO unc % pred: 76 %
DLCO unc: 17.82 ml/min/mmHg
FEF 25-75 Post: 1.01 L/s
FEF 25-75 Pre: 1.33 L/s
FEF2575-%Change-Post: -23 %
FEF2575-%Pred-Post: 49 %
FEF2575-%Pred-Pre: 64 %
FEV1-%Change-Post: -4 %
FEV1-%Pred-Post: 83 %
FEV1-%Pred-Pre: 86 %
FEV1-Post: 2.34 L
FEV1-Pre: 2.44 L
FEV1FVC-%Change-Post: 0 %
FEV1FVC-%Pred-Pre: 90 %
FEV6-%Change-Post: -3 %
FEV6-%Pred-Post: 93 %
FEV6-%Pred-Pre: 96 %
FEV6-Post: 3.41 L
FEV6-Pre: 3.53 L
FEV6FVC-%Change-Post: 0 %
FEV6FVC-%Pred-Post: 102 %
FEV6FVC-%Pred-Pre: 101 %
FVC-%Change-Post: -3 %
FVC-%Pred-Post: 91 %
FVC-%Pred-Pre: 95 %
FVC-Post: 3.57 L
FVC-Pre: 3.71 L
Post FEV1/FVC ratio: 65 %
Post FEV6/FVC ratio: 95 %
Pre FEV1/FVC ratio: 66 %
Pre FEV6/FVC Ratio: 95 %
RV % pred: 97 %
RV: 2.39 L
TLC % pred: 93 %
TLC: 6.23 L

## 2022-11-14 MED ORDER — ALBUTEROL SULFATE (2.5 MG/3ML) 0.083% IN NEBU
2.5000 mg | INHALATION_SOLUTION | Freq: Once | RESPIRATORY_TRACT | Status: AC
  Administered 2022-11-14: 2.5 mg via RESPIRATORY_TRACT

## 2022-11-20 ENCOUNTER — Encounter (HOSPITAL_COMMUNITY): Payer: Medicare HMO

## 2022-11-20 ENCOUNTER — Ambulatory Visit (HOSPITAL_COMMUNITY)
Admission: RE | Admit: 2022-11-20 | Discharge: 2022-11-20 | Disposition: A | Discharge status: Home / Self Care | Payer: Medicare HMO | Source: Ambulatory Visit | Attending: Internal Medicine | Admitting: Internal Medicine

## 2022-11-20 DIAGNOSIS — R0602 Shortness of breath: Secondary | ICD-10-CM | POA: Diagnosis not present

## 2022-11-20 LAB — NM MYOCAR MULTI W/SPECT W/WALL MOTION / EF
Base ST Depression (mm): 0 mm
LV dias vol: 77 mL (ref 62–150)
LV sys vol: 19 mL
Nuc Stress EF: 75 %
Peak HR: 95 {beats}/min
RATE: 0.2
Rest HR: 69 {beats}/min
Rest Nuclear Isotope Dose: 8 mCi
SDS: 2
SRS: 1
SSS: 3
ST Depression (mm): 0 mm
Stress Nuclear Isotope Dose: 25.4 mCi
TID: 1.02

## 2022-11-20 MED ORDER — TECHNETIUM TC 99M TETROFOSMIN IV KIT
8.0000 | PACK | Freq: Once | INTRAVENOUS | Status: AC | PRN
  Administered 2022-11-20: 8 via INTRAVENOUS

## 2022-11-20 MED ORDER — TECHNETIUM TC 99M TETROFOSMIN IV KIT
25.4000 | PACK | Freq: Once | INTRAVENOUS | Status: AC | PRN
  Administered 2022-11-20: 25.4 via INTRAVENOUS

## 2022-11-20 MED ORDER — REGADENOSON 0.4 MG/5ML IV SOLN
INTRAVENOUS | Status: AC
  Administered 2022-11-20: 0.4 mg via INTRAVENOUS
  Filled 2022-11-20: qty 5

## 2022-11-20 MED ORDER — SODIUM CHLORIDE FLUSH 0.9 % IV SOLN
INTRAVENOUS | Status: AC
  Administered 2022-11-20: 10 mL via INTRAVENOUS
  Filled 2022-11-20: qty 10

## 2022-11-24 NOTE — Telephone Encounter (Signed)
Patient would like a call back to discuss results.

## 2022-12-07 DIAGNOSIS — I2510 Atherosclerotic heart disease of native coronary artery without angina pectoris: Secondary | ICD-10-CM | POA: Diagnosis not present

## 2022-12-07 DIAGNOSIS — E1169 Type 2 diabetes mellitus with other specified complication: Secondary | ICD-10-CM | POA: Diagnosis not present

## 2022-12-07 DIAGNOSIS — R2689 Other abnormalities of gait and mobility: Secondary | ICD-10-CM | POA: Diagnosis not present

## 2022-12-07 DIAGNOSIS — B078 Other viral warts: Secondary | ICD-10-CM | POA: Diagnosis not present

## 2022-12-07 DIAGNOSIS — I10 Essential (primary) hypertension: Secondary | ICD-10-CM | POA: Diagnosis not present

## 2022-12-07 DIAGNOSIS — L738 Other specified follicular disorders: Secondary | ICD-10-CM | POA: Diagnosis not present

## 2022-12-07 DIAGNOSIS — E039 Hypothyroidism, unspecified: Secondary | ICD-10-CM | POA: Diagnosis not present

## 2022-12-07 DIAGNOSIS — J439 Emphysema, unspecified: Secondary | ICD-10-CM | POA: Diagnosis not present

## 2022-12-07 DIAGNOSIS — E7800 Pure hypercholesterolemia, unspecified: Secondary | ICD-10-CM | POA: Diagnosis not present

## 2022-12-07 DIAGNOSIS — E1142 Type 2 diabetes mellitus with diabetic polyneuropathy: Secondary | ICD-10-CM | POA: Diagnosis not present

## 2022-12-07 DIAGNOSIS — L820 Inflamed seborrheic keratosis: Secondary | ICD-10-CM | POA: Diagnosis not present

## 2022-12-07 DIAGNOSIS — Z79899 Other long term (current) drug therapy: Secondary | ICD-10-CM | POA: Diagnosis not present

## 2022-12-07 DIAGNOSIS — F5101 Primary insomnia: Secondary | ICD-10-CM | POA: Diagnosis not present

## 2023-01-29 DIAGNOSIS — H35373 Puckering of macula, bilateral: Secondary | ICD-10-CM | POA: Diagnosis not present

## 2023-06-07 DIAGNOSIS — Z79899 Other long term (current) drug therapy: Secondary | ICD-10-CM | POA: Diagnosis not present

## 2023-06-07 DIAGNOSIS — I10 Essential (primary) hypertension: Secondary | ICD-10-CM | POA: Diagnosis not present

## 2023-06-07 DIAGNOSIS — I2510 Atherosclerotic heart disease of native coronary artery without angina pectoris: Secondary | ICD-10-CM | POA: Diagnosis not present

## 2023-06-07 DIAGNOSIS — E1142 Type 2 diabetes mellitus with diabetic polyneuropathy: Secondary | ICD-10-CM | POA: Diagnosis not present

## 2023-06-07 DIAGNOSIS — E7800 Pure hypercholesterolemia, unspecified: Secondary | ICD-10-CM | POA: Diagnosis not present

## 2023-06-07 DIAGNOSIS — J439 Emphysema, unspecified: Secondary | ICD-10-CM | POA: Diagnosis not present

## 2023-06-07 DIAGNOSIS — Z0000 Encounter for general adult medical examination without abnormal findings: Secondary | ICD-10-CM | POA: Diagnosis not present

## 2023-06-07 DIAGNOSIS — F5101 Primary insomnia: Secondary | ICD-10-CM | POA: Diagnosis not present

## 2023-06-07 DIAGNOSIS — E039 Hypothyroidism, unspecified: Secondary | ICD-10-CM | POA: Diagnosis not present

## 2023-07-09 DIAGNOSIS — I2510 Atherosclerotic heart disease of native coronary artery without angina pectoris: Secondary | ICD-10-CM | POA: Diagnosis not present

## 2023-07-09 DIAGNOSIS — I10 Essential (primary) hypertension: Secondary | ICD-10-CM | POA: Diagnosis not present

## 2023-07-09 DIAGNOSIS — E1142 Type 2 diabetes mellitus with diabetic polyneuropathy: Secondary | ICD-10-CM | POA: Diagnosis not present

## 2023-07-09 DIAGNOSIS — E1169 Type 2 diabetes mellitus with other specified complication: Secondary | ICD-10-CM | POA: Diagnosis not present

## 2023-08-07 DIAGNOSIS — I2510 Atherosclerotic heart disease of native coronary artery without angina pectoris: Secondary | ICD-10-CM | POA: Diagnosis not present

## 2023-08-07 DIAGNOSIS — I10 Essential (primary) hypertension: Secondary | ICD-10-CM | POA: Diagnosis not present

## 2023-08-07 DIAGNOSIS — E1169 Type 2 diabetes mellitus with other specified complication: Secondary | ICD-10-CM | POA: Diagnosis not present

## 2023-08-07 DIAGNOSIS — E1142 Type 2 diabetes mellitus with diabetic polyneuropathy: Secondary | ICD-10-CM | POA: Diagnosis not present

## 2023-08-23 DIAGNOSIS — E039 Hypothyroidism, unspecified: Secondary | ICD-10-CM | POA: Diagnosis not present

## 2023-08-23 DIAGNOSIS — E1142 Type 2 diabetes mellitus with diabetic polyneuropathy: Secondary | ICD-10-CM | POA: Diagnosis not present

## 2023-08-23 DIAGNOSIS — I10 Essential (primary) hypertension: Secondary | ICD-10-CM | POA: Diagnosis not present

## 2023-08-23 DIAGNOSIS — I2510 Atherosclerotic heart disease of native coronary artery without angina pectoris: Secondary | ICD-10-CM | POA: Diagnosis not present

## 2023-08-23 DIAGNOSIS — E1169 Type 2 diabetes mellitus with other specified complication: Secondary | ICD-10-CM | POA: Diagnosis not present

## 2023-09-06 DIAGNOSIS — I10 Essential (primary) hypertension: Secondary | ICD-10-CM | POA: Diagnosis not present

## 2023-09-06 DIAGNOSIS — E1169 Type 2 diabetes mellitus with other specified complication: Secondary | ICD-10-CM | POA: Diagnosis not present

## 2023-09-06 DIAGNOSIS — I2510 Atherosclerotic heart disease of native coronary artery without angina pectoris: Secondary | ICD-10-CM | POA: Diagnosis not present

## 2023-09-06 DIAGNOSIS — E1142 Type 2 diabetes mellitus with diabetic polyneuropathy: Secondary | ICD-10-CM | POA: Diagnosis not present

## 2023-09-07 DIAGNOSIS — E1142 Type 2 diabetes mellitus with diabetic polyneuropathy: Secondary | ICD-10-CM | POA: Diagnosis not present

## 2023-09-07 DIAGNOSIS — Z79899 Other long term (current) drug therapy: Secondary | ICD-10-CM | POA: Diagnosis not present

## 2023-09-07 DIAGNOSIS — I10 Essential (primary) hypertension: Secondary | ICD-10-CM | POA: Diagnosis not present

## 2023-09-07 DIAGNOSIS — E7800 Pure hypercholesterolemia, unspecified: Secondary | ICD-10-CM | POA: Diagnosis not present

## 2023-09-07 DIAGNOSIS — E039 Hypothyroidism, unspecified: Secondary | ICD-10-CM | POA: Diagnosis not present

## 2023-09-07 DIAGNOSIS — I2510 Atherosclerotic heart disease of native coronary artery without angina pectoris: Secondary | ICD-10-CM | POA: Diagnosis not present

## 2023-09-23 DIAGNOSIS — I10 Essential (primary) hypertension: Secondary | ICD-10-CM | POA: Diagnosis not present

## 2023-09-23 DIAGNOSIS — E1169 Type 2 diabetes mellitus with other specified complication: Secondary | ICD-10-CM | POA: Diagnosis not present

## 2023-09-23 DIAGNOSIS — I2510 Atherosclerotic heart disease of native coronary artery without angina pectoris: Secondary | ICD-10-CM | POA: Diagnosis not present

## 2023-09-23 DIAGNOSIS — E039 Hypothyroidism, unspecified: Secondary | ICD-10-CM | POA: Diagnosis not present

## 2023-09-23 DIAGNOSIS — E1142 Type 2 diabetes mellitus with diabetic polyneuropathy: Secondary | ICD-10-CM | POA: Diagnosis not present

## 2023-10-06 DIAGNOSIS — E1142 Type 2 diabetes mellitus with diabetic polyneuropathy: Secondary | ICD-10-CM | POA: Diagnosis not present

## 2023-10-06 DIAGNOSIS — E1169 Type 2 diabetes mellitus with other specified complication: Secondary | ICD-10-CM | POA: Diagnosis not present

## 2023-10-06 DIAGNOSIS — I10 Essential (primary) hypertension: Secondary | ICD-10-CM | POA: Diagnosis not present

## 2023-10-06 DIAGNOSIS — I2510 Atherosclerotic heart disease of native coronary artery without angina pectoris: Secondary | ICD-10-CM | POA: Diagnosis not present

## 2023-10-15 ENCOUNTER — Ambulatory Visit: Admitting: Diagnostic Neuroimaging

## 2023-10-15 ENCOUNTER — Encounter: Payer: Self-pay | Admitting: Diagnostic Neuroimaging

## 2023-10-15 VITALS — BP 132/66 | HR 66 | Ht 70.0 in | Wt 237.0 lb

## 2023-10-15 DIAGNOSIS — Z7985 Long-term (current) use of injectable non-insulin antidiabetic drugs: Secondary | ICD-10-CM | POA: Diagnosis not present

## 2023-10-15 DIAGNOSIS — R200 Anesthesia of skin: Secondary | ICD-10-CM | POA: Diagnosis not present

## 2023-10-15 DIAGNOSIS — E1142 Type 2 diabetes mellitus with diabetic polyneuropathy: Secondary | ICD-10-CM

## 2023-10-15 DIAGNOSIS — M48062 Spinal stenosis, lumbar region with neurogenic claudication: Secondary | ICD-10-CM | POA: Diagnosis not present

## 2023-10-15 NOTE — Patient Instructions (Addendum)
  LEFT > RIGHT HAND NUMBNESS  - check EMG/NCS for left CTS eval only (has known underlying diabetic polyneuropathy, and prior EMG/NCS of the BLE)  LOW BACK PAIN / lumbar spinal stenosis - follow up with Dr. Cesario and Dr. Beuford  Diabetic neuropathy --> numbness and tingling in hands / feet - continue diabetes treatment - continue tylenol  + gabapentin  300mg  three times a day

## 2023-10-15 NOTE — Progress Notes (Signed)
 [facility]  PATIENT: Christopher Patel DOB: 1945/08/14  REFERRING CLINICIAN: Koirala, Dibas, MD HISTORY FROM: patient  REASON FOR VISIT: new consult   HISTORICAL  CHIEF COMPLAINT:  Chief Complaint  Patient presents with   Numbness    Rm 7 with spouse Pt is well, reports he has been having lower back pain for at least 5 yrs. He is having occasional numbness in both hands.  He is also having burning and tingling in both feet.     HISTORY OF PRESENT ILLNESS:   78 year old male here for evaluation of numbness and tingling in hands and feet and low back pain issues.  Patient has had diabetes for over 10 years.  He has had numbness and tingling in the hands and feet progressively worsening over the past 5 years.  Has had some chronic low back pain issues managed conservatively by Dr. Beuford and Dr. Cesario.  He has tried epidural steroid injections and nerve root ablations in the lumbar spine region without relief.  Also having left greater than right hand numbness, mainly in digits 1 and 2 on the left side.  He is wearing some copper fit gloves to help with pain control.  Symptoms sometimes wake him up from night.  Patient referred here for carpal tunnel evaluation of the left hand.   REVIEW OF SYSTEMS: Full 14 system review of systems performed and negative with exception of: As per HPI.  ALLERGIES: Allergies  Allergen Reactions   Actos [Pioglitazone Hydrochloride] Nausea Only   Glyburide Other (See Comments)    hypoglycemia   Metformin  And Related Other (See Comments)    Hypoglycemia, shakes Fast acting metformin    Codeine Itching    HOME MEDICATIONS: Outpatient Medications Prior to Visit  Medication Sig Dispense Refill   ACCU-CHEK GUIDE test strip      Accu-Chek Softclix Lancets lancets      acetaminophen  (TYLENOL ) 500 MG tablet Take 500 mg by mouth every 6 (six) hours as needed for moderate pain (BACK PAIN).     amLODipine  (NORVASC ) 10 MG tablet  Take 10 mg by mouth daily.     aspirin  EC 81 MG tablet Take 81 mg by mouth daily.     B-D INS SYR ULTRAFINE 1CC/30G 30G X 1/2 1 ML MISC USE TO INJECT INSULIN  ONCE DAILY 90 each 2   chlorthalidone (HYGROTON) 25 MG tablet Take 25 mg by mouth daily.     Continuous Blood Gluc Receiver (DEXCOM G7 RECEIVER) DEVI To display CGM data 1 each 0   Continuous Blood Gluc Sensor (DEXCOM G7 SENSOR) MISC 1 Device by Does not apply route as directed. Change sensor every 10 days 3 each 3   fluticasone  (VERAMYST) 27.5 MCG/SPRAY nasal spray Place 2 sprays into the nose daily as needed for allergies.      gabapentin  (NEURONTIN ) 300 MG capsule Take 300 mg by mouth 3 (three) times daily.     insulin  NPH Human (NOVOLIN N) 100 UNIT/ML injection INJECT 70 UNITS UNDER THE SKIN IN THE MORNING. 60 mL 1   insulin  regular (NOVOLIN R) 100 units/mL injection Inject 0.1 mLs (10 Units total) into the skin 3 (three) times daily before meals. 10 mL 1   JARDIANCE 10 MG TABS tablet Take 10 mg by mouth daily.     metFORMIN  (GLUCOPHAGE ) 500 MG tablet Take 1,000 mg by mouth 2 (two) times daily with a meal.     metoprolol  tartrate (LOPRESSOR ) 25 MG tablet TAKE 1 TABLET TWICE DAILY 180 tablet  3   Multiple Vitamin (MULTIVITAMIN) tablet Take 1 tablet by mouth daily.     pantoprazole  (PROTONIX ) 40 MG tablet Take 40 mg by mouth daily.     rosuvastatin (CRESTOR) 20 MG tablet Take 20 mg by mouth at bedtime.     tamsulosin (FLOMAX) 0.4 MG CAPS capsule Take 0.8 mg by mouth daily.     telmisartan (MICARDIS) 80 MG tablet Take 80 mg by mouth daily.     fexofenadine (ALLEGRA) 180 MG tablet Take 180 mg by mouth daily as needed for allergies.  (Patient not taking: Reported on 10/15/2023)     gabapentin  (NEURONTIN ) 300 MG capsule Take 1 capsule (300 mg total) by mouth 3 (three) times daily. (Patient not taking: Reported on 10/15/2023) 90 capsule 3   levothyroxine  (SYNTHROID , LEVOTHROID) 150 MCG tablet Take 175 mcg by mouth daily before breakfast. (Patient  not taking: Reported on 10/15/2023)     polyethylene glycol (MIRALAX  / GLYCOLAX ) 17 g packet Take 17 g by mouth daily as needed for mild constipation. (Patient not taking: Reported on 10/15/2023) 14 each 0   No facility-administered medications prior to visit.    PAST MEDICAL HISTORY: Past Medical History:  Diagnosis Date   CAD (coronary artery disease)    Change in voice    Cough    Depression    Diabetes mellitus 04-25-11   oral meds   ED (erectile dysfunction)    Hepatic lesion    left lobe   Hyperlipidemia    Hypertension    dr regino ee   Hypothyroidism    Lung nodule    Nasal congestion    Osteoarthritis 04-25-11   spine area-some neck issues   Paroxysmal atrial fibrillation (HCC)    started on metoprolol  and Xarelto  07/02/2015 for afib RVR   Pneumonia    PONV (postoperative nausea and vomiting)    Thyroid  disease    thyroid  lesion   Tubular adenoma    polyps- Dr.Edwards    PAST SURGICAL HISTORY: Past Surgical History:  Procedure Laterality Date   CARDIAC CATHETERIZATION  04-25-11   many yrs ago-very slight blockage 1 vessel (60% RCA by Dr. regino notes)   CHOLECYSTECTOMY N/A 08/07/2018   Procedure: LAPAROSCOPIC CHOLECYSTECTOMY;  Surgeon: Stevie Herlene Righter, MD;  Location: [facility] OR;  Service: General;  Laterality: N/A;   EYE SURGERY  11/2010   cataract x2   EYE SURGERY  12/2010   len implant x2    KNEE SURGERY  04-25-11   right/ left knee scope   THYROIDECTOMY  05/02/2011   Procedure: THYROIDECTOMY;  Surgeon: Krystal CHRISTELLA Spinner, MD;  Location: [facility];  Service: General;  Laterality: N/A;  Total Thyroidectomy   TOTAL KNEE ARTHROPLASTY Right 04/25/2013   Procedure: TOTAL KNEE ARTHROPLASTY;  Surgeon: Dempsey JINNY Sensor, MD;  Location: [facility] OR;  Service: Orthopedics;  Laterality: Right;    FAMILY HISTORY: Family History  Problem Relation Age of Onset   Diabetes Mother    Diabetes Brother    Colon cancer Brother    COPD Other    Hypertension Other    Hyperlipidemia Other     Heart attack Other    Diabetes Sister     SOCIAL HISTORY: Social History   Socioeconomic History   Marital status: Widowed    Spouse name: Not on file   Number of children: Not on file   Years of education: Not on file   Highest education level: Not on file  Occupational History   Not on  file  Tobacco Use   Smoking status: Former    Current packs/day: 0.00    Types: Cigarettes    Start date: 11/25/1959    Quit date: 11/24/2009    Years since quitting: 13.8   Smokeless tobacco: Never  Vaping Use   Vaping status: Never Used  Substance and Sexual Activity   Alcohol use: Yes    Comment: Beer on weekends, 6 or more   Drug use: No   Sexual activity: Never  Other Topics Concern   Not on file  Social History Narrative   Not on file   Social Drivers of Health   Financial Resource Strain: Not on file  Food Insecurity: Not on file  Transportation Needs: Not on file  Physical Activity: Not on file  Stress: Not on file  Social Connections: Not on file  Intimate Partner Violence: Not on file     PHYSICAL EXAM  GENERAL EXAM/CONSTITUTIONAL: Vitals:  Vitals:   10/15/23 1132  BP: 132/66  Pulse: 66  Weight: 237 lb (107.5 kg)  Height: 5' 10 (1.778 m)   Body mass index is 34.01 kg/m. Wt Readings from Last 3 Encounters:  10/15/23 237 lb (107.5 kg)  11/10/22 237 lb (107.5 kg)  09/27/22 236 lb (107 kg)   Patient is in no distress; well developed, nourished and groomed; neck is supple  CARDIOVASCULAR: Examination of carotid arteries is normal; no carotid bruits Regular rate and rhythm, no murmurs Examination of peripheral vascular system by observation and palpation is normal  EYES: Ophthalmoscopic exam of optic discs and posterior segments is normal; no papilledema or hemorrhages No results found.  MUSCULOSKELETAL: Gait, strength, tone, movements noted in Neurologic exam below  NEUROLOGIC: MENTAL STATUS:      No data to display         awake, alert,  oriented to person, place and time recent and remote memory intact normal attention and concentration language fluent, comprehension intact, naming intact fund of knowledge appropriate  CRANIAL NERVE:  2nd - no papilledema on fundoscopic exam 2nd, 3rd, 4th, 6th - pupils equal and reactive to light, visual fields full to confrontation, extraocular muscles intact, no nystagmus 5th - facial sensation symmetric 7th - facial strength symmetric 8th - hearing intact 9th - palate elevates symmetrically, uvula midline 11th - shoulder shrug symmetric 12th - tongue protrusion midline  MOTOR:  normal bulk and tone, full strength in the BUE, BLE  SENSORY:  normal and symmetric to light touch, temperature, vibration  COORDINATION:  finger-nose-finger, fine finger movements normal  REFLEXES:  deep tendon reflexes present and symmetric  GAIT/STATION:  narrow based gait; ANTALGIC GAIT     DIAGNOSTIC DATA (LABS, IMAGING, TESTING) - I reviewed patient records, labs, notes, testing and imaging myself where available.  Lab Results  Component Value Date   WBC 14.2 (H) 08/08/2018   HGB 12.5 (L) 08/08/2018   HCT 38.1 (L) 08/08/2018   MCV 91.8 08/08/2018   PLT 166 08/08/2018      Component Value Date/Time   NA 136 10/12/2022 1342   K 3.7 10/12/2022 1342   CL 101 10/12/2022 1342   CO2 26 10/12/2022 1342   GLUCOSE 281 (H) 10/12/2022 1342   BUN 19 10/12/2022 1342   CREATININE 1.28 (H) 10/12/2022 1342   CALCIUM  9.0 10/12/2022 1342   PROT 6.9 10/06/2021 0912   ALBUMIN 4.0 10/06/2021 0912   AST 19 10/06/2021 0912   ALT 28 10/06/2021 0912   ALKPHOS 57 10/06/2021 0912   BILITOT 0.5  10/06/2021 0912   GFRNONAA 58 (L) 10/12/2022 1342   GFRAA >60 08/08/2018 0337   Lab Results  Component Value Date   CHOL 157 10/06/2021   HDL 56.50 10/06/2021   LDLCALC 69 10/06/2021   TRIG 157.0 (H) 10/06/2021   CHOLHDL 3 10/06/2021   Lab Results  Component Value Date   HGBA1C 7.7 (H) 10/06/2021    No results found for: [id number] Lab Results  Component Value Date   TSH 0.439 07/02/2015      ASSESSMENT AND PLAN  78 y.o. year old male here with:   Dx:  1. Bilateral hand numbness   2. Spinal stenosis of lumbar region with neurogenic claudication   3. Diabetic polyneuropathy associated with type 2 diabetes mellitus (HCC)       PLAN:  LEFT > RIGHT HAND NUMBNESS  - check EMG/NCS for left CTS eval only (has known underlying diabetic polyneuropathy, and prior EMG/NCS of the BLE)  LOW BACK PAIN / lumbar spinal stenosis - follow up with Dr. Cesario and Dr. Beuford  Diabetic neuropathy --> numbness and tingling in hands / feet - continue diabetes treatment - continue tylenol  + gabapentin  300mg  three times a day   Orders Placed This Encounter  Procedures   NCV with EMG(electromyography)   Return for for NCV/EMG with Dr. Onita.    EDUARD FABIENE HANLON, MD 10/15/2023, 12:27 PM Certified in Neurology, Neurophysiology and Neuroimaging  [facility] [address] [place], [place] [address] [phone]

## 2023-10-23 DIAGNOSIS — E1169 Type 2 diabetes mellitus with other specified complication: Secondary | ICD-10-CM | POA: Diagnosis not present

## 2023-10-23 DIAGNOSIS — E039 Hypothyroidism, unspecified: Secondary | ICD-10-CM | POA: Diagnosis not present

## 2023-10-23 DIAGNOSIS — I2510 Atherosclerotic heart disease of native coronary artery without angina pectoris: Secondary | ICD-10-CM | POA: Diagnosis not present

## 2023-10-23 DIAGNOSIS — E1142 Type 2 diabetes mellitus with diabetic polyneuropathy: Secondary | ICD-10-CM | POA: Diagnosis not present

## 2023-11-05 ENCOUNTER — Other Ambulatory Visit: Payer: Self-pay | Admitting: Internal Medicine

## 2023-11-05 ENCOUNTER — Telehealth: Payer: Self-pay

## 2023-11-05 DIAGNOSIS — E1169 Type 2 diabetes mellitus with other specified complication: Secondary | ICD-10-CM | POA: Diagnosis not present

## 2023-11-05 DIAGNOSIS — R0602 Shortness of breath: Secondary | ICD-10-CM | POA: Diagnosis not present

## 2023-11-05 DIAGNOSIS — I10 Essential (primary) hypertension: Secondary | ICD-10-CM | POA: Diagnosis not present

## 2023-11-05 DIAGNOSIS — Z86018 Personal history of other benign neoplasm: Secondary | ICD-10-CM | POA: Diagnosis not present

## 2023-11-05 DIAGNOSIS — I2510 Atherosclerotic heart disease of native coronary artery without angina pectoris: Secondary | ICD-10-CM | POA: Diagnosis not present

## 2023-11-05 DIAGNOSIS — K219 Gastro-esophageal reflux disease without esophagitis: Secondary | ICD-10-CM | POA: Diagnosis not present

## 2023-11-05 DIAGNOSIS — E1142 Type 2 diabetes mellitus with diabetic polyneuropathy: Secondary | ICD-10-CM | POA: Diagnosis not present

## 2023-11-05 NOTE — Telephone Encounter (Signed)
   Name: Pleas Ronald Rahimi  DOB: [birth year]  MRN: [id number]  Primary Cardiologist: Vishnu P Mallipeddi, MD  Chart reviewed as part of pre-operative protocol coverage. Because of Tyaire Ronald Mckowen's past medical history and time since last visit, he will require a follow-up in-office visit in order to better assess preoperative cardiovascular risk.  Pre-op covering staff: - Please schedule appointment and call patient to inform them. If patient already had an upcoming appointment within acceptable timeframe, please add pre-op clearance to the appointment notes so provider is aware. - Please contact requesting surgeon's office via preferred method (i.e, phone, fax) to inform them of need for appointment prior to surgery.  No medications indicated as needing held.  Orren LOISE Fabry, PA-C  11/05/2023, 3:56 PM

## 2023-11-05 NOTE — Telephone Encounter (Signed)
 I s/w the pt and stated I am going to send a message to Dr. Mallipeddi scheduler to see where they can get the pt for yrly f/u appt as well as for preop clearance.   Pt did say if we could not get him in before his 12/10/23 procedure date, that he was ok with that and reschedule. I assured the pt that we try our best to not delay a procedure or surgery for the pt.

## 2023-11-05 NOTE — Telephone Encounter (Signed)
   Pre-operative Risk Assessment    Patient Name: Christopher Patel  DOB: 09-23-1945 MRN: [id number]   Date of last office visit: 11/10/22 Date of next office visit: Not scheduled   Request for Surgical Clearance    Procedure:  Colonoscopy  Date of Surgery:  Clearance 12/10/23                                Surgeon:  Dr. Kriss Stagger Surgeon's Group or Practice Name:  [facility] Phone number:  [phone] Fax number:  [phone]   Type of Clearance Requested:   - Medical    Type of Anesthesia:  Propofol    Additional requests/questions:    Bonney Ival LOISE Gerome   11/05/2023, 2:56 PM

## 2023-11-06 ENCOUNTER — Ambulatory Visit: Attending: Nurse Practitioner | Admitting: Nurse Practitioner

## 2023-11-06 ENCOUNTER — Encounter: Payer: Self-pay | Admitting: Nurse Practitioner

## 2023-11-06 VITALS — BP 138/70 | HR 67 | Ht 70.0 in | Wt 237.6 lb

## 2023-11-06 DIAGNOSIS — G4733 Obstructive sleep apnea (adult) (pediatric): Secondary | ICD-10-CM

## 2023-11-06 DIAGNOSIS — I10 Essential (primary) hypertension: Secondary | ICD-10-CM

## 2023-11-06 DIAGNOSIS — R0609 Other forms of dyspnea: Secondary | ICD-10-CM

## 2023-11-06 DIAGNOSIS — J449 Chronic obstructive pulmonary disease, unspecified: Secondary | ICD-10-CM | POA: Diagnosis not present

## 2023-11-06 DIAGNOSIS — R0902 Hypoxemia: Secondary | ICD-10-CM | POA: Diagnosis not present

## 2023-11-06 DIAGNOSIS — Z01810 Encounter for preprocedural cardiovascular examination: Secondary | ICD-10-CM

## 2023-11-06 DIAGNOSIS — I6523 Occlusion and stenosis of bilateral carotid arteries: Secondary | ICD-10-CM | POA: Diagnosis not present

## 2023-11-06 NOTE — Progress Notes (Unsigned)
 Cardiology Office Note   Date:  11/06/2023 ID:  Christopher Patel, DOB 07-21-1945, MRN [id number] PCP: Regino Slater, MD  [facility] Providers Cardiologist:  Diannah SHAUNNA Maywood, MD     History of Present Illness Christopher Patel is a 78 y.o. male with a PMH of DOE, HTN, OSA on CPAP, leg edema, COPD, who presents today for 1 year follow-up and pre-op clearance.   Last seen by Dr. Mallipeddi on 11/10/2022. At the time, noted ongoing DOE x 1 year, with worsening symptoms. Did note some leg edema. Lexiscan  was obtained for DOE - result was normal.   11/06/2023 -  Here for 1 year follow-up and preop clearance.  He is pending future colonoscopy. Overall doing well from a cardiac perspective.  Admits to neuropathy sensation, has spinal stenosis.  Wife helps with managing neuropathy at home.  Does note dependent swelling to lower extremities, wears compression stockings to help with this. Denies any chest pain, shortness of breath, palpitations, syncope, presyncope, dizziness, orthopnea, PND, swelling or significant weight changes, acute bleeding, or claudication.  SpO2 checked regularly at home with readings at 94% on room air. Does notice phlegm early in the morning when first getting up.  ROS: Negative.  See HPI. SH: Son is a PA with [employer] and works in [place], [place], and [place].   Studies Reviewed  EKG:  EKG Interpretation Date/Time:  Tuesday November 06 2023 14:07:04 EDT Ventricular Rate:  67 PR Interval:  154 QRS Duration:  94 QT Interval:  412 QTC Calculation: 435 R Axis:   -12  Text Interpretation: Normal sinus rhythm Low voltage QRS When compared with ECG of 27-Sep-2022 10:45, Questionable change in QRS axis Confirmed by Miriam Norris [phone]) on 11/06/2023 2:08:44 PM   Lexiscan  10/2022:    Stress ECG is negative for ischemia and arrhythmias.   LV perfusion is normal. There is no evidence of ischemia. There is no evidence of infarction.   Left  ventricular function is normal. Nuclear stress EF: 75%.   Findings are consistent with no ischemia and no infarction. The study is low risk.  Echo 07/2022:   1. Left ventricular ejection fraction, by estimation, is 60 to 65%. The  left ventricle has normal function. The left ventricle has no regional  wall motion abnormalities. There is mild left ventricular hypertrophy.  Left ventricular diastolic parameters  are consistent with Grade I diastolic dysfunction (impaired relaxation).  Elevated left ventricular end-diastolic pressure.   2. Right ventricular systolic function is normal. The right ventricular  size is normal. There is normal pulmonary artery systolic pressure.   3. Left atrial size was mildly dilated.   4. The mitral valve is normal in structure. No evidence of mitral valve  regurgitation. No evidence of mitral stenosis.   5. The aortic valve is tricuspid. Aortic valve regurgitation is not  visualized. No aortic stenosis is present.   6. The inferior vena cava is normal in size with greater than 50%  respiratory variability, suggesting right atrial pressure of 3 mmHg.  Carotid duplex 08/2020:  IMPRESSION: Right ICA stenosis estimated at 50-69%   Left ICA stenosis less than 50%   Patent antegrade vertebral flow bilaterally   Risk Assessment/Calculations     The 10-year ASCVD risk score (Arnett DK, et al., 2019) is: 50.5%   Values used to calculate the score:     Age: 79 years     Clincally relevant sex: Male     Is Non-Hispanic African American: No  Diabetic: Yes     Tobacco smoker: No     Systolic Blood Pressure: 138 mmHg     Is BP treated: Yes     HDL Cholesterol: 56.5 mg/dL     Total Cholesterol: 157 mg/dL  Physical Exam VS:  BP 138/70 (BP Location: Left Arm)   Pulse 67   Ht 5' 10 (1.778 m)   Wt 237 lb 9.6 oz (107.8 kg)   SpO2 90%   BMI 34.09 kg/m        Wt Readings from Last 3 Encounters:  11/06/23 237 lb 9.6 oz (107.8 kg)  10/15/23 237 lb (107.5  kg)  11/10/22 237 lb (107.5 kg)    GEN: Obese, 78 year old male in no acute distress NECK: No JVD; No carotid bruits CARDIAC: S1/S2, RRR, no murmurs, rubs, gallops RESPIRATORY:  Clear to auscultation without rales, wheezing or rhonchi  ABDOMEN: Soft, non-tender, non-distended EXTREMITIES:  No edema; No deformity   ASSESSMENT AND PLAN  Pre-operative cardiovascular risk assessment  Christopher Patel's perioperative risk of a major cardiac event is 0.9% according to the Revised Cardiac Risk Index (RCRI).  Therefore, he is at low risk for perioperative complications.   His functional capacity is fair at 4.64 METs according to the Duke Activity Status Index (DASI). Recommendations: According to ACC/AHA guidelines, no further cardiovascular testing needed.  The patient may proceed to surgery at acceptable risk.   Antiplatelet and/or Anticoagulation Recommendations: The patient should remain on Aspirin  without interruption.    2. HTN BP is stable and at goal. Discussed to monitor BP at home at least 2 hours after medications and sitting for 5-10 minutes.  No medication changes at this time. Heart healthy diet and regular cardiovascular exercise encouraged.  3.  Carotid artery stenosis Carotid duplex from August 2022 showed right ICA stenosis estimated at 50 to 69%, left ICA stenosis less than 50% with patent antegrade vertebral flow bilaterally.  Denies any symptoms. Not addressed, but at next office visit will address repeating study.   4. COPD, DOE, OSA on CPAP/hypoxia Went over and reviewed testing from last year.  CT scan from July 2024 was negative for PE, showed emphysema/chronic bronchitic changes.  SpO2 with vital signs today 91% on room air.  Not in acute distress on exam.  Recommended continued checking SpO2 at home.  Discussed when to notify provider and care and ED precautions discussed.  He verbalized understanding.   Dispo: Will request labs from his PCP.  Follow-up with MD/APP in 6 months  or sooner if anything changes.   Signed, Almarie Crate, NP

## 2023-11-06 NOTE — Telephone Encounter (Signed)
 Pt has appt 11/06/23 with Almarie Crate, NP for preop clearance.    I will update the requesting office appt moved up sooner as to not delay his procedure.

## 2023-11-06 NOTE — Patient Instructions (Addendum)

## 2023-11-06 NOTE — Telephone Encounter (Signed)
 Pt has appt 12/12/23 with Laymon Qua, [facility] and has been placed on a waitlist as well.   I will update all parties involved.   Procedure will need to be post poned for 12/10/23 until pt has been cleared.

## 2023-11-23 DIAGNOSIS — E1142 Type 2 diabetes mellitus with diabetic polyneuropathy: Secondary | ICD-10-CM | POA: Diagnosis not present

## 2023-11-23 DIAGNOSIS — I2510 Atherosclerotic heart disease of native coronary artery without angina pectoris: Secondary | ICD-10-CM | POA: Diagnosis not present

## 2023-11-23 DIAGNOSIS — E039 Hypothyroidism, unspecified: Secondary | ICD-10-CM | POA: Diagnosis not present

## 2023-11-23 DIAGNOSIS — I10 Essential (primary) hypertension: Secondary | ICD-10-CM | POA: Diagnosis not present

## 2023-11-23 DIAGNOSIS — E1169 Type 2 diabetes mellitus with other specified complication: Secondary | ICD-10-CM | POA: Diagnosis not present

## 2023-12-05 ENCOUNTER — Ambulatory Visit: Admitting: Neurology

## 2023-12-05 VITALS — BP 180/51 | Ht 70.0 in

## 2023-12-05 DIAGNOSIS — E1169 Type 2 diabetes mellitus with other specified complication: Secondary | ICD-10-CM | POA: Diagnosis not present

## 2023-12-05 DIAGNOSIS — R269 Unspecified abnormalities of gait and mobility: Secondary | ICD-10-CM | POA: Insufficient documentation

## 2023-12-05 DIAGNOSIS — M549 Dorsalgia, unspecified: Secondary | ICD-10-CM | POA: Diagnosis not present

## 2023-12-05 DIAGNOSIS — E1142 Type 2 diabetes mellitus with diabetic polyneuropathy: Secondary | ICD-10-CM | POA: Diagnosis not present

## 2023-12-05 DIAGNOSIS — I10 Essential (primary) hypertension: Secondary | ICD-10-CM | POA: Diagnosis not present

## 2023-12-05 DIAGNOSIS — R200 Anesthesia of skin: Secondary | ICD-10-CM

## 2023-12-05 DIAGNOSIS — G5603 Carpal tunnel syndrome, bilateral upper limbs: Secondary | ICD-10-CM | POA: Insufficient documentation

## 2023-12-05 DIAGNOSIS — G8929 Other chronic pain: Secondary | ICD-10-CM | POA: Diagnosis not present

## 2023-12-05 DIAGNOSIS — M542 Cervicalgia: Secondary | ICD-10-CM | POA: Diagnosis not present

## 2023-12-05 DIAGNOSIS — I2510 Atherosclerotic heart disease of native coronary artery without angina pectoris: Secondary | ICD-10-CM | POA: Diagnosis not present

## 2023-12-05 MED ORDER — ALPRAZOLAM 1 MG PO TABS: 1.0000 mg | ORAL_TABLET | Freq: Every evening | ORAL | 0 refills | Status: AC | PRN

## 2023-12-05 NOTE — Procedures (Signed)
 Full Name: Christopher Patel Gender: Male MRN #: [id number] Date of Birth: 01-26-1945    Visit Date: 12/05/2023 11:05 Age: 78 Years Examining Physician: Onita Duos Referring Physician: Margaret Height: 5 feet 10 inch History: 78 year old male, with slow Worsening low back pain, upper and lower extremity paresthesia, gait abnormality  Summary of the test: Nerve conduction study: Left sural, superficial peroneal sensory responses were absent. Left peroneal to EDB, tibial motor responses were absent.  Left median sensory response was absent.  Right median sensory response showed significantly prolonged peak latency with significantly decreased snap amplitude.  Bilateral radial sensory response showed moderately decreased snap amplitude.  Bilateral ulnar motor responses showed mildly decreased CMAP amplitude.  Left median motor response showed significantly decreased CMAP amplitude with distal latency.  Right median motor response showed mildly prolonged distal latency, within normal range CMAP amplitude  Electromyography: Selected needle examinations were performed at bilateral upper extremity muscles, cervical paraspinals; left lower extremity muscles and lumbosacral paraspinals.  There was evidence of chronic neuropathic changes involving bilateral C5, 6, 7 T1 myotomes.  Bilateral cervical paraspinal muscles showed polyphasic complex unit potential, with increased insertional activity.  There was also evidence of mild chronic neuropathic changes involving distal left lower extremity muscles,  Left lumbosacral paraspinal muscles showed increased insertional activity, complex polyphasic motor unit potential.  Conclusion: This is an  abnormal study.  There is electrodiagnostic evidence of moderately severe peripheral neuropathy with superimposed bilateral carpal tunnel syndromes.  There is also evidence of chronic bilateral cervical radiculopathy involving bilateral C5, 6, 7 T1  myotomes, also chronic left L4-5 S1 radiculopathy.    ------------------------------- Duos Onita. M.D. Ph.D.   [facility] [address], [address] [place], [place] [address] Tel: [phone] Fax: [phone]  Verbal informed consent was obtained from the patient, patient was informed of potential risk of procedure, including bruising, bleeding, hematoma formation, infection, muscle weakness, muscle pain, numbness, among others.        MNC    Nerve / Sites Muscle Latency Ref. Amplitude Ref. Rel Amp Segments Distance Velocity Ref. Area    ms ms mV mV %  cm m/s m/s mVms  R Median - APB     Wrist APB 5.3 <=4.4 4.4 >=4.0 100 Wrist - APB 7   13.7     Upper arm APB 9.9  3.9  89.4 Upper arm - Wrist 22 48 >=49 12.7  L Median - APB     Wrist APB 4.9 <=4.4 1.8 >=4.0 100 Wrist - APB 7   3.4     Upper arm APB 10.8  1.6  88.7 Upper arm - Wrist 23 38 >=49 6.3  R Ulnar - ADM     Wrist ADM 3.1 <=3.3 4.7 >=6.0 100 Wrist - ADM 7   15.1     B.Elbow ADM 6.5  4.1  88.5 B.Elbow - Wrist 17 50 >=49 14.2     A.Elbow ADM 8.9  4.6  112 A.Elbow - B.Elbow 13 53 >=49 15.7  L Ulnar - ADM     Wrist ADM 3.1 <=3.3 4.5 >=6.0 100 Wrist - ADM 7   9.6     B.Elbow ADM 5.2  4.1  91.4 B.Elbow - Wrist 8 39 >=49 7.0     A.Elbow ADM 8.9  3.9  93.8 A.Elbow - B.Elbow 16 43 >=49 9.1  L Peroneal - EDB     Ankle EDB NR <=6.5 NR >=2.0 NR Ankle - EDB 9  NR         Pop fossa - Ankle      L Tibial - AH     Ankle AH NR <=5.8 NR >=4.0 NR Ankle - AH 9   NR                 SNC    Nerve / Sites Rec. Site Peak Lat Ref.  Amp Ref. Segments Distance    ms ms V V  cm  L Radial - Anatomical snuff box (Forearm)     Forearm Wrist 2.5 <=2.9 7 >=15 Forearm - Wrist 10  R Radial - Anatomical snuff box (Forearm)     Forearm Wrist 2.6 <=2.9 7 >=15 Forearm - Wrist 10  L Sural - Ankle (Calf)     Calf Ankle NR <=4.4 NR >=6 Calf - Ankle 14  L Superficial peroneal - Ankle     Lat leg Ankle NR <=4.4 NR >=6 Lat leg - Ankle  14  L Median - Orthodromic (Dig II, Mid palm)     Dig II Wrist NR <=3.4 NR >=10 Dig II - Wrist 13  R Median - Orthodromic (Dig II, Mid palm)     Dig II Wrist 4.4 <=3.4 3 >=10 Dig II - Wrist 13  L Ulnar - Orthodromic, (Dig V, Mid palm)     Dig V Wrist 3.0 <=3.1 2 >=5 Dig V - Wrist 11  R Ulnar - Orthodromic, (Dig V, Mid palm)     Dig V Wrist 3.2 <=3.1 3 >=5 Dig V - Wrist 48                     F  Wave    Nerve F Lat Ref.   ms ms  R Ulnar - ADM 33.7 <=32.0  L Ulnar - ADM 34.6 <=32.0         EMG Summary Table    Spontaneous MUAP Recruitment  Muscle IA Fib PSW Fasc Other Amp Dur. Poly Pattern  L. Brachioradialis Normal None None None _______ Normal Normal Normal Reduced  L. Tibialis anterior Normal None None None _______ Normal Normal Normal Reduced  L. Tibialis posterior Normal None None None _______ Normal Normal Normal Reduced  L. Peroneus longus Normal None None None _______ Increased Increased 1+ Reduced  L. Gastrocnemius (Medial head) Normal None None None _______ Normal Normal Normal Reduced  L. Vastus lateralis Normal None None None _______ Increased Increased 1+ Reduced  L. Lumbar paraspinals (low) Normal None None None _______ Increased Increased 1+ Reduced  L. Lumbar paraspinals (mid) Normal None None None _______ Increased Increased 1+ Reduced  L. First dorsal interosseous Normal None None None _______ Normal Normal Normal Reduced  L. Pronator teres Normal None None None _______ Normal Normal Normal Reduced  L. Biceps brachii Normal None None None _______ Normal Normal Normal Reduced  L. Deltoid Normal None None None _______ Normal Normal Normal Reduced  L. Triceps brachii Normal None None None _______ Normal Normal Normal Reduced  L. Cervical paraspinals Normal None None None _______ Increased Increased 1+ Normal  L. Extensor digitorum communis Normal None None None _______ Normal Normal Normal Reduced  L. Abductor pollicis brevis Normal None None None _______ Normal Normal  Normal Reduced  L. Abductor digiti minimi (manus) Normal None None None _______ Normal Normal Normal Reduced  R. Abductor pollicis brevis Increased None None None _______ Increased Increased 1+ Reduced  R. First dorsal interosseous Increased None None None _______ Increased Increased 1+  Reduced  R. Abductor digiti minimi (manus) Increased None None None _______ Increased Increased 1+ Reduced  R. Pronator teres Normal None None None _______ Normal Normal Normal Reduced  R. Triceps brachii Normal None None None _______ Normal Normal Normal Reduced  R. Deltoid Normal None None None _______ Normal Normal Normal Reduced  R. Biceps brachii Normal None None None _______ Normal Normal Normal Reduced  R. Cervical paraspinals Normal None None None _______ Increased Increased 1+ Normal

## 2023-12-05 NOTE — Progress Notes (Signed)
 Chief Complaint  Patient presents with   NERVE CONDUCTION STUDY     Emg rm3, alone, NERVE CONDUCTION STUDY:pt is stable   ASSESSMENT AND PLAN  Christopher Patel is a 78 y.o. 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  In addition there is evidence of chronic bilateral C5-T1 cervical radiculopathy and chronic left L4-S1 lumbar radiculopathy.  Discussed with patient, agree for MRI of cervical lumbar spine for further evaluation  Referred to physical therapy   DIAGNOSTIC DATA (LABS, IMAGING, TESTING) - I reviewed patient records, labs, notes, testing and imaging myself where available.   MEDICAL HISTORY:  Taray Ronald Gin, is a 78 year old male, seen in request by Dr.   Margaret, Eduard for electrodiagnostic study evaluation of bilateral upper and lower extremity paresthesia, weakness, slow worsening gait abnormality, his primary care physician is Dr. Regino, Dibas, MD   History is obtained from the patient and review of electronic medical records. I personally reviewed pertinent available imaging films in PACS.   PMHx of  HTN HLD DM-insulin  dependent Obesity CAD Hx of of both knee replacement Lumbar spinal stenosis.  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  In addition there is evidence of chronic bilateral C5-T1 cervical radiculopathy and chronic left L4-S1 lumbar radiculopathy.  PHYSICAL EXAM:   Vitals:   12/05/23 1016 12/05/23 1021  BP: (!) 159/71 (!) 180/51  Height: 5' 10 (1.778 m)    Body mass index is 34.09 kg/m.  PHYSICAL EXAMNIATION:  Gen: NAD, conversant, well nourised, well groomed                     Cardiovascular: Regular rate rhythm, no peripheral edema, warm, nontender. Eyes: Conjunctivae clear without exudates or hemorrhage Neck: Supple, no carotid bruits. Pulmonary: Clear to auscultation bilaterally   NEUROLOGICAL EXAM:  MENTAL STATUS: Speech/cognition: Awake, alert, oriented to history taking and casual conversation CRANIAL NERVES: CN II: Visual fields are full to confrontation. Pupils are round equal and briskly reactive to light. CN III, IV, VI: extraocular movement are normal. No ptosis. CN V: Facial sensation is intact to light touch CN VII: Face is symmetric with normal eye closure  CN VIII: Hearing is normal to causal conversation. CN IX, X: Phonation is normal. CN XI: Head turning and shoulder shrug are intact  MOTOR: Mild to moderate bilateral intrinsic hand muscle atrophy, with mild to moderate finger abduction, mild grip weakness  Significant lower extremity pitting edema, no significant proximal upper extremity weakness, mild toe flexion extension weakness  REFLEXES: Bilateral knee replacement, absent deep tendon reflex  SENSORY: Intact to light touch, pinprick and vibratory sensation are intact in fingers and toes.  COORDINATION: There is no trunk or limb dysmetria noted.  GAIT/STANCE: Push-up, cautious, unsteady,  REVIEW OF SYSTEMS:  Full 14 system review of systems performed and notable only for as above All  other review of systems were negative.   ALLERGIES: Allergies  Allergen Reactions   Actos [Pioglitazone Hydrochloride] Nausea Only   Glyburide Other (See Comments)    hypoglycemia   Metformin  And Related Other (See Comments)    Hypoglycemia, shakes Fast acting metformin    Codeine Itching    HOME MEDICATIONS: Current Outpatient Medications  Medication Sig Dispense Refill   ACCU-CHEK GUIDE test strip      Accu-Chek Softclix Lancets lancets      acetaminophen  (TYLENOL ) 500 MG tablet Take 500 mg by mouth every 6 (six) hours as needed for moderate pain (BACK PAIN).     amLODipine  (NORVASC ) 10 MG tablet Take 10 mg by mouth daily.     aspirin  EC 81 MG tablet Take 81 mg by mouth daily.     B-D INS SYR ULTRAFINE 1CC/30G 30G X 1/2 1 ML MISC USE TO INJECT INSULIN  ONCE DAILY 90 each 2   chlorthalidone (HYGROTON) 25 MG tablet Take 25 mg by mouth daily.     Continuous Blood Gluc Receiver (DEXCOM G7 RECEIVER) DEVI To display CGM data 1 each 0   Continuous Blood Gluc Sensor (DEXCOM G7 SENSOR) MISC 1 Device by Does not apply route as directed. Change sensor every 10 days 3 each 3   fluticasone  (VERAMYST) 27.5 MCG/SPRAY nasal spray Place 2 sprays into the nose daily as needed for allergies.      gabapentin  (NEURONTIN ) 300 MG capsule Take 300 mg by mouth 3 (three) times daily.     insulin  NPH Human (NOVOLIN N) 100 UNIT/ML injection INJECT 70 UNITS UNDER THE SKIN IN THE MORNING. 60 mL 1   insulin  regular (NOVOLIN R) 100 units/mL injection Inject 0.1 mLs (10 Units total) into the skin 3 (three) times daily before meals. 10 mL 1   JARDIANCE 10 MG TABS tablet Take 10 mg by mouth daily.     metFORMIN  (GLUCOPHAGE ) 500 MG tablet Take 1,000 mg by mouth 2 (two) times daily with a meal.     metoprolol  tartrate (LOPRESSOR ) 25 MG tablet TAKE 1 TABLET TWICE DAILY 180 tablet 3   Multiple Vitamin (MULTIVITAMIN) tablet Take 1 tablet by mouth daily.     pantoprazole  (PROTONIX ) 40 MG tablet Take 40 mg by mouth daily.      rosuvastatin (CRESTOR) 20 MG tablet Take 20 mg by mouth at bedtime.     tamsulosin (FLOMAX) 0.4 MG CAPS capsule Take 0.8 mg by mouth daily.     telmisartan (MICARDIS) 80 MG tablet Take 80 mg by mouth daily.     No current facility-administered medications for this visit.    PAST MEDICAL HISTORY: Past Medical History:  Diagnosis Date   CAD (coronary artery disease)    Change in voice    Cough    Depression    Diabetes mellitus 04-25-11   oral meds   ED (erectile dysfunction)    Hepatic lesion    left lobe   Hyperlipidemia    Hypertension    dr regino ee   Hypothyroidism    Lung nodule    Nasal congestion    Osteoarthritis 04-25-11   spine area-some neck issues   Paroxysmal atrial fibrillation (HCC)    started on metoprolol  and Xarelto  07/02/2015 for afib RVR   Pneumonia    PONV (postoperative nausea and vomiting)    Thyroid  disease    thyroid  lesion  Tubular adenoma    polyps- Dr.Edwards    PAST SURGICAL HISTORY: Past Surgical History:  Procedure Laterality Date   CARDIAC CATHETERIZATION  04-25-11   many yrs ago-very slight blockage 1 vessel (60% RCA by Dr. Regino notes)   CHOLECYSTECTOMY N/A 08/07/2018   Procedure: LAPAROSCOPIC CHOLECYSTECTOMY;  Surgeon: Stevie Herlene Righter, MD;  Location: [facility] OR;  Service: General;  Laterality: N/A;   EYE SURGERY  11/2010   cataract x2   EYE SURGERY  12/2010   len implant x2    KNEE SURGERY  04-25-11   right/ left knee scope   THYROIDECTOMY  05/02/2011   Procedure: THYROIDECTOMY;  Surgeon: Krystal CHRISTELLA Spinner, MD;  Location: [facility] ORS;  Service: General;  Laterality: N/A;  Total Thyroidectomy   TOTAL KNEE ARTHROPLASTY Right 04/25/2013   Procedure: TOTAL KNEE ARTHROPLASTY;  Surgeon: Dempsey JINNY Sensor, MD;  Location: [facility] OR;  Service: Orthopedics;  Laterality: Right;    FAMILY HISTORY: Family History  Problem Relation Age of Onset   Diabetes Mother    Diabetes Brother    Colon cancer Brother    COPD Other    Hypertension Other     Hyperlipidemia Other    Heart attack Other    Diabetes Sister     SOCIAL HISTORY: Social History   Socioeconomic History   Marital status: Widowed    Spouse name: Not on file   Number of children: Not on file   Years of education: Not on file   Highest education level: Not on file  Occupational History   Not on file  Tobacco Use   Smoking status: Former    Current packs/day: 0.00    Types: Cigarettes    Start date: 11/25/1959    Quit date: 11/24/2009    Years since quitting: 14.0   Smokeless tobacco: Never  Vaping Use   Vaping status: Never Used  Substance and Sexual Activity   Alcohol use: Yes    Comment: Beer on weekends, 6 or more   Drug use: No   Sexual activity: Never  Other Topics Concern   Not on file  Social History Narrative   Not on file   Social Drivers of Health   Financial Resource Strain: Not on file  Food Insecurity: Not on file  Transportation Needs: Not on file  Physical Activity: Not on file  Stress: Not on file  Social Connections: Not on file  Intimate Partner Violence: Not on file      Modena Callander, M.D. Ph.D.  [facility] [address], [address] [place], [place] [address] Ph: [phone] Fax: [phone]  CC:  Margaret Eduard SAUNDERS, MD [address] [place],  [place] [address]  Regino Slater, MD

## 2023-12-06 ENCOUNTER — Telehealth: Payer: Self-pay | Admitting: Neurology

## 2023-12-06 NOTE — Telephone Encounter (Signed)
MRI orders sent to [facility] [phone]

## 2023-12-10 DIAGNOSIS — K640 First degree hemorrhoids: Secondary | ICD-10-CM | POA: Diagnosis not present

## 2023-12-10 DIAGNOSIS — D124 Benign neoplasm of descending colon: Secondary | ICD-10-CM | POA: Diagnosis not present

## 2023-12-10 DIAGNOSIS — D125 Benign neoplasm of sigmoid colon: Secondary | ICD-10-CM | POA: Diagnosis not present

## 2023-12-10 DIAGNOSIS — Z800 Family history of malignant neoplasm of digestive organs: Secondary | ICD-10-CM | POA: Diagnosis not present

## 2023-12-10 DIAGNOSIS — D128 Benign neoplasm of rectum: Secondary | ICD-10-CM | POA: Diagnosis not present

## 2023-12-10 DIAGNOSIS — Z860101 Personal history of adenomatous and serrated colon polyps: Secondary | ICD-10-CM | POA: Diagnosis not present

## 2023-12-10 DIAGNOSIS — D122 Benign neoplasm of ascending colon: Secondary | ICD-10-CM | POA: Diagnosis not present

## 2023-12-10 DIAGNOSIS — Z09 Encounter for follow-up examination after completed treatment for conditions other than malignant neoplasm: Secondary | ICD-10-CM | POA: Diagnosis not present

## 2023-12-11 DIAGNOSIS — Z6836 Body mass index (BMI) 36.0-36.9, adult: Secondary | ICD-10-CM | POA: Diagnosis not present

## 2023-12-11 DIAGNOSIS — Z23 Encounter for immunization: Secondary | ICD-10-CM | POA: Diagnosis not present

## 2023-12-11 DIAGNOSIS — Z79899 Other long term (current) drug therapy: Secondary | ICD-10-CM | POA: Diagnosis not present

## 2023-12-11 DIAGNOSIS — I10 Essential (primary) hypertension: Secondary | ICD-10-CM | POA: Diagnosis not present

## 2023-12-11 DIAGNOSIS — E7800 Pure hypercholesterolemia, unspecified: Secondary | ICD-10-CM | POA: Diagnosis not present

## 2023-12-11 DIAGNOSIS — I2510 Atherosclerotic heart disease of native coronary artery without angina pectoris: Secondary | ICD-10-CM | POA: Diagnosis not present

## 2023-12-11 DIAGNOSIS — E1142 Type 2 diabetes mellitus with diabetic polyneuropathy: Secondary | ICD-10-CM | POA: Diagnosis not present

## 2023-12-11 DIAGNOSIS — E039 Hypothyroidism, unspecified: Secondary | ICD-10-CM | POA: Diagnosis not present

## 2023-12-12 ENCOUNTER — Ambulatory Visit: Admitting: Student

## 2023-12-12 ENCOUNTER — Encounter: Payer: Self-pay | Admitting: Neurology

## 2023-12-12 DIAGNOSIS — D128 Benign neoplasm of rectum: Secondary | ICD-10-CM | POA: Diagnosis not present

## 2023-12-12 DIAGNOSIS — D124 Benign neoplasm of descending colon: Secondary | ICD-10-CM | POA: Diagnosis not present

## 2023-12-12 DIAGNOSIS — D125 Benign neoplasm of sigmoid colon: Secondary | ICD-10-CM | POA: Diagnosis not present

## 2023-12-12 DIAGNOSIS — D122 Benign neoplasm of ascending colon: Secondary | ICD-10-CM | POA: Diagnosis not present

## 2023-12-17 ENCOUNTER — Ambulatory Visit
Admission: RE | Admit: 2023-12-17 | Discharge: 2023-12-17 | Disposition: A | Discharge status: Home / Self Care | Source: Ambulatory Visit | Attending: Neurology | Admitting: Neurology

## 2023-12-17 DIAGNOSIS — G5603 Carpal tunnel syndrome, bilateral upper limbs: Secondary | ICD-10-CM

## 2023-12-17 DIAGNOSIS — M549 Dorsalgia, unspecified: Secondary | ICD-10-CM | POA: Diagnosis not present

## 2023-12-17 DIAGNOSIS — R200 Anesthesia of skin: Secondary | ICD-10-CM | POA: Diagnosis not present

## 2023-12-17 DIAGNOSIS — M542 Cervicalgia: Secondary | ICD-10-CM

## 2023-12-17 DIAGNOSIS — E1142 Type 2 diabetes mellitus with diabetic polyneuropathy: Secondary | ICD-10-CM

## 2023-12-17 DIAGNOSIS — G8929 Other chronic pain: Secondary | ICD-10-CM | POA: Diagnosis not present

## 2023-12-17 DIAGNOSIS — R269 Unspecified abnormalities of gait and mobility: Secondary | ICD-10-CM

## 2023-12-18 ENCOUNTER — Telehealth: Payer: Self-pay | Admitting: Neurology

## 2023-12-18 DIAGNOSIS — M48062 Spinal stenosis, lumbar region with neurogenic claudication: Secondary | ICD-10-CM

## 2023-12-18 DIAGNOSIS — M4802 Spinal stenosis, cervical region: Secondary | ICD-10-CM

## 2023-12-18 NOTE — Telephone Encounter (Signed)
 I called patient, MRI of cervical spine showed moderate stenosis at lower cervical region, C4-5 C5-6 C6-7  MRI of the lumbar spine showed severe stenosis at L4-5 level  He does has increased gait abnormality, abnormal EMG findings  Agree neurosurgical referral  Orders Placed This Encounter  Procedures   Ambulatory referral to Neurosurgery        IMPRESSION: This MRI of the cervical spine without contrast shows the following: The spinal cord appears normal. There is mild spinal stenosis at C3-C4 and moderate spinal stenosis at C4-C5, C5-C6 and C6-C7.  There are various degrees of foraminal narrowing moderately severe at C5-C6 with potential for C6 nerve root compression to either side.   IMPRESSION: This MRI of the lumbar spine without contrast shows the following: At L4-L5, there is broad disc herniation and advanced facet hypertrophy and other degenerative change causing very severe spinal stenosis with an AP diameter of 5.4 mm.  There is also moderately severe right foraminal narrowing and severe bilateral lateral recess stenosis which could lead to right L4 and bilateral L5 nerve root compression. At L5-S1, there are degenerative changes causing severe right foraminal narrowing and moderately severe left foraminal narrowing.  There is also moderately severe right and moderate left lateral recess stenosis.  There is mild spinal stenosis and potential for bilateral L5 right S1 nerve root compression. Milder degenerative changes at the other lumbar levels as detailed above not causing significant spinal stenosis or nerve root compression

## 2023-12-23 DIAGNOSIS — I10 Essential (primary) hypertension: Secondary | ICD-10-CM | POA: Diagnosis not present

## 2023-12-23 DIAGNOSIS — E1169 Type 2 diabetes mellitus with other specified complication: Secondary | ICD-10-CM | POA: Diagnosis not present

## 2023-12-23 DIAGNOSIS — E1142 Type 2 diabetes mellitus with diabetic polyneuropathy: Secondary | ICD-10-CM | POA: Diagnosis not present

## 2023-12-23 DIAGNOSIS — E039 Hypothyroidism, unspecified: Secondary | ICD-10-CM | POA: Diagnosis not present

## 2023-12-23 DIAGNOSIS — I2510 Atherosclerotic heart disease of native coronary artery without angina pectoris: Secondary | ICD-10-CM | POA: Diagnosis not present

## 2024-01-02 ENCOUNTER — Telehealth: Payer: Self-pay | Admitting: Neurology

## 2024-01-02 NOTE — Telephone Encounter (Signed)
 Pt called stating that  referral for Neurosurgery  was suppose to be placed  For Pt , Informed that  Referral was placed and sent thru epic to  [facility] at [facility]  gave Pt number [phone]. , Informed Pt to let me know if  They have received Referral .

## 2024-01-08 ENCOUNTER — Ambulatory Visit (HOSPITAL_COMMUNITY)

## 2024-01-08 ENCOUNTER — Other Ambulatory Visit: Payer: Self-pay

## 2024-01-08 ENCOUNTER — Encounter (HOSPITAL_COMMUNITY): Payer: Self-pay

## 2024-01-08 ENCOUNTER — Telehealth: Payer: Self-pay | Admitting: Neurology

## 2024-01-08 DIAGNOSIS — M549 Dorsalgia, unspecified: Secondary | ICD-10-CM | POA: Diagnosis not present

## 2024-01-08 DIAGNOSIS — G8929 Other chronic pain: Secondary | ICD-10-CM | POA: Insufficient documentation

## 2024-01-08 DIAGNOSIS — G5603 Carpal tunnel syndrome, bilateral upper limbs: Secondary | ICD-10-CM | POA: Diagnosis not present

## 2024-01-08 DIAGNOSIS — M48062 Spinal stenosis, lumbar region with neurogenic claudication: Secondary | ICD-10-CM

## 2024-01-08 DIAGNOSIS — Z7409 Other reduced mobility: Secondary | ICD-10-CM | POA: Diagnosis present

## 2024-01-08 DIAGNOSIS — R269 Unspecified abnormalities of gait and mobility: Secondary | ICD-10-CM | POA: Diagnosis not present

## 2024-01-08 DIAGNOSIS — E1142 Type 2 diabetes mellitus with diabetic polyneuropathy: Secondary | ICD-10-CM | POA: Diagnosis not present

## 2024-01-08 DIAGNOSIS — R200 Anesthesia of skin: Secondary | ICD-10-CM | POA: Insufficient documentation

## 2024-01-08 DIAGNOSIS — M542 Cervicalgia: Secondary | ICD-10-CM

## 2024-01-08 DIAGNOSIS — M5459 Other low back pain: Secondary | ICD-10-CM | POA: Diagnosis present

## 2024-01-08 DIAGNOSIS — M4802 Spinal stenosis, cervical region: Secondary | ICD-10-CM

## 2024-01-08 NOTE — Telephone Encounter (Signed)
 Pt seen for PT today.  Ok for referral to OT [facility] rehab at [place]?

## 2024-01-08 NOTE — Therapy (Signed)
 OUTPATIENT PHYSICAL THERAPY THORACOLUMBAR EVALUATION   Patient Name: Christopher Patel MRN: [id number] DOB:Aug 22, 1945, 78 y.o., male Today's Date: 01/08/2024  END OF SESSION:  PT End of Session - 01/08/24 1329     Visit Number 1    Date for Recertification  02/19/24    Authorization Type [employer]    Authorization Time Period seeking auth    Authorization - Visit Number 1    Progress Note Due on Visit 10    PT Start Time 1245    PT Stop Time 1328    PT Time Calculation (min) 43 min    Activity Tolerance Patient tolerated treatment well;Patient limited by pain    Behavior During Therapy [facility] for tasks assessed/performed          Past Medical History:  Diagnosis Date   CAD (coronary artery disease)    Change in voice    Cough    Depression    Diabetes mellitus 04-25-11   oral meds   ED (erectile dysfunction)    Hepatic lesion    left lobe   Hyperlipidemia    Hypertension    dr regino ee   Hypothyroidism    Lung nodule    Nasal congestion    Osteoarthritis 04-25-11   spine area-some neck issues   Paroxysmal atrial fibrillation (HCC)    started on metoprolol  and Xarelto  07/02/2015 for afib RVR   Pneumonia    PONV (postoperative nausea and vomiting)    Thyroid  disease    thyroid  lesion   Tubular adenoma    polyps- Dr.Edwards   Past Surgical History:  Procedure Laterality Date   CARDIAC CATHETERIZATION  04-25-11   many yrs ago-very slight blockage 1 vessel (60% RCA by Dr. Regino notes)   CHOLECYSTECTOMY N/A 08/07/2018   Procedure: LAPAROSCOPIC CHOLECYSTECTOMY;  Surgeon: Stevie Herlene Righter, MD;  Location: [facility] OR;  Service: General;  Laterality: N/A;   EYE SURGERY  11/2010   cataract x2   EYE SURGERY  12/2010   len implant x2    KNEE SURGERY  04-25-11   right/ left knee scope   THYROIDECTOMY  05/02/2011   Procedure: THYROIDECTOMY;  Surgeon: Krystal CHRISTELLA Spinner, MD;  Location: [facility] ORS;  Service: General;  Laterality: N/A;  Total Thyroidectomy   TOTAL KNEE  ARTHROPLASTY Right 04/25/2013   Procedure: TOTAL KNEE ARTHROPLASTY;  Surgeon: Dempsey JINNY Sensor, MD;  Location: [facility] OR;  Service: Orthopedics;  Laterality: Right;   Patient Active Problem List   Diagnosis Date Noted   DM type 2 with diabetic peripheral neuropathy (HCC) 12/05/2023   Bilateral carpal tunnel syndrome 12/05/2023   Gait abnormality 12/05/2023   Chronic neck and back pain 12/05/2023   DOE (dyspnea on exertion) 09/27/2022   Numbness and tingling of both feet 06/06/2022   Acute cholecystitis 08/07/2018   RUQ abdominal pain 08/31/2016   Transaminitis 08/31/2016   SIRS (systemic inflammatory response syndrome) (HCC) 08/31/2016   Lactic acidosis 08/31/2016   Paroxysmal atrial fibrillation with RVR (HCC) 07/02/2015   Status post right knee replacement 05/04/2013   Essential hypertension, benign 05/04/2013   Diabetes mellitus (HCC) 05/04/2013   Dyslipidemia 05/04/2013   Allergic rhinitis 05/04/2013   Abdominal pain 04/27/2013   Arthritis of right knee 04/25/2013   Hypothyroidism, postsurgical 05/17/2011   Multinodular goiter (nontoxic) 04/05/2011    PCP: Regino Slater, MD   REFERRING PROVIDER: Onita Duos, MD  REFERRING DIAG: R20.0 (ICD-10-CM) - Bilateral hand numbness E11.42 (ICD-10-CM) - DM type 2 with diabetic  peripheral neuropathy (HCC) G56.03 (ICD-10-CM) - Bilateral carpal tunnel syndrome M54.2,M54.9,G89.29 (ICD-10-CM) - Chronic neck and back pain R26.9 (ICD-10-CM) - Gait abnormality  Rationale for Evaluation and Treatment: Rehabilitation  THERAPY DIAG:  Other low back pain - Plan: PT plan of care cert/re-cert  Neck pain - Plan: PT plan of care cert/re-cert  Impaired functional mobility, balance, gait, and endurance - Plan: PT plan of care cert/re-cert  ONSET DATE: 6 months it has worsened  SUBJECTIVE:                                                                                                                                                                                            SUBJECTIVE STATEMENT: Pt states low back has been hurting for a couple of years. Pt states he has had several injections and ablasion L3-L5. Pt states he has balance issues but no falls, has been using Rolator for about a year. Pt states he has also been dealing with increased neck pain and if he moves in certain positions he will be down for a few days.   PERTINENT HISTORY:  Bilateral TKA Diabetes Thyroid  removed Neuropathy  PAIN:  Are you having pain? Yes: NPRS scale: 7/10 Pain location: low back and LLE Pain description: just real painful Aggravating factors: walking, standing a long time Relieving factors: recliner with legs elevated  PRECAUTIONS: Fall  RED FLAGS: None   WEIGHT BEARING RESTRICTIONS: No  FALLS:  Has patient fallen in last 6 months? No  LIVING ENVIRONMENT: Lives with: lives with their spouse Lives in: House/apartment Stairs: Yes: External: ramped entrance steps; on right going up Has following equipment at home: Vannie - 4 wheeled  OCCUPATION: retired  PLOF: Independent with basic ADLs, Independent with household mobility with device, and Needs assistance with homemaking  PATIENT GOALS: decreased pain, stand longer, improve balance  NEXT MD VISIT: 02/11/24  OBJECTIVE:  Note: Objective measures were completed at Evaluation unless otherwise noted.  DIAGNOSTIC FINDINGS:  IMPRESSION: This MRI of the cervical spine without contrast shows the following: The spinal cord appears normal. There is mild spinal stenosis at C3-C4 and moderate spinal stenosis at C4-C5, C5-C6 and C6-C7.  There are various degrees of foraminal narrowing moderately severe at C5-C6 with potential for C6 nerve root compression to either side.  IMPRESSION: This MRI of the lumbar spine without contrast shows the following: At L4-L5, there is broad disc herniation and advanced facet hypertrophy and other degenerative change causing very severe spinal stenosis with an  AP diameter of 5.4 mm.  There is also moderately severe right foraminal narrowing and severe bilateral lateral recess stenosis which could lead  to right L4 and bilateral L5 nerve root compression. At L5-S1, there are degenerative changes causing severe right foraminal narrowing and moderately severe left foraminal narrowing.  There is also moderately severe right and moderate left lateral recess stenosis.  There is mild spinal stenosis and potential for bilateral L5 right S1 nerve root compression. Milder degenerative changes at the other lumbar levels as detailed above not causing significant spinal stenosis or nerve root compression  PATIENT SURVEYS:  Modified Oswestry: 28 / 50 = 56.0 %       SENSATION: Light touch: Impaired tingling feeling bilaterally, compression stockings and gabapentin  help   PALPATION: Tenderness on L3-S1 CPA and L lumbar paraspinals, also tenderness to left sciatic region  LUMBAR ROM:   AROM eval  Flexion 65 degrees, increased pain, LLE  Extension 15 degrees, pain in LLE  Right lateral flexion 10 degrees, pain in LLE  Left lateral flexion 10 degrees, pain in LLE  Right rotation 50% limited  Left rotation 50% limited   (Blank rows = not tested)  LOWER EXTREMITY ROM:     Active  Right eval Left eval  Hip flexion    Hip extension    Hip abduction    Hip adduction    Hip internal rotation    Hip external rotation    Knee flexion    Knee extension    Ankle dorsiflexion    Ankle plantarflexion    Ankle inversion    Ankle eversion     (Blank rows = not tested)  LOWER EXTREMITY MMT:    MMT Right eval Left eval  Hip flexion 4 3+, pain  Hip extension 4 3+, pain  Hip abduction 4 3+, pain  Hip adduction    Hip internal rotation    Hip external rotation    Knee flexion 4 4  Knee extension 4 4  Ankle dorsiflexion 4 4  Ankle plantarflexion    Ankle inversion    Ankle eversion     (Blank rows = not tested)  LUMBAR SPECIAL TESTS:  Straight  leg raise test: Positive, left, pain in back  FUNCTIONAL TESTS:  5 times sit to stand: 14.80 seconds  Timed up and go (TUG): TBA 2 minute walk test: 325 feet, increased left hip pain  GAIT: Distance walked: 400 Assistive device utilized: Walker - 4 wheeled Level of assistance: Complete Independence Comments: trunk flexed, bilateral knee giving out at the end of ambulation.   TREATMENT DATE:  01/08/2024   Evaluation: -ROM measured, Strength assessed, HEP prescribed, pt educated on prognosis, findings, and importance of HEP compliance if given.                                                                                                                                      PATIENT EDUCATION:  Education details: Pt was educated on findings of PT evaluation, prognosis, frequency of therapy visits and rationale, attendance policy, and  HEP if given.   Person educated: Patient Education method: Explanation, Verbal cues, and Handouts Education comprehension: verbalized understanding, verbal cues required, and needs further education  HOME EXERCISE PROGRAM: Access Code: [email or web address] URL: [email or web address] Date: 01/08/2024 Prepared by: Lang Ada  Exercises - Supine Bridge  - 1 x daily - 7 x weekly - 3 sets - 10 reps - 3 hold - Supine Lower Trunk Rotation  - 1 x daily - 7 x weekly - 3 sets - 10 reps - Sit to Stand with Arms Crossed  - 1 x daily - 7 x weekly - 3 sets - 10 reps  ASSESSMENT:  CLINICAL IMPRESSION: Patient is a 78 y.o. male who was seen today for physical therapy evaluation and treatment for R20.0 (ICD-10-CM) - Bilateral hand numbness E11.42 (ICD-10-CM) - DM type 2 with diabetic peripheral neuropathy (HCC) G56.03 (ICD-10-CM) - Bilateral carpal tunnel syndrome M54.2,M54.9,G89.29 (ICD-10-CM) - Chronic neck and back pain R26.9 (ICD-10-CM) - Gait abnormality.   Patient demonstrates increased low back pain and hip, decreased LE/core strength, abnormal  pain rating with functional mobility of lumbar spine, and impaired balance. Patient also demonstrates difficulty with ambulation during today's session with Rolator, decreased stride length, knee buckling towards end of ambulation bout and decreased velocity noted. Patient also demonstrates decreased endurance and balance especially towards end of walking test with flexed trunk compensation due to low back and hip pain. Patient requires education on role of PT, importance of increased physical activity and clarification on receiving OT for UE deficits. Patient would benefit from skilled physical therapy for decreased low back pain, increased endurance with ambulation, increased LE/core strength, and balance for improved gait quality, return to higher level of function with ADLs, and progress towards therapy goals.   OBJECTIVE IMPAIRMENTS: Abnormal gait, decreased activity tolerance, decreased balance, decreased endurance, decreased knowledge of condition, decreased knowledge of use of DME, decreased mobility, difficulty walking, decreased ROM, and decreased strength.   ACTIVITY LIMITATIONS: carrying, lifting, bending, standing, squatting, sleeping, stairs, transfers, and bed mobility  PARTICIPATION LIMITATIONS: meal prep, cleaning, laundry, driving, shopping, community activity, and yard work  PERSONAL FACTORS: Age, Fitness, Past/current experiences, and Time since onset of injury/illness/exacerbation are also affecting patient's functional outcome.   REHAB POTENTIAL: Fair diabetes  CLINICAL DECISION MAKING: Stable/uncomplicated  EVALUATION COMPLEXITY: Low   GOALS: Goals reviewed with patient? No  SHORT TERM GOALS: Target date: 01/29/24  Pt will be independent with HEP in order to demonstrate participation in Physical Therapy POC.  Baseline: Goal status: INITIAL  2.  Pt will report 5/10 pain with mobility in order to demonstrate improved pain with ADLs.  Baseline: 7/10 Goal status:  INITIAL  LONG TERM GOALS: Target date: 02/19/24  Pt will improve 5TSTS by at least 2.3 seconds in order to demonstrate improved functional strength to return to desired activities.  Baseline: see objective.  Goal status: INITIAL  2.  Pt will improve 2 MWT by at least 40 feet in order to demonstrate improved functional ambulatory capacity in community setting.  Baseline: see objective.  Goal status: INITIAL  3.  Pt will improve Modified Oswestry score by at least 6 points in order to demonstrate improved pain with functional goals and outcomes. Baseline: see objective.  Goal status: INITIAL  4.  Pt will report 3/10 pain with mobility in order to demonstrate reduced pain with ADLs lasting greater than 30 minutes.  Baseline: see objective.  Goal status: INITIAL   PLAN:  PT FREQUENCY: 1-2x/week  PT DURATION: 6 weeks  PLANNED  INTERVENTIONS: 97110-Therapeutic exercises, 97530- Therapeutic activity, [id number]- Neuromuscular re-education, [phone]- Self Care, 02859- Manual therapy, [phone]- Gait training, Patient/Family education, Balance training, Stair training, Joint mobilization, Joint manipulation, Spinal manipulation, Spinal mobilization, DME instructions, Cryotherapy, and Moist heat.  PLAN FOR NEXT SESSION: TUG, assess balance, progress core and LE strengthening, progress standing and walking tolerance   Lang Ada, PT, DPT [facility] Office: [phone] 2:30 PM, 01/08/2024  Mylene Barrows Request Treatment Start Date: 01/08/24  Referring diagnosis code (ICD 10)? R20.0 (ICD-10-CM) - Bilateral hand numbness E11.42 (ICD-10-CM) - DM type 2 with diabetic peripheral neuropathy (HCC) G56.03 (ICD-10-CM) - Bilateral carpal tunnel syndrome M54.2,M54.9,G89.29 (ICD-10-CM) - Chronic neck and back pain R26.9 (ICD-10-CM) - Gait abnormality Treatment diagnosis codes (ICD 10)? (if different than referring diagnosis) M54.59; M54.2; Z74.09 What was this (referring dx) caused  by? []  Surgery []  Fall [x]  Ongoing issue []  Arthritis []  Other: ____________  Laterality: []  Rt [x]  Lt [x]  Both  Deficits: [x]  Pain [x]  Stiffness [x]  Weakness []  Edema [x]  Balance Deficits [x]  Coordination [x]  Gait Disturbance [x]  ROM []  Other   Functional Tool Score: Modified Oswestry: 28 / 50 = 56.0 %  CPT codes: See Planned Interventions listed in the Plan section of the Evaluation.

## 2024-01-08 NOTE — Telephone Encounter (Signed)
 Referral to  Occupational Therapy sent thru epic to [facility] at [facility] Phone: [phone]

## 2024-01-08 NOTE — Telephone Encounter (Signed)
Orders Placed This Encounter  ?Procedures  ? Ambulatory referral to Occupational Therapy  ? ?   ?

## 2024-01-08 NOTE — Telephone Encounter (Signed)
 KATHY from [facility] called to request for MD to place a referral for occupational; therapy to there office  so Pt can be seen for hand . Requested Referral be sent thru epic   Callback number   [phone]

## 2024-01-08 NOTE — Telephone Encounter (Signed)
 Referral placed for OT.

## 2024-01-15 ENCOUNTER — Encounter (HOSPITAL_COMMUNITY): Payer: Self-pay

## 2024-01-15 ENCOUNTER — Ambulatory Visit (HOSPITAL_COMMUNITY): Admitting: Physical Therapy

## 2024-01-25 ENCOUNTER — Encounter (HOSPITAL_COMMUNITY): Payer: Self-pay

## 2024-01-25 ENCOUNTER — Ambulatory Visit (HOSPITAL_COMMUNITY): Attending: Neurology

## 2024-01-25 DIAGNOSIS — M542 Cervicalgia: Secondary | ICD-10-CM | POA: Insufficient documentation

## 2024-01-25 DIAGNOSIS — M5459 Other low back pain: Secondary | ICD-10-CM | POA: Insufficient documentation

## 2024-01-25 DIAGNOSIS — Z7409 Other reduced mobility: Secondary | ICD-10-CM | POA: Insufficient documentation

## 2024-01-25 NOTE — Therapy (Signed)
 " OUTPATIENT PHYSICAL THERAPY THORACOLUMBAR EVALUATION   Patient Name: Christopher Patel MRN: [id number] DOB:1945-10-31, 79 y.o., male Today's Date: 01/25/2024  END OF SESSION:  PT End of Session - 01/25/24 1248     Visit Number 2    Date for Recertification  02/19/24    Authorization Type [employer]    Authorization Time Period Cohere approved 8 visits from 01/08/24-04/07/24    Authorization - Visit Number 2    Authorization - Number of Visits 8    Progress Note Due on Visit 10    PT Start Time 1249    PT Stop Time 1328    PT Time Calculation (min) 39 min    Activity Tolerance Patient tolerated treatment well;Patient limited by pain    Behavior During Therapy [facility] for tasks assessed/performed          Past Medical History:  Diagnosis Date   CAD (coronary artery disease)    Change in voice    Cough    Depression    Diabetes mellitus 04-25-11   oral meds   ED (erectile dysfunction)    Hepatic lesion    left lobe   Hyperlipidemia    Hypertension    dr regino ee   Hypothyroidism    Lung nodule    Nasal congestion    Osteoarthritis 04-25-11   spine area-some neck issues   Paroxysmal atrial fibrillation (HCC)    started on metoprolol  and Xarelto  07/02/2015 for afib RVR   Pneumonia    PONV (postoperative nausea and vomiting)    Thyroid  disease    thyroid  lesion   Tubular adenoma    polyps- Dr.Edwards   Past Surgical History:  Procedure Laterality Date   CARDIAC CATHETERIZATION  04-25-11   many yrs ago-very slight blockage 1 vessel (60% RCA by Dr. Regino notes)   CHOLECYSTECTOMY N/A 08/07/2018   Procedure: LAPAROSCOPIC CHOLECYSTECTOMY;  Surgeon: Stevie Herlene Righter, MD;  Location: [facility] OR;  Service: General;  Laterality: N/A;   EYE SURGERY  11/2010   cataract x2   EYE SURGERY  12/2010   len implant x2    KNEE SURGERY  04-25-11   right/ left knee scope   THYROIDECTOMY  05/02/2011   Procedure: THYROIDECTOMY;  Surgeon: Krystal CHRISTELLA Spinner, MD;  Location: [facility] ORS;   Service: General;  Laterality: N/A;  Total Thyroidectomy   TOTAL KNEE ARTHROPLASTY Right 04/25/2013   Procedure: TOTAL KNEE ARTHROPLASTY;  Surgeon: Dempsey JINNY Sensor, MD;  Location: [facility] OR;  Service: Orthopedics;  Laterality: Right;   Patient Active Problem List   Diagnosis Date Noted   DM type 2 with diabetic peripheral neuropathy (HCC) 12/05/2023   Bilateral carpal tunnel syndrome 12/05/2023   Gait abnormality 12/05/2023   Chronic neck and back pain 12/05/2023   DOE (dyspnea on exertion) 09/27/2022   Numbness and tingling of both feet 06/06/2022   Acute cholecystitis 08/07/2018   RUQ abdominal pain 08/31/2016   Transaminitis 08/31/2016   SIRS (systemic inflammatory response syndrome) (HCC) 08/31/2016   Lactic acidosis 08/31/2016   Paroxysmal atrial fibrillation with RVR (HCC) 07/02/2015   Status post right knee replacement 05/04/2013   Essential hypertension, benign 05/04/2013   Diabetes mellitus (HCC) 05/04/2013   Dyslipidemia 05/04/2013   Allergic rhinitis 05/04/2013   Abdominal pain 04/27/2013   Arthritis of right knee 04/25/2013   Hypothyroidism, postsurgical 05/17/2011   Multinodular goiter (nontoxic) 04/05/2011    PCP: Regino Slater, MD   REFERRING PROVIDER: Onita Duos, MD  REFERRING DIAG:  R20.0 (ICD-10-CM) - Bilateral hand numbness E11.42 (ICD-10-CM) - DM type 2 with diabetic peripheral neuropathy (HCC) G56.03 (ICD-10-CM) - Bilateral carpal tunnel syndrome M54.2,M54.9,G89.29 (ICD-10-CM) - Chronic neck and back pain R26.9 (ICD-10-CM) - Gait abnormality  Rationale for Evaluation and Treatment: Rehabilitation  THERAPY DIAG:  Other low back pain  Neck pain  Impaired functional mobility, balance, gait, and endurance  ONSET DATE: 6 months it has worsened  SUBJECTIVE:                                                                                                                                                                                           SUBJECTIVE  STATEMENT: 01/25/24:  Pain in lower back, nagging sharp pain that increases with standing or walking.Reports he was unable to make it to last apt as was limited by pain.  No reports of fall or recent change in medication.     Eval:  Pt states low back has been hurting for a couple of years. Pt states he has had several injections and ablasion L3-L5. Pt states he has balance issues but no falls, has been using Rolator for about a year. Pt states he has also been dealing with increased neck pain and if he moves in certain positions he will be down for a few days.   PERTINENT HISTORY:  Bilateral TKA Diabetes Thyroid  removed Neuropathy  PAIN:  Are you having pain? Yes: NPRS scale: 7/10 Pain location: low back and LLE Pain description: just real painful Aggravating factors: walking, standing a long time Relieving factors: recliner with legs elevated  PRECAUTIONS: Fall  RED FLAGS: None   WEIGHT BEARING RESTRICTIONS: No  FALLS:  Has patient fallen in last 6 months? No  LIVING ENVIRONMENT: Lives with: lives with their spouse Lives in: House/apartment Stairs: Yes: External: ramped entrance steps; on right going up Has following equipment at home: Vannie - 4 wheeled  OCCUPATION: retired  PLOF: Independent with basic ADLs, Independent with household mobility with device, and Needs assistance with homemaking  PATIENT GOALS: decreased pain, stand longer, improve balance  NEXT MD VISIT: 02/11/24  OBJECTIVE:  Note: Objective measures were completed at Evaluation unless otherwise noted.  DIAGNOSTIC FINDINGS:  IMPRESSION: This MRI of the cervical spine without contrast shows the following: The spinal cord appears normal. There is mild spinal stenosis at C3-C4 and moderate spinal stenosis at C4-C5, C5-C6 and C6-C7.  There are various degrees of foraminal narrowing moderately severe at C5-C6 with potential for C6 nerve root compression to either side.  IMPRESSION: This MRI of the  lumbar spine without contrast shows the following: At L4-L5, there is broad disc herniation  and advanced facet hypertrophy and other degenerative change causing very severe spinal stenosis with an AP diameter of 5.4 mm.  There is also moderately severe right foraminal narrowing and severe bilateral lateral recess stenosis which could lead to right L4 and bilateral L5 nerve root compression. At L5-S1, there are degenerative changes causing severe right foraminal narrowing and moderately severe left foraminal narrowing.  There is also moderately severe right and moderate left lateral recess stenosis.  There is mild spinal stenosis and potential for bilateral L5 right S1 nerve root compression. Milder degenerative changes at the other lumbar levels as detailed above not causing significant spinal stenosis or nerve root compression  PATIENT SURVEYS:  Modified Oswestry: 28 / 50 = 56.0 %       SENSATION: Light touch: Impaired tingling feeling bilaterally, compression stockings and gabapentin  help   PALPATION: Tenderness on L3-S1 CPA and L lumbar paraspinals, also tenderness to left sciatic region  LUMBAR ROM:   AROM eval  Flexion 65 degrees, increased pain, LLE  Extension 15 degrees, pain in LLE  Right lateral flexion 10 degrees, pain in LLE  Left lateral flexion 10 degrees, pain in LLE  Right rotation 50% limited  Left rotation 50% limited   (Blank rows = not tested)  LOWER EXTREMITY ROM:     Active  Right eval Left eval  Hip flexion    Hip extension    Hip abduction    Hip adduction    Hip internal rotation    Hip external rotation    Knee flexion    Knee extension    Ankle dorsiflexion    Ankle plantarflexion    Ankle inversion    Ankle eversion     (Blank rows = not tested)  LOWER EXTREMITY MMT:    MMT Right eval Left eval  Hip flexion 4 3+, pain  Hip extension 4 3+, pain  Hip abduction 4 3+, pain  Hip adduction    Hip internal rotation    Hip external rotation     Knee flexion 4 4  Knee extension 4 4  Ankle dorsiflexion 4 4  Ankle plantarflexion    Ankle inversion    Ankle eversion     (Blank rows = not tested)  LUMBAR SPECIAL TESTS:  Straight leg raise test: Positive, left, pain in back  FUNCTIONAL TESTS:  5 times sit to stand: 14.80 seconds  Timed up and go (TUG): 01/25/24: 15.21 with rollator, no LOB 2 minute walk test: 325 feet, increased left hip pain  01/25/24: Balance testing SLS Lt 1 reports increased pain down Lt mid thigh, Rt 2 Tandem stance Lt forward    Rt forward unable Partial tandem stance Lt forward 28 without HHA    Rt forward 30 without HHA  GAIT: Distance walked: 400 Assistive device utilized: Walker - 4 wheeled Level of assistance: Complete Independence Comments: trunk flexed, bilateral knee giving out at the end of ambulation.   TREATMENT DATE:  01/25/24: Reviewed goals Educated importance of HEP compliance for maximal benefits STS 10x no HHA, cueing for mechanics and eccentric control lowering Supine:  LTR 5x 10  Bridge 10x 5  TUG 15.21 with rollator, no HHA with STS SLS Lt 1 reports increased pain down Lt mid thigh, Rt 2 Tandem stance Lt forward    Rt forward unable Partial tandem stance Lt forward 28 without HHA    Rt forward 30 without HHA   01/08/2024   Evaluation: -ROM measured, Strength assessed, HEP prescribed, pt educated on  prognosis, findings, and importance of HEP compliance if given.                                                                                                                                      PATIENT EDUCATION:  Education details: Pt was educated on findings of PT evaluation, prognosis, frequency of therapy visits and rationale, attendance policy, and HEP if given.   Person educated: Patient Education method: Explanation, Verbal cues, and Handouts Education comprehension: verbalized understanding, verbal cues required, and needs further  education  HOME EXERCISE PROGRAM: Access Code: [email or web address] URL: [email or web address] Date: 01/08/2024 Prepared by: Lang Ada  Exercises - Supine Bridge  - 1 x daily - 7 x weekly - 3 sets - 10 reps - 3 hold - Supine Lower Trunk Rotation  - 1 x daily - 7 x weekly - 3 sets - 10 reps - Sit to Stand with Arms Crossed  - 1 x daily - 7 x weekly - 3 sets - 10 reps  ASSESSMENT:  CLINICAL IMPRESSION: 01/25/24:  Reviewed goals and educated importance of HEP compliance for maximal benefits.  Pt admits he has not began the exercises due to pain, encouraged to begin and completely regularly for strengthening.  Reviewed current exercise program, pt able to complete all exercises with no reports of increased pain.  TUG complete with use of rollator and able to complete 15.21 with no LOB. SLS very difficult Lt >Rt LE with max SLS at 2.  Unable to hold tandem stance without HHA, increase ease with partial tandem stance without HHA.  Pt did report radicular symptoms Lt LE posterior mid thigh during SLS that was resolved following.  Pt did c/o neuropathy BUE and LE today. No reports of increased pain through session.  Eval:  Patient is a 79 y.o. male who was seen today for physical therapy evaluation and treatment for R20.0 (ICD-10-CM) - Bilateral hand numbness E11.42 (ICD-10-CM) - DM type 2 with diabetic peripheral neuropathy (HCC) G56.03 (ICD-10-CM) - Bilateral carpal tunnel syndrome M54.2,M54.9,G89.29 (ICD-10-CM) - Chronic neck and back pain R26.9 (ICD-10-CM) - Gait abnormality.   Patient demonstrates increased low back pain and hip, decreased LE/core strength, abnormal pain rating with functional mobility of lumbar spine, and impaired balance. Patient also demonstrates difficulty with ambulation during today's session with Rolator, decreased stride length, knee buckling towards end of ambulation bout and decreased velocity noted. Patient also demonstrates decreased endurance and balance especially  towards end of walking test with flexed trunk compensation due to low back and hip pain. Patient requires education on role of PT, importance of increased physical activity and clarification on receiving OT for UE deficits. Patient would benefit from skilled physical therapy for decreased low back pain, increased endurance with ambulation, increased LE/core strength, and balance for improved gait quality, return to higher level of function with ADLs, and progress towards therapy goals.   OBJECTIVE IMPAIRMENTS: Abnormal gait, decreased activity  tolerance, decreased balance, decreased endurance, decreased knowledge of condition, decreased knowledge of use of DME, decreased mobility, difficulty walking, decreased ROM, and decreased strength.   ACTIVITY LIMITATIONS: carrying, lifting, bending, standing, squatting, sleeping, stairs, transfers, and bed mobility  PARTICIPATION LIMITATIONS: meal prep, cleaning, laundry, driving, shopping, community activity, and yard work  PERSONAL FACTORS: Age, Fitness, Past/current experiences, and Time since onset of injury/illness/exacerbation are also affecting patient's functional outcome.   REHAB POTENTIAL: Fair diabetes  CLINICAL DECISION MAKING: Stable/uncomplicated  EVALUATION COMPLEXITY: Low   GOALS: Goals reviewed with patient? No  SHORT TERM GOALS: Target date: 01/29/24  Pt will be independent with HEP in order to demonstrate participation in Physical Therapy POC.  Baseline: Goal status: INITIAL  2.  Pt will report 5/10 pain with mobility in order to demonstrate improved pain with ADLs.  Baseline: 7/10 Goal status: INITIAL  LONG TERM GOALS: Target date: 02/19/24  Pt will improve 5TSTS by at least 2.3 seconds in order to demonstrate improved functional strength to return to desired activities.  Baseline: see objective.  Goal status: INITIAL  2.  Pt will improve 2 MWT by at least 40 feet in order to demonstrate improved functional ambulatory  capacity in community setting.  Baseline: see objective.  Goal status: INITIAL  3.  Pt will improve Modified Oswestry score by at least 6 points in order to demonstrate improved pain with functional goals and outcomes. Baseline: see objective.  Goal status: INITIAL  4.  Pt will report 3/10 pain with mobility in order to demonstrate reduced pain with ADLs lasting greater than 30 minutes.  Baseline: see objective.  Goal status: INITIAL   PLAN:  PT FREQUENCY: 1-2x/week  PT DURATION: 6 weeks  PLANNED INTERVENTIONS: 97110-Therapeutic exercises, 97530- Therapeutic activity, 97112- Neuromuscular re-education, 97535- Self Care, [id number]- Manual therapy, [phone]- Gait training, Patient/Family education, Balance training, Stair training, Joint mobilization, Joint manipulation, Spinal manipulation, Spinal mobilization, DME instructions, Cryotherapy, and Moist heat.  PLAN FOR NEXT SESSION: Progress core and LE strengthening, progress standing and walking tolerance  Augustin Mclean, LPTA/CLT; CBIS [phone]  1:47 PM, 01/25/2024    "

## 2024-01-31 ENCOUNTER — Ambulatory Visit (HOSPITAL_COMMUNITY): Admitting: Physical Therapy

## 2024-01-31 ENCOUNTER — Telehealth (HOSPITAL_COMMUNITY): Payer: Self-pay | Admitting: Physical Therapy

## 2024-01-31 NOTE — Telephone Encounter (Signed)
 Pt did not show for appt. Called and spoke to pt who states he has been sick, coughing and forgot about his appt.    Christopher Patel, PTA/CLT [facility] Ph: [phone]

## 2024-02-04 ENCOUNTER — Ambulatory Visit (HOSPITAL_COMMUNITY)

## 2024-02-06 ENCOUNTER — Ambulatory Visit (HOSPITAL_COMMUNITY): Admitting: Physical Therapy

## 2024-02-07 NOTE — Progress Notes (Signed)
 "  Referring Physician:  Onita Duos, MD [address] [place],  [place] [address]  Primary Physician:  Christopher Slater, MD  History of Present Illness: 02/11/2024 [name] has a history of HTN, paroxysmal afib, multinodular goiter, postsurgery hypothyroidism, DM with peripheral neuropathy, dyslipidemia, CAD, depression.   He has constant LBP with intermittent bilateral posterior leg pain to his knee and sometimes to his feet. LBP > leg pain, right leg pain = left leg pain. He has known neuropathy in his hands/feet with numbness/tingling. Pain is worse with standing and walking. Grocery cart helps. Pain is better with rest. Uses a walker to ambulate.   He has constant neck pain that is more of a stiffness that radiates into his left shoulder. No arm pain. He has numbness/tingling in his hands that is constant. He drops things and has some dexterity issues due to numbness in fingers.   His primary complaint is his inability to walk any distance due to pain.   Tobacco use: Does not smoke.   Bowel/Bladder Dysfunction: none, he's had bowel urgency since his gallbladder was out 4-5 years ago.   Conservative measures:  Physical therapy: has participated with [facility] for 2 visits 01/08/24, 01/25/24  for his back- the rest of the appointments were cancelled due to illness Multimodal medical therapy including regular antiinflammatories:  Tylenol , Gabapentin  Injections:  No cervical injections Multiple lumbar injections with short term relief Lumbar RFA with Dr. Cyrena with short term relief  Past Surgery: no spine surgeries  The symptoms are causing a significant impact on the patient's life.   Review of Systems:  A 10 point review of systems is negative, except for the pertinent positives and negatives detailed in the HPI.  Past Medical History: Past Medical History:  Diagnosis Date   CAD (coronary artery disease)    Change in voice    Cough    Depression    Diabetes mellitus  04-25-11   oral meds   ED (erectile dysfunction)    Hepatic lesion    left lobe   Hyperlipidemia    Hypertension    dr Christopher ee   Hypothyroidism    Lung nodule    Nasal congestion    Osteoarthritis 04-25-11   spine area-some neck issues   Paroxysmal atrial fibrillation (HCC)    started on metoprolol  and Xarelto  07/02/2015 for afib RVR   Pneumonia    PONV (postoperative nausea and vomiting)    Thyroid  disease    thyroid  lesion   Tubular adenoma    polyps- Dr.Edwards    Past Surgical History: Past Surgical History:  Procedure Laterality Date   CARDIAC CATHETERIZATION  04-25-11   many yrs ago-very slight blockage 1 vessel (60% RCA by Dr. Regino notes)   CHOLECYSTECTOMY N/A 08/07/2018   Procedure: LAPAROSCOPIC CHOLECYSTECTOMY;  Surgeon: Stevie Herlene Righter, MD;  Location: [facility] OR;  Service: General;  Laterality: N/A;   EYE SURGERY  11/2010   cataract x2   EYE SURGERY  12/2010   len implant x2    KNEE SURGERY  04-25-11   right/ left knee scope   THYROIDECTOMY  05/02/2011   Procedure: THYROIDECTOMY;  Surgeon: Christopher CHRISTELLA Spinner, MD;  Location: [facility];  Service: General;  Laterality: N/A;  Total Thyroidectomy   TOTAL KNEE ARTHROPLASTY Right 04/25/2013   Procedure: TOTAL KNEE ARTHROPLASTY;  Surgeon: Christopher JINNY Sensor, MD;  Location: [facility] OR;  Service: Orthopedics;  Laterality: Right;    Allergies: Allergies as of 02/11/2024 -  Review Complete 02/11/2024  Allergen Reaction Noted   Actos [pioglitazone hydrochloride] Nausea Only 04/05/2011   Glyburide Other (See Comments) 04/05/2011   Metformin  and related Other (See Comments) 04/05/2011   Codeine Itching 03/23/2011    Medications: Outpatient Encounter Medications as of 02/11/2024  Medication Sig   ACCU-CHEK GUIDE test strip    Accu-Chek Softclix Lancets lancets    acetaminophen  (TYLENOL ) 500 MG tablet Take 500 mg by mouth every 6 (six) hours as needed for moderate pain (BACK PAIN).   ALPRAZolam  (XANAX ) 1 MG tablet Take 1 tablet (1 mg  total) by mouth at bedtime as needed for anxiety.   amLODipine  (NORVASC ) 10 MG tablet Take 10 mg by mouth daily.   aspirin  EC 81 MG tablet Take 81 mg by mouth daily.   B-D INS SYR ULTRAFINE 1CC/30G 30G X 1/2 1 ML MISC USE TO INJECT INSULIN  ONCE DAILY   chlorthalidone (HYGROTON) 25 MG tablet Take 25 mg by mouth daily.   Continuous Blood Gluc Receiver (DEXCOM G7 RECEIVER) DEVI To display CGM data   Continuous Blood Gluc Patel (DEXCOM G7 Patel) MISC 1 Device by Does not apply route as directed. Change Patel every 10 days   fluticasone  (VERAMYST) 27.5 MCG/SPRAY nasal spray Place 2 sprays into the nose daily as needed for allergies.    gabapentin  (NEURONTIN ) 300 MG capsule Take 300 mg by mouth 3 (three) times daily.   insulin  NPH Human (NOVOLIN N) 100 UNIT/ML injection INJECT 70 UNITS UNDER THE SKIN IN THE MORNING.   insulin  regular (NOVOLIN R) 100 units/mL injection Inject 0.1 mLs (10 Units total) into the skin 3 (three) times daily before meals.   JARDIANCE 10 MG TABS tablet Take 10 mg by mouth daily.   metFORMIN  (GLUCOPHAGE ) 500 MG tablet Take 1,000 mg by mouth 2 (two) times daily with a meal.   metoprolol  tartrate (LOPRESSOR ) 25 MG tablet TAKE 1 TABLET TWICE DAILY   Multiple Vitamin (MULTIVITAMIN) tablet Take 1 tablet by mouth daily.   pantoprazole  (PROTONIX ) 40 MG tablet Take 40 mg by mouth daily.   rosuvastatin (CRESTOR) 20 MG tablet Take 20 mg by mouth at bedtime.   tamsulosin (FLOMAX) 0.4 MG CAPS capsule Take 0.8 mg by mouth daily.   telmisartan (MICARDIS) 80 MG tablet Take 80 mg by mouth daily.   No facility-administered encounter medications on file as of 02/11/2024.    Social History: Social History[1]  Family Medical History: Family History  Problem Relation Age of Onset   Diabetes Mother    Diabetes Brother    Colon cancer Brother    COPD Other    Hypertension Other    Hyperlipidemia Other    Heart attack Other    Diabetes Sister     Physical Examination: Vitals:    02/11/24 1307  BP: 136/70    General: Patient is well developed, well nourished, calm, collected, and in no apparent distress. Attention to examination is appropriate.  Respiratory: Patient is breathing without any difficulty.   NEUROLOGICAL:     Awake, alert, oriented to person, place, and time.  Speech is clear and fluent. Fund of knowledge is appropriate.   Cranial Nerves: Pupils equal round and reactive to light.  Facial tone is symmetric.    No posterior cervical tenderness. No tenderness in bilateral trapezial region.   Diffuse lower posterior lumbar tenderness.   No abnormal lesions on exposed skin.   Strength: Side Biceps Triceps Deltoid Interossei Grip Wrist Ext. Wrist Flex.  R 5 5 5 5  5  5 5  L 5 5 5 5 5 5 5    Side Iliopsoas Quads Hamstring PF DF EHL  R 5 5 5 5 5 5   L 5 5 5 5 5 5    Reflexes are 2+ and symmetric at the biceps, brachioradialis, patella and achilles.   Hoffman's is absent.  Clonus is not present.   Bilateral upper and lower extremity sensation is intact to light touch.     No pain with IR/ER of both hips.   He has limited ROM of both shoulders with pain on left.   He ambulates slowly with walker.   Medical Decision Making  Imaging: Cervical MRI dated 12/17/23:  FINDINGS: :  On sagittal images, the spine is imaged from above the cervicomedullary junction to T2.   The visible brain and the cervicomedullary junction appears normal.  Paravertebral soft tissue appears normal.  The spinal cord is of normal caliber and signal.   The vertebral bodies are normally aligned.   The vertebral bodies have normal signal.     The discs and interspaces were further evaluated on axial views from C2 to T2 as follows:   C2-C3: This level is unremarkable.   C3-C4: There is mild spinal stenosis due to disc bulging, minimal facet hypertrophy and ligamenta flava hypertrophy.  There is mild foraminal narrowing but no nerve root compression.   C4-C5: There is moderate  spinal stenosis due to disc protrusion, facet hypertrophy and ligamentum flavum hypertrophy.  There is moderate right greater than left foraminal narrowing though no definite nerve root compression.   C5-C6: There is moderate spinal stenosis due to disc protrusion, uncovertebral spurring, facet hypertrophy and ligamenta flava hypertrophy.  There is moderately severe bilateral foraminal narrowing that could affect the C6 nerve roots.   C6-C7: There is moderate spinal stenosis due to disc protrusion, facet hypertrophy and ligamenta flava hypertrophy.  This causes moderate right and mild left foraminal narrowing for compression.   C7-T1: This level is unremarkable.   T1-T2: Minimal disc bulging but no spinal stenosis, foraminal narrowing or nerve root compression.     IMPRESSION: This MRI of the cervical spine without contrast shows the following: The spinal cord appears normal. There is mild spinal stenosis at C3-C4 and moderate spinal stenosis at C4-C5, C5-C6 and C6-C7.  There are various degrees of foraminal narrowing moderately severe at C5-C6 with potential for C6 nerve root compression to either side.     INTERPRETING PHYSICIAN:  Richard A. Vear, MD, PhD, FAAN Certified in  Neuroimaging by [employer]   Lumbar MRI dated 12/17/23:  FINDINGS: On sagittal images, the spine is imaged from T11 to the sacrum.   The conus medullaris and cauda equine appear normal.   The vertebral bodies are normally aligned.   The vertebral bodies have normal signal.     The discs and interspaces were further evaluated on axial views from T12 to S1 as follows:   T11-T12: This is on sagittal views only.  There is disc bulging causing mild right foraminal narrowing but no spinal stenosis or nerve root compression.   T12-L1: This level is unremarkable.   L1-L2: There is broad disc bulging and mild right facet hypertrophy causing minimal foraminal narrowing and mild lateral recess stenosis  but no spinal stenosis or nerve root compression.   L2-L3: There is broad disc bulging, facet hypertrophy and ligamenta flava hypertrophy causing minimal foraminal narrowing, mild bilateral lateral recess stenosis but no spinal stenosis or nerve root compression.  L3-L4: There is disc bulging, facet hypertrophy and ligamenta flava hypertrophy causing mild spinal stenosis, mild foraminal narrowing and mild to moderate bilateral lateral recess stenosis but no nerve root compression.   L4-L5: There is broad disc herniation, advanced facet hypertrophy, small joint effusions and ligamenta flava hypertrophy causing very severe spinal stenosis (AP of 5.4 mm and transverse diameter  less than 6 mm) moderate left foraminal narrowing, moderately severe right foraminal narrowing and severe bilateral lateral recess stenosis.  This could lead to right L4 and bilateral L5 nerve root compression.   L5-S1: There is central disc protrusion, facet hypertrophy and small joint effusion causing severe right, moderately severe left foraminal narrowing, moderately severe right and moderate left lateral recess stenosis and mild spinal stenosis in the transverse diameter.  There is potential for bilateral L5 and right S1 nerve root compression.   IMPRESSION: This MRI of the lumbar spine without contrast shows the following: At L4-L5, there is broad disc herniation and advanced facet hypertrophy and other degenerative change causing very severe spinal stenosis with an AP diameter of 5.4 mm.  There is also moderately severe right foraminal narrowing and severe bilateral lateral recess stenosis which could lead to right L4 and bilateral L5 nerve root compression. At L5-S1, there are degenerative changes causing severe right foraminal narrowing and moderately severe left foraminal narrowing.  There is also moderately severe right and moderate left lateral recess stenosis.  There is mild spinal stenosis and potential for bilateral  L5 right S1 nerve root compression. Milder degenerative changes at the other lumbar levels as detailed above not causing significant spinal stenosis or nerve root compression       INTERPRETING PHYSICIAN:  Richard A. Vear, MD, PhD, FAAN Certified in  Neuroimaging by [employer]  I have personally reviewed the images and agree with the above interpretation.   EMG of bilateral upper and lower extremities dated 12/05/23:  Conclusion: This is an  abnormal study.  There is electrodiagnostic evidence of moderately severe peripheral neuropathy with superimposed bilateral carpal tunnel syndromes.  There is also evidence of chronic bilateral cervical radiculopathy involving bilateral C5, 6, 7 T1 myotomes, also chronic left L4-5 S1 radiculopathy.       ------------------------------- Modena Callander. M.D. Ph.D.   Assessment and Plan: Mr. Mato has a primary complaint of not being able to walk any significant distance due to pain.   He has constant LBP with intermittent bilateral posterior leg pain to his knee and sometimes to his feet. LBP > leg pain, right leg pain = left leg pain. Pain is worse with standing and walking. Grocery cart helps.   He has known mild central stenosis L3-L4 and L5-S1 along with severe central stenosis L4-L5. He has multilevel foraminal stenosis L4-S1 as well.   EMG showed moderately severe peripheral neuropathy with chronic left L4-5 S1 radiculopathy.   He has constant neck pain that is more of a stiffness that radiates into his left shoulder. No arm pain. He has numbness/tingling in his hands that is constant. He drops things and has some dexterity issues due to numbness in fingers.   He has known multilevel foraminal stenosis in cervical spine. He has severe central stenosis C4-C5, moderate/severe central stenosis C5-C6, and moderate central stenosis C6-C7.   EMG showed severe peripheral neuropathy with superimposed bilateral carpal tunnel  syndrome along with chronic bilateral cervical radiculopathy involving bilateral C5, 6, 7 T1 myotomes.   He also has left shoulder pain with limited ROM- this is  likely left shoulder mediated.   Treatment options discussed with patient and following plan made:   - Cervical and lumbar xrays with flex/ext done on his way out today. Dr. Claudene will review with him at his follow up.  - Recommend he follow up with Dr. Claudene for evaluation of severe central stenosis in cervical spine.  - Discussed with patient that we may need to address his cervical spine prior to addressing lumbar spine.  - Symptoms are complicated by known peripheral neuropathy, cervical/lumbar radiculopathy, and bilateral carpal tunnel syndrome.  - He is not interested in any further injections in his lumbar spine. He may need to revisit PT, but will discuss at his follow up with Dr. Claudene.   I spent a total of 45 minutes in face-to-face and non-face-to-face activities related to this patient's care today including review of outside records, review of imaging, review of symptoms, physical exam, discussion of differential diagnosis, discussion of treatment options, and documentation.   Thank you for involving me in the care of this patient.   Glade Boys PA-C Dept. of Neurosurgery      [1]  Social History Tobacco Use   Smoking status: Former    Current packs/day: 0.00    Types: Cigarettes    Start date: 11/25/1959    Quit date: 11/24/2009    Years since quitting: 14.2   Smokeless tobacco: Never  Vaping Use   Vaping status: Never Used  Substance Use Topics   Alcohol use: Yes    Comment: Beer on weekends, 6 or more   Drug use: No   "

## 2024-02-08 ENCOUNTER — Ambulatory Visit (HOSPITAL_COMMUNITY)

## 2024-02-11 ENCOUNTER — Encounter: Payer: Self-pay | Admitting: Orthopedic Surgery

## 2024-02-11 ENCOUNTER — Ambulatory Visit

## 2024-02-11 ENCOUNTER — Ambulatory Visit: Admitting: Orthopedic Surgery

## 2024-02-11 VITALS — BP 136/70 | Ht 69.0 in | Wt 230.2 lb

## 2024-02-11 DIAGNOSIS — M25512 Pain in left shoulder: Secondary | ICD-10-CM

## 2024-02-11 DIAGNOSIS — M5412 Radiculopathy, cervical region: Secondary | ICD-10-CM

## 2024-02-11 DIAGNOSIS — G5603 Carpal tunnel syndrome, bilateral upper limbs: Secondary | ICD-10-CM | POA: Diagnosis not present

## 2024-02-11 DIAGNOSIS — M47812 Spondylosis without myelopathy or radiculopathy, cervical region: Secondary | ICD-10-CM

## 2024-02-11 DIAGNOSIS — M4722 Other spondylosis with radiculopathy, cervical region: Secondary | ICD-10-CM | POA: Diagnosis not present

## 2024-02-11 DIAGNOSIS — M545 Low back pain, unspecified: Secondary | ICD-10-CM | POA: Diagnosis not present

## 2024-02-11 DIAGNOSIS — M48062 Spinal stenosis, lumbar region with neurogenic claudication: Secondary | ICD-10-CM

## 2024-02-11 DIAGNOSIS — E1142 Type 2 diabetes mellitus with diabetic polyneuropathy: Secondary | ICD-10-CM

## 2024-02-11 DIAGNOSIS — M4802 Spinal stenosis, cervical region: Secondary | ICD-10-CM | POA: Diagnosis not present

## 2024-02-11 DIAGNOSIS — M47816 Spondylosis without myelopathy or radiculopathy, lumbar region: Secondary | ICD-10-CM

## 2024-02-11 DIAGNOSIS — M4726 Other spondylosis with radiculopathy, lumbar region: Secondary | ICD-10-CM

## 2024-02-11 DIAGNOSIS — M5416 Radiculopathy, lumbar region: Secondary | ICD-10-CM

## 2024-02-11 DIAGNOSIS — M542 Cervicalgia: Secondary | ICD-10-CM

## 2024-02-11 NOTE — Patient Instructions (Signed)
 It was so nice to see you today. Thank you so much for coming in.    You have arthritis in your neck and this is likely causing your neck pain. You also have spinal stenosis (pressure on the spinal cord).   Your left shoulder pain when you move your arm is likely from your shoulder.   You have arthritis in your back along with spinal stenosis (pressure on spinal cord)- this is likely contributing to your difficulty walking.   Your nerve test also showed neuropathy in your arms and legs.   I had you get xrays of your neck and your back today.   I scheduled you to see Dr. Claudene in 2 weeks- I want him to get his opinion regarding surgery on your neck. May need to do this before any back surgery.   Please do not hesitate to call if you have any questions or concerns. You can also message me in MyChart.   Glade Boys PA-C [phone]     The physicians and staff at [facility] at [facility] are committed to providing excellent care. You may receive a survey asking for feedback about your experience at our office. We value you your feedback and appreciate you taking the time to to fill it out. The [facility] leadership team is also available to discuss your experience in person, feel free to contact us  [phone].

## 2024-02-14 NOTE — Progress Notes (Unsigned)
 "   Referring Physician:  Regino Slater, MD [address] [place],  [place] [address]  Primary Physician:  Regino Slater, MD  History of Present Illness: 02/14/2024 Christopher Patel is here today with a chief complaint of ***    History of Present Illness: 02/11/2024 Note from [facility], [place] Christopher Patel has a history of HTN, paroxysmal afib, multinodular goiter, postsurgery hypothyroidism, DM with peripheral neuropathy, dyslipidemia, CAD, depression.   He has constant LBP with intermittent bilateral posterior leg pain to his knee and sometimes to his feet. LBP > leg pain, right leg pain = left leg pain. He has known neuropathy in his hands/feet with numbness/tingling. Pain is worse with standing and walking. Grocery cart helps. Pain is better with rest. Uses a walker to ambulate.   He has constant neck pain that is more of a stiffness that radiates into his left shoulder. No arm pain. He has numbness/tingling in his hands that is constant. He drops things and has some dexterity issues due to numbness in fingers.   His primary complaint is his inability to walk any distance due to pain.   Tobacco use: Does not smoke.   Bowel/Bladder Dysfunction: none, he's had bowel urgency since his gallbladder was out 4-5 years ago.   Conservative measures:  Physical therapy: has participated with [facility] for 2 visits 01/08/24, 01/25/24  for his back- the rest of the appointments were cancelled due to illness Multimodal medical therapy including regular antiinflammatories:  Tylenol , Gabapentin  Injections:  No cervical injections Multiple lumbar injections with short term relief Lumbar RFA with Dr. Cyrena with short term relief  Past Surgery: no spine surgeries  The symptoms are causing a significant impact on the patient's life.   Review of Systems:  A 10 point review of systems is negative, except for the pertinent positives and negatives detailed in the HPI.  Past Medical  History: Past Medical History:  Diagnosis Date   CAD (coronary artery disease)    Change in voice    Cough    Depression    Diabetes mellitus 04-25-11   oral meds   ED (erectile dysfunction)    Hepatic lesion    left lobe   Hyperlipidemia    Hypertension    dr regino ee   Hypothyroidism    Lung nodule    Nasal congestion    Osteoarthritis 04-25-11   spine area-some neck issues   Paroxysmal atrial fibrillation (HCC)    started on metoprolol  and Xarelto  07/02/2015 for afib RVR   Pneumonia    PONV (postoperative nausea and vomiting)    Thyroid  disease    thyroid  lesion   Tubular adenoma    polyps- Dr.Edwards    Past Surgical History: Past Surgical History:  Procedure Laterality Date   CARDIAC CATHETERIZATION  04-25-11   many yrs ago-very slight blockage 1 vessel (60% RCA by Dr. Regino notes)   CHOLECYSTECTOMY N/A 08/07/2018   Procedure: LAPAROSCOPIC CHOLECYSTECTOMY;  Surgeon: Stevie Herlene Righter, MD;  Location: [facility] OR;  Service: General;  Laterality: N/A;   EYE SURGERY  11/2010   cataract x2   EYE SURGERY  12/2010   len implant x2    KNEE SURGERY  04-25-11   right/ left knee scope   THYROIDECTOMY  05/02/2011   Procedure: THYROIDECTOMY;  Surgeon: Krystal CHRISTELLA Spinner, MD;  Location: [facility] ORS;  Service: General;  Laterality: N/A;  Total Thyroidectomy   TOTAL KNEE ARTHROPLASTY Right 04/25/2013   Procedure: TOTAL  KNEE ARTHROPLASTY;  Surgeon: Dempsey JINNY Sensor, MD;  Location: [facility] OR;  Service: Orthopedics;  Laterality: Right;    Allergies: Allergies as of 02/11/2024 - Review Complete 02/11/2024  Allergen Reaction Noted   Actos [pioglitazone hydrochloride] Nausea Only 04/05/2011   Glyburide Other (See Comments) 04/05/2011   Metformin  and related Other (See Comments) 04/05/2011   Codeine Itching 03/23/2011    Medications: Outpatient Encounter Medications as of 02/11/2024  Medication Sig   ACCU-CHEK GUIDE test strip    Accu-Chek Softclix Lancets lancets    acetaminophen  (TYLENOL ) 500 MG  tablet Take 500 mg by mouth every 6 (six) hours as needed for moderate pain (BACK PAIN).   ALPRAZolam  (XANAX ) 1 MG tablet Take 1 tablet (1 mg total) by mouth at bedtime as needed for anxiety.   amLODipine  (NORVASC ) 10 MG tablet Take 10 mg by mouth daily.   aspirin  EC 81 MG tablet Take 81 mg by mouth daily.   B-D INS SYR ULTRAFINE 1CC/30G 30G X 1/2 1 ML MISC USE TO INJECT INSULIN  ONCE DAILY   chlorthalidone (HYGROTON) 25 MG tablet Take 25 mg by mouth daily.   Continuous Blood Gluc Receiver (DEXCOM G7 RECEIVER) DEVI To display CGM data   Continuous Blood Gluc Sensor (DEXCOM G7 SENSOR) MISC 1 Device by Does not apply route as directed. Change sensor every 10 days   fluticasone  (VERAMYST) 27.5 MCG/SPRAY nasal spray Place 2 sprays into the nose daily as needed for allergies.    gabapentin  (NEURONTIN ) 300 MG capsule Take 300 mg by mouth 3 (three) times daily.   insulin  NPH Human (NOVOLIN N) 100 UNIT/ML injection INJECT 70 UNITS UNDER THE SKIN IN THE MORNING.   insulin  regular (NOVOLIN R) 100 units/mL injection Inject 0.1 mLs (10 Units total) into the skin 3 (three) times daily before meals.   JARDIANCE 10 MG TABS tablet Take 10 mg by mouth daily.   metFORMIN  (GLUCOPHAGE ) 500 MG tablet Take 1,000 mg by mouth 2 (two) times daily with a meal.   metoprolol  tartrate (LOPRESSOR ) 25 MG tablet TAKE 1 TABLET TWICE DAILY   Multiple Vitamin (MULTIVITAMIN) tablet Take 1 tablet by mouth daily.   pantoprazole  (PROTONIX ) 40 MG tablet Take 40 mg by mouth daily.   rosuvastatin (CRESTOR) 20 MG tablet Take 20 mg by mouth at bedtime.   tamsulosin (FLOMAX) 0.4 MG CAPS capsule Take 0.8 mg by mouth daily.   telmisartan (MICARDIS) 80 MG tablet Take 80 mg by mouth daily.   No facility-administered encounter medications on file as of 02/11/2024.    Social History: [Social History]  [Social History] Tobacco Use   Smoking status: Former    Current packs/day: 0.00    Types: Cigarettes    Start date: 11/25/1959    Quit  date: 11/24/2009    Years since quitting: 14.2   Smokeless tobacco: Never  Vaping Use   Vaping status: Never Used  Substance Use Topics   Alcohol use: Yes    Comment: Beer on weekends, 6 or more   Drug use: No    Family Medical History: Family History  Problem Relation Age of Onset   Diabetes Mother    Diabetes Brother    Colon cancer Brother    COPD Other    Hypertension Other    Hyperlipidemia Other    Heart attack Other    Diabetes Sister     Physical Examination: Vitals:   02/11/24 1307  BP: 136/70    General: Patient is well developed, well nourished, calm, collected, and  in no apparent distress. Attention to examination is appropriate.  Respiratory: Patient is breathing without any difficulty.   NEUROLOGICAL:     Awake, alert, oriented to person, place, and time.  Speech is clear and fluent. Fund of knowledge is appropriate.   Cranial Nerves: Pupils equal round and reactive to light.  Facial tone is symmetric.    No posterior cervical tenderness. No tenderness in bilateral trapezial region.   Diffuse lower posterior lumbar tenderness.   No abnormal lesions on exposed skin.   Strength: Side Biceps Triceps Deltoid Interossei Grip Wrist Ext. Wrist Flex.  R 5 5 5 5 5 5 5   L 5 5 5 5 5 5 5    Side Iliopsoas Quads Hamstring PF DF EHL  R 5 5 5 5 5 5   L 5 5 5 5 5 5    Reflexes are 2+ and symmetric at the biceps, brachioradialis, patella and achilles.   Hoffman's is absent.  Clonus is not present.   Bilateral upper and lower extremity sensation is intact to light touch.     No pain with IR/ER of both hips.   He has limited ROM of both shoulders with pain on left.   He ambulates slowly with walker.   Medical Decision Making  Imaging: Cervical MRI dated 12/17/23:  FINDINGS: :  On sagittal images, the spine is imaged from above the cervicomedullary junction to T2.   The visible brain and the cervicomedullary junction appears normal.  Paravertebral soft  tissue appears normal.  The spinal cord is of normal caliber and signal.   The vertebral bodies are normally aligned.   The vertebral bodies have normal signal.     The discs and interspaces were further evaluated on axial views from C2 to T2 as follows:   C2-C3: This level is unremarkable.   C3-C4: There is mild spinal stenosis due to disc bulging, minimal facet hypertrophy and ligamenta flava hypertrophy.  There is mild foraminal narrowing but no nerve root compression.   C4-C5: There is moderate spinal stenosis due to disc protrusion, facet hypertrophy and ligamentum flavum hypertrophy.  There is moderate right greater than left foraminal narrowing though no definite nerve root compression.   C5-C6: There is moderate spinal stenosis due to disc protrusion, uncovertebral spurring, facet hypertrophy and ligamenta flava hypertrophy.  There is moderately severe bilateral foraminal narrowing that could affect the C6 nerve roots.   C6-C7: There is moderate spinal stenosis due to disc protrusion, facet hypertrophy and ligamenta flava hypertrophy.  This causes moderate right and mild left foraminal narrowing for compression.   C7-T1: This level is unremarkable.   T1-T2: Minimal disc bulging but no spinal stenosis, foraminal narrowing or nerve root compression.     IMPRESSION: This MRI of the cervical spine without contrast shows the following: The spinal cord appears normal. There is mild spinal stenosis at C3-C4 and moderate spinal stenosis at C4-C5, C5-C6 and C6-C7.  There are various degrees of foraminal narrowing moderately severe at C5-C6 with potential for C6 nerve root compression to either side.     INTERPRETING PHYSICIAN:  Richard A. Vear, MD, PhD, FAAN Certified in  Neuroimaging by [employer]   Lumbar MRI dated 12/17/23:  FINDINGS: On sagittal images, the spine is imaged from T11 to the sacrum.   The conus medullaris and cauda equine appear normal.   The  vertebral bodies are normally aligned.   The vertebral bodies have normal signal.     The discs and interspaces were  further evaluated on axial views from T12 to S1 as follows:   T11-T12: This is on sagittal views only.  There is disc bulging causing mild right foraminal narrowing but no spinal stenosis or nerve root compression.   T12-L1: This level is unremarkable.   L1-L2: There is broad disc bulging and mild right facet hypertrophy causing minimal foraminal narrowing and mild lateral recess stenosis but no spinal stenosis or nerve root compression.   L2-L3: There is broad disc bulging, facet hypertrophy and ligamenta flava hypertrophy causing minimal foraminal narrowing, mild bilateral lateral recess stenosis but no spinal stenosis or nerve root compression.   L3-L4: There is disc bulging, facet hypertrophy and ligamenta flava hypertrophy causing mild spinal stenosis, mild foraminal narrowing and mild to moderate bilateral lateral recess stenosis but no nerve root compression.   L4-L5: There is broad disc herniation, advanced facet hypertrophy, small joint effusions and ligamenta flava hypertrophy causing very severe spinal stenosis (AP of 5.4 mm and transverse diameter  less than 6 mm) moderate left foraminal narrowing, moderately severe right foraminal narrowing and severe bilateral lateral recess stenosis.  This could lead to right L4 and bilateral L5 nerve root compression.   L5-S1: There is central disc protrusion, facet hypertrophy and small joint effusion causing severe right, moderately severe left foraminal narrowing, moderately severe right and moderate left lateral recess stenosis and mild spinal stenosis in the transverse diameter.  There is potential for bilateral L5 and right S1 nerve root compression.   IMPRESSION: This MRI of the lumbar spine without contrast shows the following: At L4-L5, there is broad disc herniation and advanced facet hypertrophy and other degenerative  change causing very severe spinal stenosis with an AP diameter of 5.4 mm.  There is also moderately severe right foraminal narrowing and severe bilateral lateral recess stenosis which could lead to right L4 and bilateral L5 nerve root compression. At L5-S1, there are degenerative changes causing severe right foraminal narrowing and moderately severe left foraminal narrowing.  There is also moderately severe right and moderate left lateral recess stenosis.  There is mild spinal stenosis and potential for bilateral L5 right S1 nerve root compression. Milder degenerative changes at the other lumbar levels as detailed above not causing significant spinal stenosis or nerve root compression       INTERPRETING PHYSICIAN:  Richard A. Vear, MD, PhD, FAAN Certified in  Neuroimaging by [employer]  I have personally reviewed the images and agree with the above interpretation.   EMG of bilateral upper and lower extremities dated 12/05/23:  Conclusion: This is an  abnormal study.  There is electrodiagnostic evidence of moderately severe peripheral neuropathy with superimposed bilateral carpal tunnel syndromes.  There is also evidence of chronic bilateral cervical radiculopathy involving bilateral C5, 6, 7 T1 myotomes, also chronic left L4-5 S1 radiculopathy.       ------------------------------- Modena Callander. M.D. Ph.D.   Assessment and Plan: Mr. Frady has a primary complaint of not being able to walk any significant distance due to pain.   He has constant LBP with intermittent bilateral posterior leg pain to his knee and sometimes to his feet. LBP > leg pain, right leg pain = left leg pain. Pain is worse with standing and walking. Grocery cart helps.   He has known mild central stenosis L3-L4 and L5-S1 along with severe central stenosis L4-L5. He has multilevel foraminal stenosis L4-S1 as well.   EMG showed moderately severe peripheral neuropathy with chronic left L4-5 S1  radiculopathy.  He has constant neck pain that is more of a stiffness that radiates into his left shoulder. No arm pain. He has numbness/tingling in his hands that is constant. He drops things and has some dexterity issues due to numbness in fingers.   He has known multilevel foraminal stenosis in cervical spine. He has severe central stenosis C4-C5, moderate/severe central stenosis C5-C6, and moderate central stenosis C6-C7.   EMG showed severe peripheral neuropathy with superimposed bilateral carpal tunnel syndrome along with chronic bilateral cervical radiculopathy involving bilateral C5, 6, 7 T1 myotomes.   He also has left shoulder pain with limited ROM- this is likely left shoulder mediated.   Treatment options discussed with patient and following plan made:   - Cervical and lumbar xrays with flex/ext done on his way out today. Dr. Claudene will review with him at his follow up.  - Recommend he follow up with Dr. Claudene for evaluation of severe central stenosis in cervical spine.  - Discussed with patient that we may need to address his cervical spine prior to addressing lumbar spine.  - Symptoms are complicated by known peripheral neuropathy, cervical/lumbar radiculopathy, and bilateral carpal tunnel syndrome.  - He is not interested in any further injections in his lumbar spine. He may need to revisit PT, but will discuss at his follow up with Dr. Claudene.   I spent a total of 45 minutes in face-to-face and non-face-to-face activities related to this patient's care today including review of outside records, review of imaging, review of symptoms, physical exam, discussion of differential diagnosis, discussion of treatment options, and documentation.   Thank you for involving me in the care of this patient.   ADDENDUM 1/231/26:  Notes from [facility] scanned into chart. He had following injections:  05/16/22- MBB bilateral L3-L5 05/02/22- MBB bilateral L3-L5 09/27/21- Intra-articular  steroid injection bilateral L4-5 and L5-S1   [facility] PA-C Dept. of Neurosurgery  "

## 2024-02-25 ENCOUNTER — Ambulatory Visit: Admitting: Neurosurgery

## 2024-03-26 ENCOUNTER — Ambulatory Visit: Admitting: Neurosurgery

## 2024-05-06 ENCOUNTER — Ambulatory Visit: Admitting: Nurse Practitioner
